# Patient Record
Sex: Female | Born: 1950 | Race: White | Hispanic: No | Marital: Married | Smoking: Never smoker
Health system: Southern US, Community
[De-identification: ages and names within clinical notes are randomized; demographics above are authoritative.]

## PROBLEM LIST (undated history)

## (undated) DIAGNOSIS — N200 Calculus of kidney: Secondary | ICD-10-CM

---

## 2021-08-09 ENCOUNTER — Observation Stay (HOSPITAL_COMMUNITY): Payer: Medicare Other | Admitting: Anesthesiology

## 2021-08-09 ENCOUNTER — Other Ambulatory Visit: Payer: Self-pay

## 2021-08-09 ENCOUNTER — Inpatient Hospital Stay (HOSPITAL_COMMUNITY)
Admission: EM | Admit: 2021-08-09 | Discharge: 2021-08-19 | DRG: 330 | Disposition: A | Discharge status: Home / Self Care | Payer: Medicare Other | Attending: General Surgery | Admitting: General Surgery

## 2021-08-09 ENCOUNTER — Emergency Department (HOSPITAL_COMMUNITY): Payer: Medicare Other

## 2021-08-09 ENCOUNTER — Encounter (HOSPITAL_COMMUNITY): Payer: Self-pay

## 2021-08-09 ENCOUNTER — Encounter (HOSPITAL_COMMUNITY): Admission: EM | Disposition: A | Discharge status: Home / Self Care | Payer: Self-pay | Source: Home / Self Care

## 2021-08-09 DIAGNOSIS — K3580 Unspecified acute appendicitis: Secondary | ICD-10-CM | POA: Diagnosis present

## 2021-08-09 DIAGNOSIS — I10 Essential (primary) hypertension: Secondary | ICD-10-CM | POA: Diagnosis present

## 2021-08-09 DIAGNOSIS — Z79899 Other long term (current) drug therapy: Secondary | ICD-10-CM | POA: Diagnosis not present

## 2021-08-09 DIAGNOSIS — N179 Acute kidney failure, unspecified: Secondary | ICD-10-CM | POA: Diagnosis present

## 2021-08-09 DIAGNOSIS — R9389 Abnormal findings on diagnostic imaging of other specified body structures: Secondary | ICD-10-CM | POA: Diagnosis present

## 2021-08-09 DIAGNOSIS — G4733 Obstructive sleep apnea (adult) (pediatric): Secondary | ICD-10-CM | POA: Diagnosis present

## 2021-08-09 DIAGNOSIS — Z87442 Personal history of urinary calculi: Secondary | ICD-10-CM | POA: Diagnosis not present

## 2021-08-09 DIAGNOSIS — K3532 Acute appendicitis with perforation and localized peritonitis, without abscess: Secondary | ICD-10-CM

## 2021-08-09 DIAGNOSIS — R911 Solitary pulmonary nodule: Secondary | ICD-10-CM | POA: Diagnosis present

## 2021-08-09 DIAGNOSIS — R159 Full incontinence of feces: Secondary | ICD-10-CM | POA: Diagnosis present

## 2021-08-09 DIAGNOSIS — Z20822 Contact with and (suspected) exposure to covid-19: Secondary | ICD-10-CM | POA: Diagnosis present

## 2021-08-09 DIAGNOSIS — Z5331 Laparoscopic surgical procedure converted to open procedure: Secondary | ICD-10-CM

## 2021-08-09 DIAGNOSIS — I700 Atherosclerosis of aorta: Secondary | ICD-10-CM | POA: Diagnosis present

## 2021-08-09 DIAGNOSIS — K5720 Diverticulitis of large intestine with perforation and abscess without bleeding: Secondary | ICD-10-CM | POA: Diagnosis present

## 2021-08-09 DIAGNOSIS — N739 Female pelvic inflammatory disease, unspecified: Secondary | ICD-10-CM | POA: Diagnosis not present

## 2021-08-09 DIAGNOSIS — K660 Peritoneal adhesions (postprocedural) (postinfection): Secondary | ICD-10-CM | POA: Diagnosis not present

## 2021-08-09 DIAGNOSIS — K5641 Fecal impaction: Secondary | ICD-10-CM | POA: Diagnosis present

## 2021-08-09 DIAGNOSIS — D62 Acute posthemorrhagic anemia: Secondary | ICD-10-CM | POA: Diagnosis not present

## 2021-08-09 DIAGNOSIS — K352 Acute appendicitis with generalized peritonitis, without abscess: Secondary | ICD-10-CM | POA: Diagnosis not present

## 2021-08-09 DIAGNOSIS — R188 Other ascites: Secondary | ICD-10-CM

## 2021-08-09 DIAGNOSIS — R109 Unspecified abdominal pain: Secondary | ICD-10-CM | POA: Diagnosis present

## 2021-08-09 HISTORY — DX: Calculus of kidney: N20.0

## 2021-08-09 HISTORY — PX: LAPAROSCOPIC APPENDECTOMY: SHX408

## 2021-08-09 LAB — COMPREHENSIVE METABOLIC PANEL
ALT: 14 U/L (ref 0–44)
AST: 16 U/L (ref 15–41)
Albumin: 3.6 g/dL (ref 3.5–5.0)
Alkaline Phosphatase: 87 U/L (ref 38–126)
Anion gap: 5 (ref 5–15)
BUN: 14 mg/dL (ref 8–23)
CO2: 25 mmol/L (ref 22–32)
Calcium: 9.4 mg/dL (ref 8.9–10.3)
Chloride: 104 mmol/L (ref 98–111)
Creatinine, Ser: 0.81 mg/dL (ref 0.44–1.00)
GFR, Estimated: >60 mL/min (ref >60)
Glucose, Bld: 174 mg/dL — ABNORMAL HIGH (ref 70–99)
Potassium: 3.7 mmol/L (ref 3.5–5.1)
Sodium: 134 mmol/L — ABNORMAL LOW (ref 135–145)
Total Bilirubin: 0.6 mg/dL (ref 0.3–1.2)
Total Protein: 6.9 g/dL (ref 6.5–8.1)

## 2021-08-09 LAB — URINALYSIS, ROUTINE W REFLEX MICROSCOPIC
Bilirubin Urine: NEGATIVE
Glucose, UA: NEGATIVE mg/dL
Hgb urine dipstick: NEGATIVE
Ketones, ur: NEGATIVE mg/dL
Nitrite: NEGATIVE
Protein, ur: 30 mg/dL — AB
Specific Gravity, Urine: >1.046 — ABNORMAL HIGH (ref 1.005–1.030)
pH: 8 (ref 5.0–8.0)

## 2021-08-09 LAB — CBC WITH DIFFERENTIAL/PLATELET
Abs Immature Granulocytes: 0.07 10*3/uL (ref 0.00–0.07)
Basophils Absolute: 0 10*3/uL (ref 0.0–0.1)
Basophils Relative: 0 %
Eosinophils Absolute: 0 10*3/uL (ref 0.0–0.5)
Eosinophils Relative: 0 %
HCT: 40.3 % (ref 36.0–46.0)
Hemoglobin: 12.5 g/dL (ref 12.0–15.0)
Immature Granulocytes: 1 %
Lymphocytes Relative: 9 %
Lymphs Abs: 1.2 10*3/uL (ref 0.7–4.0)
MCH: 24.1 pg — ABNORMAL LOW (ref 26.0–34.0)
MCHC: 31 g/dL (ref 30.0–36.0)
MCV: 77.6 fL — ABNORMAL LOW (ref 80.0–100.0)
Monocytes Absolute: 0.7 10*3/uL (ref 0.1–1.0)
Monocytes Relative: 5 %
Neutro Abs: 11.9 10*3/uL — ABNORMAL HIGH (ref 1.7–7.7)
Neutrophils Relative %: 85 %
Platelets: 453 10*3/uL — ABNORMAL HIGH (ref 150–400)
RBC: 5.19 MIL/uL — ABNORMAL HIGH (ref 3.87–5.11)
RDW: 15.3 % (ref 11.5–15.5)
WBC: 14 10*3/uL — ABNORMAL HIGH (ref 4.0–10.5)
nRBC: 0 % (ref 0.0–0.2)

## 2021-08-09 LAB — GLUCOSE, CAPILLARY: Glucose-Capillary: 150 mg/dL — ABNORMAL HIGH (ref 70–99)

## 2021-08-09 LAB — RESP PANEL BY RT-PCR (FLU A&B, COVID) ARPGX2
Influenza A by PCR: NEGATIVE
Influenza B by PCR: NEGATIVE
SARS Coronavirus 2 by RT PCR: NEGATIVE

## 2021-08-09 LAB — HIV ANTIBODY (ROUTINE TESTING W REFLEX): HIV Screen 4th Generation wRfx: NONREACTIVE

## 2021-08-09 SURGERY — APPENDECTOMY, LAPAROSCOPIC
Anesthesia: General | Site: Abdomen

## 2021-08-09 MED ORDER — ACETAMINOPHEN 500 MG PO TABS
1000.0000 mg | ORAL_TABLET | Freq: Four times a day (QID) | ORAL | Status: DC
  Administered 2021-08-09: 1000 mg via ORAL
  Filled 2021-08-09: qty 2

## 2021-08-09 MED ORDER — ACETAMINOPHEN 500 MG PO TABS
1000.0000 mg | ORAL_TABLET | Freq: Four times a day (QID) | ORAL | Status: DC
  Administered 2021-08-10 – 2021-08-19 (×23): 1000 mg via ORAL
  Filled 2021-08-09 (×25): qty 2

## 2021-08-09 MED ORDER — HYDROMORPHONE HCL 1 MG/ML IJ SOLN: 0.5000 mg | INTRAMUSCULAR | Status: DC | PRN

## 2021-08-09 MED ORDER — HYDROMORPHONE HCL 1 MG/ML IJ SOLN
INTRAMUSCULAR | Status: AC
  Administered 2021-08-09: 0.25 mg via INTRAVENOUS
  Filled 2021-08-09: qty 1

## 2021-08-09 MED ORDER — ENOXAPARIN SODIUM 40 MG/0.4ML IJ SOSY: 40.0000 mg | PREFILLED_SYRINGE | INTRAMUSCULAR | Status: DC

## 2021-08-09 MED ORDER — ONDANSETRON 4 MG PO TBDP: 4.0000 mg | ORAL_TABLET | Freq: Four times a day (QID) | ORAL | Status: DC | PRN

## 2021-08-09 MED ORDER — PROPOFOL 10 MG/ML IV BOLUS
INTRAVENOUS | Status: DC | PRN
Start: 2021-08-09 — End: 2021-08-09
  Administered 2021-08-09: 150 mg via INTRAVENOUS

## 2021-08-09 MED ORDER — MEPERIDINE HCL 50 MG/ML IJ SOLN: 6.2500 mg | INTRAMUSCULAR | Status: DC | PRN

## 2021-08-09 MED ORDER — KCL IN DEXTROSE-NACL 20-5-0.45 MEQ/L-%-% IV SOLN
INTRAVENOUS | Status: DC
  Filled 2021-08-09: qty 1000

## 2021-08-09 MED ORDER — HYDROMORPHONE HCL 1 MG/ML IJ SOLN
0.2500 mg | INTRAMUSCULAR | Status: DC | PRN
  Administered 2021-08-09: 0.25 mg via INTRAVENOUS

## 2021-08-09 MED ORDER — LACTATED RINGERS IV SOLN: INTRAVENOUS | Status: DC

## 2021-08-09 MED ORDER — ONDANSETRON HCL 4 MG/2ML IJ SOLN: 4.0000 mg | Freq: Four times a day (QID) | INTRAMUSCULAR | Status: DC | PRN

## 2021-08-09 MED ORDER — ACETAMINOPHEN 10 MG/ML IV SOLN
INTRAVENOUS | Status: DC | PRN
  Administered 2021-08-09: 1000 mg via INTRAVENOUS

## 2021-08-09 MED ORDER — IBUPROFEN 200 MG PO TABS: 400.0000 mg | ORAL_TABLET | Freq: Four times a day (QID) | ORAL | Status: DC | PRN

## 2021-08-09 MED ORDER — SODIUM CHLORIDE 0.9 % IV BOLUS
1000.0000 mL | Freq: Once | INTRAVENOUS | Status: AC
  Administered 2021-08-09: 1000 mL via INTRAVENOUS

## 2021-08-09 MED ORDER — MELATONIN 3 MG PO TABS: 3.0000 mg | ORAL_TABLET | Freq: Every evening | ORAL | Status: DC | PRN

## 2021-08-09 MED ORDER — MIDAZOLAM HCL 2 MG/2ML IJ SOLN
INTRAMUSCULAR | Status: AC
  Filled 2021-08-09: qty 2

## 2021-08-09 MED ORDER — OXYCODONE HCL 5 MG PO TABS: 5.0000 mg | ORAL_TABLET | Freq: Once | ORAL | Status: DC | PRN

## 2021-08-09 MED ORDER — ONDANSETRON HCL 4 MG/2ML IJ SOLN
4.0000 mg | Freq: Once | INTRAMUSCULAR | Status: AC
  Administered 2021-08-09: 4 mg via INTRAVENOUS
  Filled 2021-08-09: qty 2

## 2021-08-09 MED ORDER — IBUPROFEN 200 MG PO TABS
600.0000 mg | ORAL_TABLET | Freq: Four times a day (QID) | ORAL | Status: DC | PRN
  Administered 2021-08-10: 600 mg via ORAL
  Filled 2021-08-09 (×2): qty 3

## 2021-08-09 MED ORDER — DIPHENHYDRAMINE HCL 12.5 MG/5ML PO ELIX: 12.5000 mg | ORAL_SOLUTION | Freq: Four times a day (QID) | ORAL | Status: DC | PRN

## 2021-08-09 MED ORDER — MORPHINE SULFATE (PF) 4 MG/ML IV SOLN
4.0000 mg | Freq: Once | INTRAVENOUS | Status: AC
  Administered 2021-08-09: 4 mg via INTRAVENOUS
  Filled 2021-08-09: qty 1

## 2021-08-09 MED ORDER — ACETAMINOPHEN 10 MG/ML IV SOLN
INTRAVENOUS | Status: AC
  Filled 2021-08-09: qty 100

## 2021-08-09 MED ORDER — MIDAZOLAM HCL 2 MG/2ML IJ SOLN: 0.5000 mg | Freq: Once | INTRAMUSCULAR | Status: DC | PRN

## 2021-08-09 MED ORDER — BUPIVACAINE-EPINEPHRINE 0.25% -1:200000 IJ SOLN
INTRAMUSCULAR | Status: DC | PRN
Start: 2021-08-09 — End: 2021-08-09
  Administered 2021-08-09: 20 mL

## 2021-08-09 MED ORDER — SIMETHICONE 80 MG PO CHEW
40.0000 mg | CHEWABLE_TABLET | Freq: Four times a day (QID) | ORAL | Status: DC | PRN
  Administered 2021-08-10: 40 mg via ORAL
  Filled 2021-08-09: qty 1

## 2021-08-09 MED ORDER — IOHEXOL 300 MG/ML  SOLN
100.0000 mL | Freq: Once | INTRAMUSCULAR | Status: AC | PRN
  Administered 2021-08-09: 100 mL via INTRAVENOUS

## 2021-08-09 MED ORDER — PROMETHAZINE HCL 25 MG/ML IJ SOLN: 6.2500 mg | INTRAMUSCULAR | Status: DC | PRN

## 2021-08-09 MED ORDER — SODIUM CHLORIDE (PF) 0.9 % IJ SOLN
INTRAMUSCULAR | Status: AC
  Filled 2021-08-09: qty 50

## 2021-08-09 MED ORDER — ROCURONIUM BROMIDE 10 MG/ML (PF) SYRINGE
PREFILLED_SYRINGE | INTRAVENOUS | Status: DC | PRN
Start: 2021-08-09 — End: 2021-08-09
  Administered 2021-08-09: 50 mg via INTRAVENOUS
  Administered 2021-08-09: 20 mg via INTRAVENOUS

## 2021-08-09 MED ORDER — 0.9 % SODIUM CHLORIDE (POUR BTL) OPTIME
TOPICAL | Status: DC | PRN
  Administered 2021-08-09: 3000 mL

## 2021-08-09 MED ORDER — SUGAMMADEX SODIUM 200 MG/2ML IV SOLN
INTRAVENOUS | Status: DC | PRN
  Administered 2021-08-09: 200 mg via INTRAVENOUS

## 2021-08-09 MED ORDER — OXYCODONE HCL 5 MG PO TABS: 5.0000 mg | ORAL_TABLET | Freq: Four times a day (QID) | ORAL | Status: DC | PRN

## 2021-08-09 MED ORDER — PHENYLEPHRINE HCL-NACL 20-0.9 MG/250ML-% IV SOLN
INTRAVENOUS | Status: AC
  Filled 2021-08-09: qty 250

## 2021-08-09 MED ORDER — HEPARIN SODIUM (PORCINE) 5000 UNIT/ML IJ SOLN
5000.0000 [IU] | Freq: Three times a day (TID) | INTRAMUSCULAR | Status: DC
  Administered 2021-08-10 – 2021-08-17 (×22): 5000 [IU] via SUBCUTANEOUS
  Filled 2021-08-09 (×22): qty 1

## 2021-08-09 MED ORDER — DIPHENHYDRAMINE HCL 50 MG/ML IJ SOLN: 12.5000 mg | Freq: Four times a day (QID) | INTRAMUSCULAR | Status: DC | PRN

## 2021-08-09 MED ORDER — BUPIVACAINE-EPINEPHRINE (PF) 0.25% -1:200000 IJ SOLN
INTRAMUSCULAR | Status: AC
  Filled 2021-08-09: qty 30

## 2021-08-09 MED ORDER — METOPROLOL SUCCINATE ER 50 MG PO TB24
50.0000 mg | ORAL_TABLET | Freq: Every morning | ORAL | Status: DC
  Administered 2021-08-09: 50 mg via ORAL
  Filled 2021-08-09: qty 1

## 2021-08-09 MED ORDER — OXYCODONE HCL 5 MG PO TABS: 5.0000 mg | ORAL_TABLET | ORAL | Status: DC | PRN

## 2021-08-09 MED ORDER — OXYCODONE HCL 5 MG/5ML PO SOLN: 5.0000 mg | Freq: Once | ORAL | Status: DC | PRN

## 2021-08-09 MED ORDER — TRAMADOL HCL 50 MG PO TABS
50.0000 mg | ORAL_TABLET | Freq: Four times a day (QID) | ORAL | Status: DC | PRN
  Administered 2021-08-12 – 2021-08-15 (×8): 50 mg via ORAL
  Filled 2021-08-09 (×9): qty 1

## 2021-08-09 MED ORDER — PHENYLEPHRINE HCL-NACL 20-0.9 MG/250ML-% IV SOLN
INTRAVENOUS | Status: DC | PRN
  Administered 2021-08-09: 50 ug/min via INTRAVENOUS

## 2021-08-09 MED ORDER — CHLORHEXIDINE GLUCONATE 0.12 % MT SOLN
15.0000 mL | Freq: Once | OROMUCOSAL | Status: AC
  Administered 2021-08-09: 15 mL via OROMUCOSAL

## 2021-08-09 MED ORDER — HYDRALAZINE HCL 20 MG/ML IJ SOLN: 10.0000 mg | INTRAMUSCULAR | Status: DC | PRN

## 2021-08-09 MED ORDER — LISINOPRIL 10 MG PO TABS
40.0000 mg | ORAL_TABLET | Freq: Every morning | ORAL | Status: DC
  Administered 2021-08-09: 40 mg via ORAL
  Filled 2021-08-09: qty 4

## 2021-08-09 MED ORDER — PIPERACILLIN-TAZOBACTAM 3.375 G IVPB
3.3750 g | Freq: Three times a day (TID) | INTRAVENOUS | Status: DC
  Filled 2021-08-09: qty 50

## 2021-08-09 MED ORDER — MORPHINE SULFATE (PF) 2 MG/ML IV SOLN
2.0000 mg | INTRAVENOUS | Status: DC | PRN
  Administered 2021-08-09: 2 mg via INTRAVENOUS
  Filled 2021-08-09: qty 1

## 2021-08-09 MED ORDER — HYDRALAZINE HCL 20 MG/ML IJ SOLN: 10.0000 mg | Freq: Three times a day (TID) | INTRAMUSCULAR | Status: DC | PRN

## 2021-08-09 MED ORDER — PIPERACILLIN-TAZOBACTAM 3.375 G IVPB 30 MIN
3.3750 g | Freq: Once | INTRAVENOUS | Status: AC
  Administered 2021-08-09: 3.375 g via INTRAVENOUS
  Filled 2021-08-09: qty 50

## 2021-08-09 MED ORDER — METHOCARBAMOL 500 MG PO TABS
500.0000 mg | ORAL_TABLET | Freq: Four times a day (QID) | ORAL | Status: DC | PRN
  Filled 2021-08-09: qty 1

## 2021-08-09 MED ORDER — LIDOCAINE 2% (20 MG/ML) 5 ML SYRINGE: INTRAMUSCULAR | Status: DC | PRN

## 2021-08-09 MED ORDER — FENTANYL CITRATE (PF) 100 MCG/2ML IJ SOLN
INTRAMUSCULAR | Status: DC | PRN
  Administered 2021-08-09: 50 ug via INTRAVENOUS
  Administered 2021-08-09: 100 ug via INTRAVENOUS
  Administered 2021-08-09 (×2): 50 ug via INTRAVENOUS

## 2021-08-09 MED ORDER — FENTANYL CITRATE (PF) 250 MCG/5ML IJ SOLN
INTRAMUSCULAR | Status: AC
  Filled 2021-08-09: qty 5

## 2021-08-09 MED ORDER — CHLORHEXIDINE GLUCONATE CLOTH 2 % EX PADS: 6.0000 | MEDICATED_PAD | Freq: Every day | CUTANEOUS | Status: DC

## 2021-08-09 MED ORDER — ONDANSETRON HCL 4 MG/2ML IJ SOLN
INTRAMUSCULAR | Status: DC | PRN
  Administered 2021-08-09: 4 mg via INTRAVENOUS

## 2021-08-09 MED ORDER — METOPROLOL TARTRATE 5 MG/5ML IV SOLN: 5.0000 mg | Freq: Four times a day (QID) | INTRAVENOUS | Status: DC | PRN

## 2021-08-09 MED ORDER — SUCCINYLCHOLINE CHLORIDE 200 MG/10ML IV SOSY
PREFILLED_SYRINGE | INTRAVENOUS | Status: DC | PRN
  Administered 2021-08-09: 120 mg via INTRAVENOUS

## 2021-08-09 MED ORDER — LISINOPRIL 10 MG PO TABS: 40.0000 mg | ORAL_TABLET | Freq: Every morning | ORAL | Status: DC

## 2021-08-09 MED ORDER — DOCUSATE SODIUM 100 MG PO CAPS: 100.0000 mg | ORAL_CAPSULE | Freq: Two times a day (BID) | ORAL | Status: DC

## 2021-08-09 SURGICAL SUPPLY — 67 items
APPLIER CLIP 5 13 M/L LIGAMAX5 (MISCELLANEOUS)
APPLIER CLIP ROT 10 11.4 M/L (STAPLE)
BAG COUNTER SPONGE SURGICOUNT (BAG) IMPLANT
CABLE HIGH FREQUENCY MONO STRZ (ELECTRODE) IMPLANT
CHLORAPREP W/TINT 26 (MISCELLANEOUS) ×2 IMPLANT
CLIP APPLIE 5 13 M/L LIGAMAX5 (MISCELLANEOUS) IMPLANT
CLIP APPLIE ROT 10 11.4 M/L (STAPLE) IMPLANT
COVER SURGICAL LIGHT HANDLE (MISCELLANEOUS) ×2 IMPLANT
CUTTER FLEX LINEAR 45M (STAPLE) IMPLANT
DERMABOND ADVANCED (GAUZE/BANDAGES/DRESSINGS) ×1
DERMABOND ADVANCED .7 DNX12 (GAUZE/BANDAGES/DRESSINGS) ×1 IMPLANT
DRAIN CHANNEL 19F RND (DRAIN) ×1 IMPLANT
DRSG PAD ABDOMINAL 8X10 ST (GAUZE/BANDAGES/DRESSINGS) ×2 IMPLANT
DRSG TELFA 3X8 NADH (GAUZE/BANDAGES/DRESSINGS) ×4 IMPLANT
ELECT REM PT RETURN 15FT ADLT (MISCELLANEOUS) ×2 IMPLANT
ENDOLOOP SUT PDS II  0 18 (SUTURE)
ENDOLOOP SUT PDS II 0 18 (SUTURE) IMPLANT
EVACUATOR DRAINAGE 10X20 100CC (DRAIN) IMPLANT
EVACUATOR SILICONE 100CC (DRAIN) ×1 IMPLANT
GAUZE SPONGE 4X4 12PLY STRL (GAUZE/BANDAGES/DRESSINGS) ×1 IMPLANT
GLOVE SURG ENC MOIS LTX SZ6 (GLOVE) ×2 IMPLANT
GLOVE SURG MICRO LTX SZ6 (GLOVE) ×2 IMPLANT
GLOVE SURG UNDER LTX SZ6.5 (GLOVE) ×2 IMPLANT
GOWN STRL REUS W/ TWL LRG LVL3 (GOWN DISPOSABLE) ×1 IMPLANT
GOWN STRL REUS W/TWL LRG LVL3 (GOWN DISPOSABLE) ×1
GOWN STRL REUS W/TWL XL LVL3 (GOWN DISPOSABLE) ×2 IMPLANT
GRASPER SUT TROCAR 14GX15 (MISCELLANEOUS) IMPLANT
HANDLE SUCTION POOLE (INSTRUMENTS) IMPLANT
IRRIG SUCT STRYKERFLOW 2 WTIP (MISCELLANEOUS) ×2
IRRIGATION SUCT STRKRFLW 2 WTP (MISCELLANEOUS) ×1 IMPLANT
KIT BASIN OR (CUSTOM PROCEDURE TRAY) ×2 IMPLANT
KIT TURNOVER KIT A (KITS) IMPLANT
NDL INSUFFLATION 14GA 120MM (NEEDLE) ×1 IMPLANT
NEEDLE INSUFFLATION 14GA 120MM (NEEDLE) ×2 IMPLANT
PAD DRESSING TELFA 3X8 NADH (GAUZE/BANDAGES/DRESSINGS) IMPLANT
POUCH SPECIMEN RETRIEVAL 10MM (ENDOMECHANICALS) ×2 IMPLANT
RELOAD 45 VASCULAR/THIN (ENDOMECHANICALS) IMPLANT
RELOAD PROXIMATE 75MM BLUE (ENDOMECHANICALS) ×2 IMPLANT
RELOAD STAPLE 45 2.5 WHT GRN (ENDOMECHANICALS) IMPLANT
RELOAD STAPLE 45 3.5 BLU ETS (ENDOMECHANICALS) IMPLANT
RELOAD STAPLE 75 3.8 BLU REG (ENDOMECHANICALS) IMPLANT
RELOAD STAPLE TA45 3.5 REG BLU (ENDOMECHANICALS) IMPLANT
RETRACTOR WND ALEXIS 25 LRG (MISCELLANEOUS) IMPLANT
RTRCTR WOUND ALEXIS 25CM LRG (MISCELLANEOUS) ×2
SCISSORS LAP 5X35 DISP (ENDOMECHANICALS) IMPLANT
SEALER TISSUE G2 STRG ARTC 35C (ENDOMECHANICALS) ×1 IMPLANT
SET TUBE SMOKE EVAC HIGH FLOW (TUBING) ×2 IMPLANT
SHEARS HARMONIC ACE PLUS 36CM (ENDOMECHANICALS) ×2 IMPLANT
SLEEVE XCEL OPT CAN 5 100 (ENDOMECHANICALS) ×2 IMPLANT
SPIKE FLUID TRANSFER (MISCELLANEOUS) ×2 IMPLANT
STAPLER 90 3.5 STAND SLIM (STAPLE) ×2
STAPLER 90 3.5 STD SLIM (STAPLE) IMPLANT
STAPLER PROXIMATE 75MM BLUE (STAPLE) ×2 IMPLANT
STAPLER VISISTAT 35W (STAPLE) ×1 IMPLANT
SUCTION POOLE HANDLE (INSTRUMENTS) ×2
SUT ETHILON 2 0 PS N (SUTURE) ×1 IMPLANT
SUT MNCRL AB 4-0 PS2 18 (SUTURE) ×2 IMPLANT
SUT PDS AB 0 CTX 36 PDP370T (SUTURE) ×1 IMPLANT
TAPE CLOTH SURG 6X10 WHT LF (GAUZE/BANDAGES/DRESSINGS) ×1 IMPLANT
TOWEL OR 17X26 10 PK STRL BLUE (TOWEL DISPOSABLE) ×2 IMPLANT
TOWEL OR NON WOVEN STRL DISP B (DISPOSABLE) ×2 IMPLANT
TRAY FOLEY MTR SLVR 14FR STAT (SET/KITS/TRAYS/PACK) IMPLANT
TRAY FOLEY MTR SLVR 16FR STAT (SET/KITS/TRAYS/PACK) IMPLANT
TRAY LAPAROSCOPIC (CUSTOM PROCEDURE TRAY) ×2 IMPLANT
TROCAR BLADELESS OPT 5 100 (ENDOMECHANICALS) ×2 IMPLANT
TROCAR XCEL 12X100 BLDLESS (ENDOMECHANICALS) ×2 IMPLANT
TROCAR XCEL BLUNT TIP 100MML (ENDOMECHANICALS) ×1 IMPLANT

## 2021-08-09 NOTE — Progress Notes (Signed)
Pharmacy Note   A consult was received from an ED physician for Zosyn per pharmacy dosing.    The patient's profile has been reviewed for ht/wt/allergies/indication/available labs.    A one time order has been placed for Zosyn 3.375 gr IV x1 .    Further antibiotics/pharmacy consults should be ordered by admitting physician if indicated.                       Thank you,  Adalberto Cole, PharmD, BCPS 08/09/2021 11:08 AM

## 2021-08-09 NOTE — Transfer of Care (Signed)
Immediate Anesthesia Transfer of Care Note  Patient: Heidi Peterson  Procedure(s) Performed: LAPAROSCOPIC CONVERTED TO OPEN ILEOSEECTOMY (Abdomen)  Patient Location: PACU  Anesthesia Type:General  Level of Consciousness: drowsy and patient cooperative  Airway & Oxygen Therapy: Patient Spontanous Breathing and Patient connected to face mask oxygen  Post-op Assessment: Report given to RN and Post -op Vital signs reviewed and stable  Post vital signs: Reviewed and stable  Last Vitals:  Vitals Value Taken Time  BP 111/47 08/09/21 2024  Temp    Pulse 81 08/09/21 2029  Resp 17 08/09/21 2029  SpO2 98 % 08/09/21 2029  Vitals shown include unvalidated device data.  Last Pain:  Vitals:   08/09/21 1700  TempSrc: Oral  PainSc:          Complications: No notable events documented.

## 2021-08-09 NOTE — Op Note (Signed)
Heidi Peterson [id number]   PRE-OPERATIVE DIAGNOSIS:  Acute appendicitis, possible perforation  POST-OPERATIVE DIAGNOSIS:  Acute perforated appendicitis with feculent peritonitis   PROCEDURE:  Open ileocecectomy with ileocolic anastomosis Diagnostic laparoscopy  SURGEON:  Sharon [place] Harl, M.D.  ASSISTANT: OR staff  ANESTHESIA: General endotracheal  EBL:   50 mL  DRAINS: 22 Fr round blake drain left draining RLQ  SPECIMEN:  Terminal ileum, cecum with perforated appendix - all en bloc  COUNTS:  Sponge, needle and instrument counts were reported correct x2 at conclusion of the operation  DISPOSITION:  PACU in satisfactory condition  COMPLICATIONS: None  FINDINGS: Acute perforated appendicitis with feculent peritonitis.  There is a large gangrenous perforation at the base of the appendix resting upon the ileocecal valve.  This was therefore not amenable to appendectomy alone.  An ileocecectomy was necessary.  At least 10 free-floating fecaliths were found throughout the abdomen along with purulent and feculent peritoneal fluid.  DESCRIPTION:  The patient was identified & brought into the operating room. SCDs were in place and functioning. General endotracheal anesthesia was administered. Preoperative antibiotics were administered. The patient was positioned supine with left arm tucked. Hair on the abdomen was then clipped by the OR team. A foley catheter was inserted under sterile conditions. The abdomen was prepped and draped in the standard sterile fashion. A surgical timeout confirmed our plan.  A small incision was made in the supraumbilical skin. The subcutaneous tissue was dissected and the umbilical stalk identified. The stalk was grasped with a Kocher and retracted outwardly. The supraumbilical fascia was exposed and incised. Peritoneal entry was carefully made bluntly. A 0 Vicryl purse-string suture was placed and then the [facility] port was introduced into the  abdomen.  CO2 insufflation commenced to 69mmHg. The laparoscope was inserted and confirmed no evidence of trocar site complications. The patient was then positioned in Trendelenburg. Two additional ports were placed - one in left lower quadrant and another in the suprapubic midline taking care to stay well above the bladder - 3 fingerbreadths above the pubic symphysis. The bed was then slightly tilted to place the left side down.  It was immediately apparent that there is both feculent and purulent peritonitis with numerous loops of bowel with inflammatory type adhesions to the abdominal wall and additionally omentum mildly adherent with inflammatory lesions.  These were all able to be carefully swept down bluntly without any difficulty.  There are multiple free-floating subcentimeter appendicoliths throughout the abdomen.  There is both purulent and feculent fluid in the pelvis, right lower quadrant, left lower quadrant, right upper quadrant, left upper quadrant.  Much of this is evacuated with the suction irrigator device.  There is thin stool in the right lower quadrant that is green in color.  Upon entering the abdomen (organ space), I encountered feculent peritonitis.  CASE DATA:  Type of patient?: LDOW CASE (Surgical Hospitalist [facility] Inpatient)  Status of Case? EMERGENT Add On  Infection Present At Time Of Surgery (PATOS)?  FECULENT PERITONITIS   There is frank gangrene at the base of the appendix with associated inflammation and induration of the cecal wall.  This location overlies the ileocecal valve.  We are able to mobilize the appendix bluntly from its partially retrocecal position.  The perforation is then further inspected.  This is clearly at the base of the appendix.  This is not amenable to an appendectomy without coming across the ileocecal valve.  Therefore, an ileocecectomy is planned.  The terminal ileum is carefully  mobilized by incising the peritoneum overlying the mesentery and  freeing it from the right lower quadrant.  The cecum and ascending colon were then mobilized by incising the Daviyon Widmayer line of Toldt up to near the level of the hepatic flexure.  At this juncture, we have more than adequate reach of the cecum and appendix in the left lower quadrant. The mesentery is quite friable and fragile. Given this, the decision is made to complete this portion of the surgery open.   A low midline incision is created.  The subcutaneous tissues divided electrocautery.  The fascia is incised in the midline.  A wound protector was then placed.  The cecum is delivered up into the wound.  The ileum is identified.  Going to a portion that is approximately 5 to 7 cm proximal to the ileocecal valve, it is clearly healthy and supple at this location, a window was created the mesentery.  The ileum was divided with a GIA blue load stapler.  A Babcock clamp was then placed on the proximal end to assist in maintaining orientation.  The distal point of transection was identified on the proximal ascending colon.  A window was created the mesentery at this level and the colon is divided with a GIA blue load stapler.  The intervening mesentery was then ligated taking care to hug the colon and not devascularize her ascending colon.  This is done using a Enseal device.  The mesentery is inspected and noted to be hemostatic.  The specimen is passed off.  Multiple appendicoliths/stool balls are able to be removed from the abdomen.  The ascending colon is healthy and viable in appearance.  The ileum is also healthy and viable in appearance.  Attention is directed at creating an ileocolic anastomosis.  Orientation is confirmed such that there is no twisting of the ileum or associated mesentery.  A side-to-side, functional end-to-end ilio-ascending colon anastomosis is created using a 75 mm GIA blue load stapler.  The anastomosis is on the antimesenteric side of the small bowel and along the antimesenteric tenia of  the colon.  The anastomosis is intact and hemostatic in appearance.  A 3-0 silk is placed at the apex of the anastomosis.  The common enterotomy is then closed using a TA 90 blue load stapler after distracting the staple lines.  The TA staple line is inspected and hemostasis achieved at the mesenteric coronary using 3-0 silk sutures.  It is intact.  The corners of each TA staple line are then "dunked" using 3-0 silk suture.  The anastomosis is palpated and noted to be widely patent.  The mesenteric defect is then gently approximated using 3-0 silk suture.  This is placed back into the abdomen.  The abdomen is then irrigated with copious amounts of warm saline until the irrigant runs clear.  A 19 French round Blake drain is then placed in the right lower quadrant through a stab incision.  This is left draining the right paracolic gutter.  The left lower quadrant and suprapubic ports were removed and hemostatic. The umbilical fascia was then closed by closing the 0 Vicryl suture. The fascia was palpated and noted to be completely closed.  The midline fascia was then closed using 2 running #1 PDS sutures.  The fascia is palpated noted to be completely closed.  All sponge, needle, and instrument counts were reported correct. The 5 mm port sites were closed with skin staples.  The midline and umbilical wounds are partially approximated with staples.  Betadine soaked Telfa wicks were then placed between the staples given the contaminated nature of the procedure.   She was then awakened from general anesthesia, extubated, and transferred to a stretcher for transport to recover in satisfactory condition.

## 2021-08-09 NOTE — Progress Notes (Signed)
Pharmacy Antibiotic Note  Heidi Peterson is a 71 y.o. female admitted on 08/09/2021 with  intra-abdominal infection .  Pharmacy has been consulted for Zosyn dosing.  She is admitted with acute perforated appendicitis with feculent peritonitis s/p Open ileocecectomy with ileocolic anastomosis. Continue Zosyn post-op.    Plan: Zosyn 3.375g IV q8h (4 hour infusion). No dose adjustments anticipated.  Pharmacy will sign off and monitor peripherally via electronic surveillance software for any changes in renal function or micro data.   Height: 5\' 2"  (157.5 cm) Weight: 96.2 kg (212 lb) IBW/kg (Calculated) : 50.1  Temp (24hrs), Avg:98.2 F (36.8 C), Min:97.7 F (36.5 C), Max:99 F (37.2 C)  Recent Labs  Lab 08/09/21 0820  WBC 14.0*  CREATININE 0.81    Estimated Creatinine Clearance: 70.9 mL/min (by C-G formula based on SCr of 0.81 mg/dL).    No Known Allergies  Antimicrobials this admission: 2/20 Zosyn >>   Dose adjustments this admission:  Microbiology results:  Thank you for allowing pharmacy to be a part of this patients care.  3/20 PharmD 08/09/2021 8:46 PM

## 2021-08-09 NOTE — H&P (Addendum)
Heidi Peterson 03-09-1951  [id number].    Requesting MD: Dr. Audley HoseHong Chief Complaint/Reason for Consult: Acute Appendicitis   HPI: Heidi Peterson is a 71 y.o. female with a hx of HTN and kidney stones who [facility] with [facility].  1 week ago she was moving a mattress when she strained her back and has been having right lower back soreness since that time. This may have increased over the last few days. Yesterday she awoke with RLQ abdominal pain that was sharp, severe and constant.  Despite trying a heating pad + alternating with ibuprofen and Tylenol her pain worsened as the day progressed.  Notes associated decreased appetite and diarrhea.  Pain is worse with movement and p.o. intake.  No associated fever, chills, chest pain, shortness of breath or urinary symptoms.  No history of similar symptoms in the past.  No prior abdominal surgeries.  She is not on blood thinners. To note, patient's mother recently passed with funeral planned for 2/25.  ROS: Review of Systems  Constitutional:  Negative for chills and fever.  Respiratory:  Positive for cough. Negative for shortness of breath.   Cardiovascular:  Negative for chest pain and leg swelling.  Gastrointestinal:  Positive for abdominal pain and diarrhea. Negative for constipation, nausea and vomiting.  Genitourinary:  Negative for dysuria, frequency and hematuria.  Musculoskeletal:  Positive for back pain.  Psychiatric/Behavioral:  Negative for substance abuse.   All other systems reviewed and are negative.  History reviewed. No pertinent family history.  Past Medical History:  Diagnosis Date   Kidney stones     History reviewed. No pertinent surgical history.  Social History:  has no history on file for tobacco use, alcohol use, and drug use. Retired for 27 years.  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 rate 18, height 5\' 2"  (1.575 m), weight 96.2 kg, SpO2 97 %. General: pleasant, WD/WN white female who is laying in bed in NAD HEENT: head is normocephalic, atraumatic.  Sclera are noninjected.  PERRL.  Ears and nose without any masses or lesions.  Mouth is pink and moist. Dentition fair Heart: regular, rate, and rhythm.  Normal s1,s2. No obvious murmurs, gallops, or rubs noted.  Palpable pedal pulses bilaterally  Lungs: CTAB, no wheezes, rhonchi, or rales noted.  Respiratory effort nonlabored Abd: Soft, ND, RLQ tenderness without peritonitis, +BS. No masses, hernias, or organomegaly MS: no BUE/BLE edema, calves soft and nontender Skin: warm and dry with no masses, lesions, or rashes Psych: A&Ox4 with an appropriate affect Neuro: cranial nerves grossly intact, equal strength in BUE/BLE bilaterally, normal speech, thought process intact, moves all extremities, gait not assessed   Results for orders placed or performed during the hospital encounter of 08/09/21 (from the past 48 hour(s))  CBC with Differential     Status: Abnormal   Collection Time: 08/09/21  8:20 AM  Result Value Ref Range   WBC 14.0 (H) 4.0 - 10.5 K/uL   RBC 5.19 (H) 3.87 - 5.11 MIL/uL   Hemoglobin 12.5 12.0 - 15.0 g/dL   HCT 40.940.3 81.136.0 - 91.446.0 %   MCV 77.6 (L) 80.0 - 100.0 fL   MCH 24.1 (L) 26.0 -  34.0 pg   MCHC 31.0 30.0 - 36.0 g/dL   RDW 15.3 11.5 - 15.5 %   Platelets 453 (H) 150 - 400 K/uL   nRBC 0.0 0.0 - 0.2 %   Neutrophils Relative % 85 %   Neutro Abs 11.9 (H) 1.7 - 7.7 K/uL   Lymphocytes Relative 9 %   Lymphs Abs 1.2 0.7 - 4.0 K/uL   Monocytes Relative 5 %   Monocytes Absolute 0.7 0.1 - 1.0 K/uL   Eosinophils Relative 0 %   Eosinophils Absolute 0.0 0.0 - 0.5 K/uL   Basophils Relative 0 %   Basophils Absolute 0.0  0.0 - 0.1 K/uL   Immature Granulocytes 1 %   Abs Immature Granulocytes 0.07 0.00 - 0.07 K/uL    Comment: Performed at [facility], [place] [address], [place], [place] [address]  Comprehensive metabolic panel     Status: Abnormal   Collection Time: 08/09/21  8:20 AM  Result Value Ref Range   Sodium 134 (L) 135 - 145 mmol/L   Potassium 3.7 3.5 - 5.1 mmol/L   Chloride 104 98 - 111 mmol/L   CO2 25 22 - 32 mmol/L   Glucose, Bld 174 (H) 70 - 99 mg/dL    Comment: Glucose reference range applies only to samples taken after fasting for at least 8 hours.   BUN 14 8 - 23 mg/dL   Creatinine, Ser 0.81 0.44 - 1.00 mg/dL   Calcium 9.4 8.9 - 10.3 mg/dL   Total Protein 6.9 6.5 - 8.1 g/dL   Albumin 3.6 3.5 - 5.0 g/dL   AST 16 15 - 41 U/L   ALT 14 0 - 44 U/L   Alkaline Phosphatase 87 38 - 126 U/L   Total Bilirubin 0.6 0.3 - 1.2 mg/dL   GFR, Estimated >60 >60 mL/min    Comment: (NOTE) Calculated using the CKD-EPI Creatinine Equation (2021)    Anion gap 5 5 - 15    Comment: Performed at [facility], [place] [address] [place], [place] [address]  Urinalysis, Routine w reflex microscopic Urine, Clean Catch     Status: Abnormal   Collection Time: 08/09/21 10:52 AM  Result Value Ref Range   Color, Urine YELLOW YELLOW   APPearance CLEAR CLEAR   Specific Gravity, Urine >1.046 (H) 1.005 - 1.030   pH 8.0 5.0 - 8.0   Glucose, UA NEGATIVE NEGATIVE mg/dL   Hgb urine dipstick NEGATIVE NEGATIVE   Bilirubin Urine NEGATIVE NEGATIVE   Ketones, ur NEGATIVE NEGATIVE mg/dL   Protein, ur 30 (A) NEGATIVE mg/dL   Nitrite NEGATIVE NEGATIVE   Leukocytes,Ua MODERATE (A) NEGATIVE   RBC / HPF 6-10 0 - 5 RBC/hpf   WBC, UA 21-50 0 - 5 WBC/hpf   Bacteria, UA RARE (A) NONE SEEN   Squamous Epithelial / LPF 0-5 0 - 5   Mucus PRESENT     Comment: Performed at [facility], [place] [address], [place],  [address]   CT ABDOMEN PELVIS W CONTRAST  Result Date:  08/09/2021 CLINICAL DATA:  Right lower quadrant pain. EXAM: CT ABDOMEN AND PELVIS WITH CONTRAST TECHNIQUE: Multidetector CT imaging of the abdomen and pelvis was performed using the standard protocol following bolus administration of intravenous contrast. RADIATION DOSE REDUCTION: This exam was performed according to the departmental dose-optimization program which includes automated exposure control, adjustment of the mA and/or kV according to patient size and/or use of iterative reconstruction technique. CONTRAST:  147mL OMNIPAQUE IOHEXOL 300 MG/ML  SOLN COMPARISON:  None. FINDINGS: Lower chest: 3 mm pulmonary nodule in the right middle lobe. Subpleural densities in both lung bases suggesting fibrosis. Asymmetric scarring in the left lower lobe. No pleural effusion. Hepatobiliary: Diffusely decreased attenuation of the liver compatible with steatosis. 1.7 cm hypodensity in the lateral segment of the left hepatic lobe consistent with a cyst. Unremarkable gallbladder. No biliary dilatation. Pancreas: Unremarkable. Spleen: Subcentimeter hypodensity laterally in the spleen, too small to fully characterize. Adrenals/Urinary Tract: Unremarkable adrenal glands. Several renal calculi bilaterally measuring up to 6 mm. No hydronephrosis. 2.1 cm left renal cyst. Unremarkable bladder. Stomach/Bowel: There is a small sliding hiatal hernia. There is no evidence of bowel obstruction. Left-sided colonic diverticulosis is noted without evidence of acute diverticulitis. The appendix is fluid-filled, thick-walled, and dilated with a diameter of 1.5 cm. There is prominent surrounding inflammatory stranding and minimal free fluid. Multiple appendicoliths measure up to 7 mm. The wall of the appendix is poorly visualized proximally where there may be an extraluminal appendicolith suggesting possible perforation. Vascular/Lymphatic: Abdominal aortic atherosclerosis without aneurysm. Clustered right lower quadrant mesenteric lymph nodes  measuring up to 6 mm in short axis, likely reactive. Reproductive: Thickened appearance of the endometrium for age, estimated at 9 mm. Unremarkable ovaries. Other: Minimal intraperitoneal free fluid in the right lower quadrant and pelvis. No organized fluid collection or pneumoperitoneum. Musculoskeletal: Advanced lower lumbar facet arthrosis with grade 1 anterolisthesis of L4 on L5. IMPRESSION: 1. Acute, likely perforated appendicitis.  No organized abscess. 2. Hepatic steatosis. 3. Nonobstructing bilateral nephrolithiasis. 4. Thickened appearance of the endometrium for age. Recommend nonemergent pelvic ultrasound for further evaluation. 5. 3 mm right middle lobe pulmonary nodule. No follow-up needed if patient is low-risk. Non-contrast chest CT can be considered in 12 months if patient is high-risk. This recommendation follows the consensus statement: Guidelines for Management of Incidental Pulmonary Nodules Detected on CT Images: From the Fleischner Society 2017; Radiology 2017; 284:228-243. 6. Aortic Atherosclerosis (ICD10-I70.0). Electronically Signed   By: Logan Bores M.D.   On: 08/09/2021 10:48    Anti-infectives (From admission, onward)    Start     Dose/Rate Route Frequency Ordered Stop   08/09/21 1115  piperacillin-tazobactam (ZOSYN) IVPB 3.375 g        3.375 g 100 mL/hr over 30 Minutes Intravenous  Once 08/09/21 1105         Assessment/Plan Acute appendicitis with possible perforation - CT with acute appendicitis, with multiple appendicoliths and possible perforation.  No organized abscess.  Reviewed CT scan with my attending who would recommend proceeding with operative intervention.  Discussed with patient operative versus nonoperative therapy. Explained the procedure, risks, and aftercare of Appendectomy.  Risks include but are not limited to anesthesia (MI, CVA, death, aspiration), bleeding, infection, wound problems, hernia, injury to surrounding structure (bowel, blood vessels,  nerves, ureter), possible need for ileocectomy and/or drain placement.  Patient's mother's funeral is on Saturday about 6 hours away which she plans to travel by car.  We did discuss increased risk of DVT/PE with surgery as well as with prolonged immobilization during travel. We also discussed the risk of post-operative ileus or abscess that may require her to stay in the hospital for several days after her operation and could interfere with above plans. Patient stated understanding and would like to proceed with surgery. Cont IV Zosyn. Keep NPO. Admit to observation.   FEN - NPO, IVF VTE - SCDs, Lovenox ID - Zosyn Foley - None  HTN - home meds  Thickened  endometrium - recommend follow up with GYN outpatient  RML pulm nodule - nonsmoker. Recommend PCP f/u to see if any f/u imaging is needed  I reviewed  vital signs, pain score, labs, imaging, orders, most up to date pended ED provider note and nursing notes since presentation .  This care required high  level of medical decision making.   Jillyn Ledger, [facility] 08/09/2021, 11:32 AM Please see Amion for pager number during day hours 7:00am-4:30pm

## 2021-08-09 NOTE — ED Triage Notes (Signed)
Pt reports RLQ pain that began yesterday. She also endorses nausea and diarrhea. Pt reports pulling out her back about 2 weeks ago. Hx of kidney stones.

## 2021-08-09 NOTE — Anesthesia Preprocedure Evaluation (Addendum)
Anesthesia Evaluation  Patient identified by MRN, date of birth, ID band Patient awake    Reviewed: Allergy & Precautions, NPO status , Patient's Chart, lab work & pertinent test results, reviewed documented beta blocker date and time   Airway Mallampati: III  TM Distance: >3 FB Neck ROM: Full    Dental  (+) Dental Advisory Given, Teeth Intact   Pulmonary neg pulmonary ROS,    Pulmonary exam normal breath sounds clear to auscultation       Cardiovascular hypertension, Pt. on medications and Pt. on home beta blockers Normal cardiovascular exam Rhythm:Regular Rate:Normal     Neuro/Psych negative neurological ROS     GI/Hepatic negative GI ROS, Neg liver ROS,   Endo/Other  negative endocrine ROSMorbid obesity  Renal/GU Renal diseasestones     Musculoskeletal negative musculoskeletal ROS (+)   Abdominal (+) + obese,   Peds  Hematology negative hematology ROS (+)   Anesthesia Other Findings   Reproductive/Obstetrics                           Anesthesia Physical Anesthesia Plan  ASA: 2 and emergent  Anesthesia Plan: General   Post-op Pain Management: Tylenol PO (pre-op)*   Induction: Intravenous, Rapid sequence and Cricoid pressure planned  PONV Risk Score and Plan: 3 and Ondansetron, Dexamethasone and Treatment may vary due to age or medical condition  Airway Management Planned: Oral ETT  Additional Equipment: None  Intra-op Plan:   Post-operative Plan: Extubation in OR  Informed Consent: I have reviewed the patients History and Physical, chart, labs and discussed the procedure including the risks, benefits and alternatives for the proposed anesthesia with the patient or authorized representative who has indicated his/her understanding and acceptance.     Dental advisory given  Plan Discussed with: CRNA  Anesthesia Plan Comments:      Anesthesia Quick Evaluation

## 2021-08-09 NOTE — Progress Notes (Signed)
I have seen and examined her as well. I have reviewed her CT scan and the findings with her.  AF VSS NAD, comfortable RRR Abdomen is +RLQ tenderness; nondistended. No rebound; +RLQ guarding No pitting edema  -The anatomy and physiology of the GI tract was discussed at length with the patient. The pathophysiology of appendicitis was discussed as well. -We reviewed options moving forward for treatment, covering IV abx vs surgery. We discussed that with antibiotics alone, there is reasonable success in managing appendicitis, however, risks of recurrence at 45yrs being as high as 40% in some studies; additionally, her having an appendicolith predicting higher risk of non-operative failure. We discussed appendectomy - laparoscopic and potential open techniques as well as scenarios where an ileocecectomy could be necessary. We discussed the material risks (including, but not limited to, pain, bleeding, infection, scarring, need for blood transfusion, damage to surrounding structures- blood vessels/nerves/viscus/organs, damage to ureter/bladder, urine leak, leak from staple line, need for additional procedures, hernia, recurrence although quite low, pneumonia, heart attack, stroke, death) benefits and alternatives to surgery were discussed. The patient's questions were answered to her satisfaction, she voiced understanding and elected to proceed with surgery. Additionally, we discussed typical postoperative expectations and the recovery process.  Nadeen Landau, MD [facility], [facility]

## 2021-08-09 NOTE — ED Provider Notes (Signed)
[facility] DEPT Provider Note   CSN: [id number] Arrival date & time: 08/09/21  [id number]     History  Chief Complaint  Patient presents with   Abdominal Pain    Heidi Peterson is a 71 y.o. female.  Quadrant pain.  Describes sharp and aching started yesterday.  Its been persistent since then.  Associated with some nausea and loose stools.  Denies fevers cough denies vomiting.      Home Medications Prior to Admission medications   Medication Sig Start Date End Date Taking? Authorizing Provider  acetaminophen (TYLENOL) 500 MG tablet Take 500 mg by mouth every 8 (eight) hours as needed (pain).   Yes [provider]  ASPERCREME LIDOCAINE EX Apply 1 application topically at bedtime as needed (knee pain/arthritis).   Yes [provider]  BIOTIN PO Take 1 tablet by mouth every morning.   Yes [provider]  Glucosamine-Chondroitin (OSTEO BI-FLEX REGULAR STRENGTH PO) Take 1 tablet by mouth at bedtime.   Yes [provider]  lisinopril (ZESTRIL) 40 MG tablet Take 40 mg by mouth every morning.   Yes [provider]  metoprolol succinate (TOPROL-XL) 50 MG 24 hr tablet Take 50 mg by mouth every morning. Take with or immediately following a meal.   Yes [provider]  Multiple Vitamin (MULTIVITAMIN WITH MINERALS) TABS tablet Take 1 tablet by mouth every morning.   Yes [provider]  naproxen sodium (ALEVE) 220 MG tablet Take 220 mg by mouth every 8 (eight) hours as needed (pain).   Yes [provider]  Omega-3 Fatty Acids (OMEGA-3 PO) Take 1 capsule by mouth every morning.   Yes [provider]  polyvinyl alcohol (ARTIFICIAL TEARS) 1.4 % ophthalmic solution Place 1 drop into both eyes at bedtime as needed for dry eyes.   Yes [provider]  PRESCRIPTION MEDICATION Inhale into the lungs See admin instructions. CPAP - use at night and whenever resting   Yes [provider]      Allergies    Patient has no known allergies.    Review of Systems   Review of Systems  Constitutional:  Negative for fever.  HENT:  Negative for ear pain.   Eyes:  Negative for pain.  Respiratory:  Negative for cough.   Cardiovascular:  Negative for chest pain.  Gastrointestinal:  Positive for abdominal pain.  Genitourinary:  Negative for flank pain.  Musculoskeletal:  Negative for back pain.  Skin:  Negative for rash.  Neurological:  Negative for headaches.   Physical Exam Updated Vital Signs BP (!) 120/59    Pulse 79    Temp 97.9 F (36.6 C) (Oral)    Resp 16    Ht 5\' 2"  (1.575 m)    Wt 96.2 kg    SpO2 94%    BMI 38.78 kg/m  Physical Exam Constitutional:      General: She is not in acute distress.    Appearance: Normal appearance.  HENT:     Head: Normocephalic.     Nose: Nose normal.  Eyes:     Extraocular Movements: Extraocular movements intact.  Cardiovascular:     Rate and Rhythm: Normal rate.  Pulmonary:     Effort: Pulmonary effort is normal.  Abdominal:     Tenderness: There is abdominal tenderness.     Comments: Right lower quadrant tenderness to palpation.  Positive guarding.  Musculoskeletal:        General: Normal range of motion.  Cervical back: Normal range of motion.  Neurological:     General: No focal deficit present.     Mental Status: She is alert. Mental status is at baseline.    ED Results / Procedures / Treatments   Labs (all labs ordered are listed, but only abnormal results are displayed) Labs Reviewed  CBC WITH DIFFERENTIAL/PLATELET - Abnormal; Notable for the following components:      Result Value   WBC 14.0 (*)    RBC 5.19 (*)    MCV 77.6 (*)    MCH 24.1 (*)    Platelets 453 (*)    Neutro Abs 11.9 (*)    All other components within normal limits  COMPREHENSIVE METABOLIC PANEL - Abnormal; Notable for the following components:   Sodium 134 (*)    Glucose, Bld 174 (*)    All other components within normal  limits  URINALYSIS, ROUTINE W REFLEX MICROSCOPIC - Abnormal; Notable for the following components:   Specific Gravity, Urine >1.046 (*)    Protein, ur 30 (*)    Leukocytes,Ua MODERATE (*)    Bacteria, UA RARE (*)    All other components within normal limits  RESP PANEL BY RT-PCR (FLU A&B, COVID) ARPGX2  HIV ANTIBODY (ROUTINE TESTING W REFLEX)    EKG None  Radiology CT ABDOMEN PELVIS W CONTRAST  Result Date: 08/09/2021 CLINICAL DATA:  Right lower quadrant pain. EXAM: CT ABDOMEN AND PELVIS WITH CONTRAST TECHNIQUE: Multidetector CT imaging of the abdomen and pelvis was performed using the standard protocol following bolus administration of intravenous contrast. RADIATION DOSE REDUCTION: This exam was performed according to the departmental dose-optimization program which includes automated exposure control, adjustment of the mA and/or kV according to patient size and/or use of iterative reconstruction technique. CONTRAST:  141mL OMNIPAQUE IOHEXOL 300 MG/ML  SOLN COMPARISON:  None. FINDINGS: Lower chest: 3 mm pulmonary nodule in the right middle lobe. Subpleural densities in both lung bases suggesting fibrosis. Asymmetric scarring in the left lower lobe. No pleural effusion. Hepatobiliary: Diffusely decreased attenuation of the liver compatible with steatosis. 1.7 cm hypodensity in the lateral segment of the left hepatic lobe consistent with a cyst. Unremarkable gallbladder. No biliary dilatation. Pancreas: Unremarkable. Spleen: Subcentimeter hypodensity laterally in the spleen, too small to fully characterize. Adrenals/Urinary Tract: Unremarkable adrenal glands. Several renal calculi bilaterally measuring up to 6 mm. No hydronephrosis. 2.1 cm left renal cyst. Unremarkable bladder. Stomach/Bowel: There is a small sliding hiatal hernia. There is no evidence of bowel obstruction. Left-sided colonic diverticulosis is noted without evidence of acute diverticulitis. The appendix is fluid-filled,  thick-walled, and dilated with a diameter of 1.5 cm. There is prominent surrounding inflammatory stranding and minimal free fluid. Multiple appendicoliths measure up to 7 mm. The wall of the appendix is poorly visualized proximally where there may be an extraluminal appendicolith suggesting possible perforation. Vascular/Lymphatic: Abdominal aortic atherosclerosis without aneurysm. Clustered right lower quadrant mesenteric lymph nodes measuring up to 6 mm in short axis, likely reactive. Reproductive: Thickened appearance of the endometrium for age, estimated at 63 mm. Unremarkable ovaries. Other: Minimal intraperitoneal free fluid in the right lower quadrant and pelvis. No organized fluid collection or pneumoperitoneum. Musculoskeletal: Advanced lower lumbar facet arthrosis with grade 1 anterolisthesis of L4 on L5. IMPRESSION: 1. Acute, likely perforated appendicitis.  No organized abscess. 2. Hepatic steatosis. 3. Nonobstructing bilateral nephrolithiasis. 4. Thickened appearance of the endometrium for age. Recommend nonemergent pelvic ultrasound for further evaluation. 5. 3 mm right middle lobe pulmonary nodule. No follow-up needed if  patient is low-risk. Non-contrast chest CT can be considered in 12 months if patient is high-risk. This recommendation follows the consensus statement: Guidelines for Management of Incidental Pulmonary Nodules Detected on CT Images: From the Fleischner Society 2017; Radiology 2017; 284:228-243. 6. Aortic Atherosclerosis (ICD10-I70.0). Electronically Signed   By: Logan Bores M.D.   On: 08/09/2021 10:48    Procedures Procedures    Medications Ordered in ED Medications  sodium chloride (PF) 0.9 % injection (has no administration in time range)  enoxaparin (LOVENOX) injection 40 mg (has no administration in time range)  dextrose 5 % and 0.45 % NaCl with KCl 20 mEq/L infusion ( Intravenous New Bag/Given 08/09/21 1239)  piperacillin-tazobactam (ZOSYN) IVPB 3.375 g (has no  administration in time range)  acetaminophen (TYLENOL) tablet 1,000 mg (1,000 mg Oral Given 08/09/21 1237)  ibuprofen (ADVIL) tablet 400 mg (has no administration in time range)  morphine (PF) 2 MG/ML injection 2-4 mg (has no administration in time range)  diphenhydrAMINE (BENADRYL) 12.5 MG/5ML elixir 12.5 mg (has no administration in time range)    Or  diphenhydrAMINE (BENADRYL) injection 12.5 mg (has no administration in time range)  docusate sodium (COLACE) capsule 100 mg (has no administration in time range)  ondansetron (ZOFRAN-ODT) disintegrating tablet 4 mg (has no administration in time range)    Or  ondansetron (ZOFRAN) injection 4 mg (has no administration in time range)  oxyCODONE (Oxy IR/ROXICODONE) immediate release tablet 5-10 mg (has no administration in time range)  metoprolol succinate (TOPROL-XL) 24 hr tablet 50 mg (has no administration in time range)  hydrALAZINE (APRESOLINE) injection 10 mg (has no administration in time range)  lisinopril (ZESTRIL) tablet 40 mg (has no administration in time range)  morphine (PF) 4 MG/ML injection 4 mg (4 mg Intravenous Given 08/09/21 0831)  ondansetron (ZOFRAN) injection 4 mg (4 mg Intravenous Given 08/09/21 0831)  iohexol (OMNIPAQUE) 300 MG/ML solution 100 mL (100 mLs Intravenous Contrast Given 08/09/21 1009)  morphine (PF) 4 MG/ML injection 4 mg (4 mg Intravenous Given 08/09/21 1110)  piperacillin-tazobactam (ZOSYN) IVPB 3.375 g (0 g Intravenous Stopped 08/09/21 1149)    ED Course/ Medical Decision Making/ A&P                           Medical Decision Making Amount and/or Complexity of Data Reviewed Labs: ordered. Radiology: ordered.  Risk Prescription drug management. Decision regarding hospitalization.   Chart review shows no record of prior visits.  Labs are sent white count of 14 hemoglobin 12.  CT abdomen pelvis pursued.  Patient given IV morphine and Zofran for pain management.  CT on pelvis concerning for acute  appendicitis, concern for rupture present per radiology.  Case discussed with surgery, who will admit the patient to their service.        Final Clinical Impression(s) / ED Diagnoses Final diagnoses:  Acute appendicitis, unspecified acute appendicitis type    Rx / DC Orders ED Discharge Orders     None         Luna Fuse, MD 08/09/21 1408

## 2021-08-09 NOTE — Anesthesia Postprocedure Evaluation (Signed)
Anesthesia Post Note  Patient: Paulett Kaufhold  Procedure(s) Performed: LAPAROSCOPIC CONVERTED TO OPEN ILEOSEECTOMY (Abdomen)     Patient location during evaluation: PACU Anesthesia Type: General Level of consciousness: sedated and patient cooperative Pain management: pain level controlled Vital Signs Assessment: post-procedure vital signs reviewed and stable Respiratory status: spontaneous breathing Cardiovascular status: stable Anesthetic complications: no   No notable events documented.  Last Vitals:  Vitals:   08/09/21 2145 08/09/21 2200  BP: (!) 119/51 (!) 109/49  Pulse: 73 68  Resp: 17 16  Temp:    SpO2: 93% 93%    Last Pain:  Vitals:   08/09/21 2145  TempSrc:   PainSc: 4                  Lewie Loron

## 2021-08-09 NOTE — Anesthesia Procedure Notes (Signed)
Procedure Name: Intubation Date/Time: 08/09/2021 6:03 PM Performed by: Ponciano Ort, CRNA Pre-anesthesia Checklist: Patient identified, Emergency Drugs available, Suction available and Patient being monitored Patient Re-evaluated:Patient Re-evaluated prior to induction Oxygen Delivery Method: Circle system utilized Preoxygenation: Pre-oxygenation with 100% oxygen Induction Type: IV induction, Rapid sequence and Cricoid Pressure applied Laryngoscope Size: Glidescope and 3 Grade View: Grade I Tube type: Oral Number of attempts: 1 Airway Equipment and Method: Stylet Placement Confirmation: ETT inserted through vocal cords under direct vision, positive ETCO2 and breath sounds checked- equal and bilateral Secured at: 21 cm Tube secured with: Tape Dental Injury: Teeth and Oropharynx as per pre-operative assessment  Difficulty Due To: Difficulty was anticipated and Difficult Airway- due to limited oral opening Future Recommendations: Recommend- induction with short-acting agent, and alternative techniques readily available

## 2021-08-10 ENCOUNTER — Encounter (HOSPITAL_COMMUNITY): Payer: Self-pay

## 2021-08-10 LAB — BASIC METABOLIC PANEL
Anion gap: 5 (ref 5–15)
BUN: 22 mg/dL (ref 8–23)
CO2: 26 mmol/L (ref 22–32)
Calcium: 9.3 mg/dL (ref 8.9–10.3)
Chloride: 103 mmol/L (ref 98–111)
Creatinine, Ser: 1.14 mg/dL — ABNORMAL HIGH (ref 0.44–1.00)
GFR, Estimated: 52 mL/min — ABNORMAL LOW (ref >60)
Glucose, Bld: 172 mg/dL — ABNORMAL HIGH (ref 70–99)
Potassium: 4.3 mmol/L (ref 3.5–5.1)
Sodium: 134 mmol/L — ABNORMAL LOW (ref 135–145)

## 2021-08-10 LAB — CBC
HCT: 36 % (ref 36.0–46.0)
Hemoglobin: 11 g/dL — ABNORMAL LOW (ref 12.0–15.0)
MCH: 24.3 pg — ABNORMAL LOW (ref 26.0–34.0)
MCHC: 30.6 g/dL (ref 30.0–36.0)
MCV: 79.6 fL — ABNORMAL LOW (ref 80.0–100.0)
Platelets: 375 10*3/uL (ref 150–400)
RBC: 4.52 MIL/uL (ref 3.87–5.11)
RDW: 15.9 % — ABNORMAL HIGH (ref 11.5–15.5)
WBC: 15.5 10*3/uL — ABNORMAL HIGH (ref 4.0–10.5)
nRBC: 0 % (ref 0.0–0.2)

## 2021-08-10 MED ORDER — HYDROMORPHONE HCL 1 MG/ML IJ SOLN
0.5000 mg | INTRAMUSCULAR | Status: DC | PRN
  Administered 2021-08-16: 1 mg via INTRAVENOUS
  Filled 2021-08-10: qty 1

## 2021-08-10 MED ORDER — PIPERACILLIN-TAZOBACTAM 3.375 G IVPB
3.3750 g | Freq: Three times a day (TID) | INTRAVENOUS | Status: DC
  Filled 2021-08-10 (×2): qty 50

## 2021-08-10 MED ORDER — PIPERACILLIN-TAZOBACTAM 3.375 G IVPB 30 MIN: 3.3750 g | Freq: Three times a day (TID) | INTRAVENOUS | Status: DC

## 2021-08-10 MED ORDER — PIPERACILLIN-TAZOBACTAM 3.375 G IVPB
3.3750 g | Freq: Three times a day (TID) | INTRAVENOUS | Status: DC
  Administered 2021-08-10 – 2021-08-19 (×27): 3.375 g via INTRAVENOUS
  Filled 2021-08-10 (×29): qty 50

## 2021-08-10 NOTE — Plan of Care (Signed)
Plan of care discussed.   

## 2021-08-10 NOTE — Progress Notes (Addendum)
°  Transition of Care [facility]) Screening Note   Patient Details  Name: Heidi Peterson Date of Birth: 06-25-1950   Transition of Care [facility]) CM/SW Contact:    Amada Jupiter, LCSW Phone Number: 08/10/2021, 9:37 AM    Transition of Care Department [facility]) has reviewed patient and no TOC needs have been identified at this time. We will continue to monitor patient advancement through interdisciplinary progression rounds. If new patient transition needs arise, please place a TOC consult.  ADDENDUM: provided pt with "Healthgrades" listing of local PCPs accepting new patients with traditional Medicare.  No further TOC needs.

## 2021-08-10 NOTE — Progress Notes (Signed)
Pt set up on cpap. ?

## 2021-08-10 NOTE — Progress Notes (Signed)
1 Day Post-Op  Subjective: CC Patient reports some soreness to her abdomen, mainly around her drain and incision.  Having some hiccups.  Denies any burping/belching, nausea or vomiting.  No flatus or BM.  Some soft BPs.  Home BP meds have been held.  She is on 100 cc IV fluid.  She has gotten to the edge of bed and stood but not ambulated.  Foley in place with clear yellow urine in canister.  Wears CPAP at night.  On 1 L O2 when I walked in the room.  Was able to wean this off while in the room.  She denies any chest pain or shortness of breath.  Patient does not have a PCP. She just moved from [place] to [place] in June of last year after getting married.   Objective: Vital signs in last 24 hours: Temp:  [97.4 F (36.3 C)-99 F (37.2 C)] 97.7 F (36.5 C) (02/21 0448) Pulse Rate:  [54-88] 58 (02/21 0448) Resp:  [16-20] 17 (02/21 0448) BP: (85-139)/(45-72) 101/50 (02/21 0448) SpO2:  [89 %-99 %] 93 % (02/21 0448) Weight:  [96.2 kg] 96.2 kg (02/20 1113) Last BM Date : 08/08/21  Intake/Output from previous day: 02/20 0701 - 02/21 0700 In: 3556.8 [P.O.:270; I.V.:3086.8; IV Piggyback:200] Out: 785 [Urine:625; Drains:110; Blood:50] Intake/Output this shift: No intake/output data recorded.  PE: Gen:  Alert, NAD, pleasant Card:  RRR Pulm:  CTAB, no W/R/R, effort normal. Now on RA.  Abd: Soft, obese, mildly distended, appropriately tender around incisions and drain - otherwise NT. Hypoactive BS. Laparoscopic incisions and midline incisions with staples in place and are c/d/I. Midline incision and supraumbilical laparoscopic incision has betadine soaked wicks between staples. JP drain w/ bloody/SS output - 110cc since surgery Ext:  No LE edema or calf tenderness Psych: A&Ox3  Skin: no rashes noted, warm and dry  Lab Results:  Recent Labs    08/09/21 0820 08/10/21 0835  WBC 14.0* 15.5*  HGB 12.5 11.0*  HCT 40.3 36.0  PLT 453* 375   BMET Recent Labs    08/09/21 0820  08/10/21 0835  NA 134* 134*  K 3.7 4.3  CL 104 103  CO2 25 26  GLUCOSE 174* 172*  BUN 14 22  CREATININE 0.81 1.14*  CALCIUM 9.4 9.3   PT/INR No results for input(s): LABPROT, INR in the last 72 hours. CMP     Component Value Date/Time   NA 134 (L) 08/10/2021 0835   K 4.3 08/10/2021 0835   CL 103 08/10/2021 0835   CO2 26 08/10/2021 0835   GLUCOSE 172 (H) 08/10/2021 0835   BUN 22 08/10/2021 0835   CREATININE 1.14 (H) 08/10/2021 0835   CALCIUM 9.3 08/10/2021 0835   PROT 6.9 08/09/2021 0820   ALBUMIN 3.6 08/09/2021 0820   AST 16 08/09/2021 0820   ALT 14 08/09/2021 0820   ALKPHOS 87 08/09/2021 0820   BILITOT 0.6 08/09/2021 0820   GFRNONAA 52 (L) 08/10/2021 0835   Lipase  No results found for: LIPASE  Studies/Results: CT ABDOMEN PELVIS W CONTRAST  Result Date: 08/09/2021 CLINICAL DATA:  Right lower quadrant pain. EXAM: CT ABDOMEN AND PELVIS WITH CONTRAST TECHNIQUE: Multidetector CT imaging of the abdomen and pelvis was performed using the standard protocol following bolus administration of intravenous contrast. RADIATION DOSE REDUCTION: This exam was performed according to the departmental dose-optimization program which includes automated exposure control, adjustment of the mA and/or kV according to patient size and/or use of iterative reconstruction technique. CONTRAST:  158mL OMNIPAQUE IOHEXOL 300 MG/ML  SOLN COMPARISON:  None. FINDINGS: Lower chest: 3 mm pulmonary nodule in the right middle lobe. Subpleural densities in both lung bases suggesting fibrosis. Asymmetric scarring in the left lower lobe. No pleural effusion. Hepatobiliary: Diffusely decreased attenuation of the liver compatible with steatosis. 1.7 cm hypodensity in the lateral segment of the left hepatic lobe consistent with a cyst. Unremarkable gallbladder. No biliary dilatation. Pancreas: Unremarkable. Spleen: Subcentimeter hypodensity laterally in the spleen, too small to fully characterize. Adrenals/Urinary Tract:  Unremarkable adrenal glands. Several renal calculi bilaterally measuring up to 6 mm. No hydronephrosis. 2.1 cm left renal cyst. Unremarkable bladder. Stomach/Bowel: There is a small sliding hiatal hernia. There is no evidence of bowel obstruction. Left-sided colonic diverticulosis is noted without evidence of acute diverticulitis. The appendix is fluid-filled, thick-walled, and dilated with a diameter of 1.5 cm. There is prominent surrounding inflammatory stranding and minimal free fluid. Multiple appendicoliths measure up to 7 mm. The wall of the appendix is poorly visualized proximally where there may be an extraluminal appendicolith suggesting possible perforation. Vascular/Lymphatic: Abdominal aortic atherosclerosis without aneurysm. Clustered right lower quadrant mesenteric lymph nodes measuring up to 6 mm in short axis, likely reactive. Reproductive: Thickened appearance of the endometrium for age, estimated at 92 mm. Unremarkable ovaries. Other: Minimal intraperitoneal free fluid in the right lower quadrant and pelvis. No organized fluid collection or pneumoperitoneum. Musculoskeletal: Advanced lower lumbar facet arthrosis with grade 1 anterolisthesis of L4 on L5. IMPRESSION: 1. Acute, likely perforated appendicitis.  No organized abscess. 2. Hepatic steatosis. 3. Nonobstructing bilateral nephrolithiasis. 4. Thickened appearance of the endometrium for age. Recommend nonemergent pelvic ultrasound for further evaluation. 5. 3 mm right middle lobe pulmonary nodule. No follow-up needed if patient is low-risk. Non-contrast chest CT can be considered in 12 months if patient is high-risk. This recommendation follows the consensus statement: Guidelines for Management of Incidental Pulmonary Nodules Detected on CT Images: From the Fleischner Society 2017; Radiology 2017; 284:228-243. 6. Aortic Atherosclerosis (ICD10-I70.0). Electronically Signed   By: Logan Bores M.D.   On: 08/09/2021 10:48     Anti-infectives: Anti-infectives (From admission, onward)    Start     Dose/Rate Route Frequency Ordered Stop   08/10/21 0915  piperacillin-tazobactam (ZOSYN) IVPB 3.375 g        3.375 g 12.5 mL/hr over 240 Minutes Intravenous Every 8 hours 08/10/21 0815     08/10/21 0900  piperacillin-tazobactam (ZOSYN) IVPB 3.375 g  Status:  Discontinued        3.375 g 100 mL/hr over 30 Minutes Intravenous Every 8 hours 08/10/21 0810 08/10/21 0815   08/09/21 2000  piperacillin-tazobactam (ZOSYN) IVPB 3.375 g  Status:  Discontinued        3.375 g 12.5 mL/hr over 240 Minutes Intravenous Every 8 hours 08/09/21 1146 08/09/21 2210   08/09/21 1115  piperacillin-tazobactam (ZOSYN) IVPB 3.375 g        3.375 g 100 mL/hr over 30 Minutes Intravenous  Once 08/09/21 1105 08/09/21 1149        Assessment/Plan POD 1 s/p diagnostic laparoscopy converted to open ileocecectomy with ileocolic anastomosis for Acute perforated appendicitis with feculent peritonitis by Dr. Dema Severin on 08/09/21 - Cont IV abx 5d post op - Cont JP drain. Currently SS - Okay w/ sips of clears. At risk for ileus - Cont wicks. Plan to change in 1-2 days.  - AM labs.  - Mobilize. PT consult - Pulm toilet - D/c foley  FEN - Sips of clears, IVF @ 125cc/hr.  K 4.3 VTE - SCDs, subq heparin ID - Zosyn 2/20 >> Afebrile. WBC 15.5 Foley - D/c today. TOV  ABL anemia - hgb 11.0 from 12.5. AM labs.  AKI - Cr 1.14 from 0.81. Inc IVF to 125cc/hr (no hx HF). D/c Ibuprofen. Recheck BMP in AM.  OSA - CPAP at night. Weaned to RA. Denies CP or SOB HTN - home meds on hold with soft BP yesterday. Monitor.  Thickened endometrium - recommend follow up with GYN outpatient. Discussed with patient.  RML pulm nodule - nonsmoker. Recommend PCP f/u to see if any f/u imaging is needed. Discussed with patient. Patient just moved from [place] in June and does not have a PCP. She does have Medicare. I have asked TOC to help arrange PCP follow-up as outpatient.   This  care is post op    LOS: 1 day    Jillyn Ledger , [facility] Surgery 08/10/2021, 9:07 AM Please see Amion for pager number during day hours 7:00am-4:30pm

## 2021-08-11 LAB — BASIC METABOLIC PANEL
Anion gap: 5 (ref 5–15)
BUN: 24 mg/dL — ABNORMAL HIGH (ref 8–23)
CO2: 26 mmol/L (ref 22–32)
Calcium: 9.2 mg/dL (ref 8.9–10.3)
Chloride: 104 mmol/L (ref 98–111)
Creatinine, Ser: 0.95 mg/dL (ref 0.44–1.00)
GFR, Estimated: >60 mL/min (ref >60)
Glucose, Bld: 117 mg/dL — ABNORMAL HIGH (ref 70–99)
Potassium: 3.7 mmol/L (ref 3.5–5.1)
Sodium: 135 mmol/L (ref 135–145)

## 2021-08-11 LAB — CBC
HCT: 33.3 % — ABNORMAL LOW (ref 36.0–46.0)
Hemoglobin: 10.1 g/dL — ABNORMAL LOW (ref 12.0–15.0)
MCH: 24 pg — ABNORMAL LOW (ref 26.0–34.0)
MCHC: 30.3 g/dL (ref 30.0–36.0)
MCV: 79.1 fL — ABNORMAL LOW (ref 80.0–100.0)
Platelets: 370 10*3/uL (ref 150–400)
RBC: 4.21 MIL/uL (ref 3.87–5.11)
RDW: 15.9 % — ABNORMAL HIGH (ref 11.5–15.5)
WBC: 13.2 10*3/uL — ABNORMAL HIGH (ref 4.0–10.5)
nRBC: 0 % (ref 0.0–0.2)

## 2021-08-11 LAB — MAGNESIUM: Magnesium: 2 mg/dL (ref 1.7–2.4)

## 2021-08-11 LAB — SURGICAL PATHOLOGY

## 2021-08-11 MED ORDER — POTASSIUM CHLORIDE 10 MEQ/100ML IV SOLN
10.0000 meq | INTRAVENOUS | Status: AC
  Administered 2021-08-11 (×3): 10 meq via INTRAVENOUS
  Filled 2021-08-11 (×3): qty 100

## 2021-08-11 NOTE — Progress Notes (Signed)
Chaplain provided listening and support to [facility] as she shared that her mom recently passed and that the funeral would be held this weekend.  Hortensia is saddened by the reality that she won't be able to make it.  Tekeisha voiced her questions about why God would allow all of these things to happen at once.  Chaplain and Jacqulin did some talking around faith, control, purpose, and timing.  Chaplain affirmed the human nature of having questions for God even still as a Investment banker, corporate.    Tashia is able to recall points in her life that God has been with her and how things eventually were revealed to be purposeful.  Even in the loss of a child, Tanesha finds tat God still gave and taught her something in that.    For Kaydense, faith is an integral part of her life.  She values the ways that her mom and dad modeled Christianity in front of them and had a marriage built on the foundation of God.  It was important for Jordyan today to participate in Ash Wednesday and communion with there husband.  She desires to hear from God and know that God is still very present with her through all the transitions happening in her life.  Chaplain offered prayer and ritual practices of Taeja's faith tradition with her and her husband.     08/11/21 1400  Clinical Encounter Type  Visited With Patient and family together  Visit Type Initial;Spiritual support  Referral From Patient  Consult/Referral To Chaplain  Spiritual Encounters  Spiritual Needs Prayer;Ritual;Emotional;Grief support

## 2021-08-11 NOTE — Evaluation (Signed)
Physical Therapy Evaluation Patient Details Name: Heidi Peterson MRN: [id number] DOB: 1950-08-02 Today's Date: 08/11/2021  History of Present Illness  71 y.o. female admitted 08/09/21 with abdominal pain.  s/p diagnostic laparoscopy converted to open ileocecectomy with ileocolic anastomosis for Acute perforated appendicitis with feculent peritonitis on 08/09/21. PMH: HTN, kidney stones.  Clinical Impression  Pt ambulated 400' with RW independently, no loss of balance, vital signs stable. No further PT is indicated as she is independent with mobility, will sign off. Encouraged pt to ambulate 3x/day during hospitalization.        Recommendations for follow up therapy are one component of a multi-disciplinary discharge planning process, led by the attending physician.  Recommendations may be updated based on patient status, additional functional criteria and insurance authorization.  Follow Up Recommendations No PT follow up    Assistance Recommended at Discharge Set up Supervision/Assistance  Patient can return home with the following  A little help with bathing/dressing/bathroom;Assist for transportation    Equipment Recommendations None recommended by PT  Recommendations for Other Services       Functional Status Assessment       Precautions / Restrictions Precautions Precautions: Other (comment) Precaution Comments: abdominal incision Restrictions Weight Bearing Restrictions: No      Mobility  Bed Mobility Overal bed mobility: Modified Independent             General bed mobility comments: instructed pt in log roll, pt able to perform roll to R then sidelying to sit without assistance    Transfers Overall transfer level: Modified independent Equipment used: Rolling walker (2 wheels)                    Ambulation/Gait Ambulation/Gait assistance: Modified independent (Device/Increase time) Gait Distance (Feet): 400 Feet Assistive device: Rolling  walker (2 wheels) Gait Pattern/deviations: Decreased stride length, Step-through pattern Gait velocity: decr     General Gait Details: steady, no loss of balance, 2/4 dyspnea, SaO2 95% on room air  Stairs            Wheelchair Mobility    Modified Rankin (Stroke Patients Only)       Balance Overall balance assessment: Modified Independent                                           Pertinent Vitals/Pain Pain Assessment Pain Assessment: 0-10 Pain Score: 6  Pain Location: abdomen with movement Pain Descriptors / Indicators: Sore Pain Intervention(s): Limited activity within patient's tolerance, Monitored during session, Premedicated before session, Repositioned    Home Living Family/patient expects to be discharged to:: Private residence Living Arrangements: Spouse/significant other Available Help at Discharge: Family;Available 24 hours/day   Home Access: Stairs to enter Entrance Stairs-Rails: None Entrance Stairs-Number of Steps: 2   Home Layout: One level Home Equipment: Agricultural consultant (2 wheels)      Prior Function Prior Level of Function : Independent/Modified Independent             Mobility Comments: walked without AD ADLs Comments: independent     Hand Dominance        Extremity/Trunk Assessment   Upper Extremity Assessment Upper Extremity Assessment: Overall WFL for tasks assessed    Lower Extremity Assessment Lower Extremity Assessment: Overall WFL for tasks assessed    Cervical / Trunk Assessment Cervical / Trunk Assessment: Normal  Communication   Communication: No difficulties  Cognition Arousal/Alertness: Awake/alert Behavior During Therapy: WFL for tasks assessed/performed Overall Cognitive Status: Within Functional Limits for tasks assessed                                          General Comments      Exercises     Assessment/Plan    PT Assessment Patient does not need any further  PT services  PT Problem List         PT Treatment Interventions      PT Goals (Current goals can be found in the Care Plan section)  Acute Rehab PT Goals PT Goal Formulation: All assessment and education complete, DC therapy    Frequency       Co-evaluation               AM-PAC PT "6 Clicks" Mobility  Outcome Measure Help needed turning from your back to your side while in a flat bed without using bedrails?: None Help needed moving from lying on your back to sitting on the side of a flat bed without using bedrails?: None Help needed moving to and from a bed to a chair (including a wheelchair)?: None Help needed standing up from a chair using your arms (e.g., wheelchair or bedside chair)?: None Help needed to walk in hospital room?: None Help needed climbing 3-5 steps with a railing? : None 6 Click Score: 24    End of Session   Activity Tolerance: Patient tolerated treatment well Patient left: in bed;with call bell/phone within reach;with family/visitor present Nurse Communication: Mobility status      Time: 8416-6063 PT Time Calculation (min) (ACUTE ONLY): 22 min   Charges:   PT Evaluation $PT Eval Low Complexity: 1 Low        Tamala Ser PT 08/11/2021  Acute Rehabilitation Services Pager [phone] Office [phone]

## 2021-08-11 NOTE — Progress Notes (Signed)
2 Days Post-Op  Subjective: CC Still somewhat sore as expected.  Small amount of flatus but no bowel movement yet.  No nausea but not much appetite.  Walked in the hall 3 times yesterday.  Patient does not have a PCP. She just moved from [place] to [place] in June of last year after getting married.   Objective: Vital signs in last 24 hours: Temp:  [97.7 F (36.5 C)-98.6 F (37 C)] 97.7 F (36.5 C) (02/22 0500) Pulse Rate:  [60-81] 67 (02/22 0500) Resp:  [16-18] 17 (02/22 0500) BP: (112-139)/(47-70) 122/47 (02/22 0500) SpO2:  [92 %-98 %] 98 % (02/22 0500) Last BM Date : 08/08/21  Intake/Output from previous day: 02/21 0701 - 02/22 0700 In: 3457.8 [P.O.:565; I.V.:2792.8; IV Piggyback:100] Out: 883 [Urine:825; Drains:58] Intake/Output this shift: Total I/O In: -  Out: 200 [Urine:200]  PE: Gen:  Alert, NAD, pleasant Card:  RRR Pulm:  CTAB, no W/R/R, effort normal. Now on RA.  Abd: Soft, obese, mildly distended, appropriately tender around incisions and drain - otherwise NT.  Laparoscopic incisions and midline incisions with staples in place and are c/d/I. Midline incision and supraumbilical laparoscopic incision has betadine soaked wicks between staples. JP drain w/ murky SS output -low volume Ext:  No LE edema or calf tenderness Psych: A&Ox3  Skin: no rashes noted, warm and dry  Lab Results:  Recent Labs    08/10/21 0835 08/11/21 0318  WBC 15.5* 13.2*  HGB 11.0* 10.1*  HCT 36.0 33.3*  PLT 375 370    BMET Recent Labs    08/10/21 0835 08/11/21 0318  NA 134* 135  K 4.3 3.7  CL 103 104  CO2 26 26  GLUCOSE 172* 117*  BUN 22 24*  CREATININE 1.14* 0.95  CALCIUM 9.3 9.2    PT/INR No results for input(s): LABPROT, INR in the last 72 hours. CMP     Component Value Date/Time   NA 135 08/11/2021 0318   K 3.7 08/11/2021 0318   CL 104 08/11/2021 0318   CO2 26 08/11/2021 0318   GLUCOSE 117 (H) 08/11/2021 0318   BUN 24 (H) 08/11/2021 0318   CREATININE 0.95  08/11/2021 0318   CALCIUM 9.2 08/11/2021 0318   PROT 6.9 08/09/2021 0820   ALBUMIN 3.6 08/09/2021 0820   AST 16 08/09/2021 0820   ALT 14 08/09/2021 0820   ALKPHOS 87 08/09/2021 0820   BILITOT 0.6 08/09/2021 0820   GFRNONAA >60 08/11/2021 0318   Lipase  No results found for: LIPASE  Studies/Results: CT ABDOMEN PELVIS W CONTRAST  Result Date: 08/09/2021 CLINICAL DATA:  Right lower quadrant pain. EXAM: CT ABDOMEN AND PELVIS WITH CONTRAST TECHNIQUE: Multidetector CT imaging of the abdomen and pelvis was performed using the standard protocol following bolus administration of intravenous contrast. RADIATION DOSE REDUCTION: This exam was performed according to the departmental dose-optimization program which includes automated exposure control, adjustment of the mA and/or kV according to patient size and/or use of iterative reconstruction technique. CONTRAST:  OMNIPAQUE IOHEXOL 300 MG/ML  SOLN COMPARISON:  None. FINDINGS: Lower chest: 3 mm pulmonary nodule in the right middle lobe. Subpleural densities in both lung bases suggesting fibrosis. Asymmetric scarring in the left lower lobe. No pleural effusion. Hepatobiliary: Diffusely decreased attenuation of the liver compatible with steatosis. 1.7 cm hypodensity in the lateral segment of the left hepatic lobe consistent with a cyst. Unremarkable gallbladder. No biliary dilatation. Pancreas: Unremarkable. Spleen: Subcentimeter hypodensity laterally in the spleen, too small to fully characterize. Adrenals/Urinary  Tract: Unremarkable adrenal glands. Several renal calculi bilaterally measuring up to 6 mm. No hydronephrosis. 2.1 cm left renal cyst. Unremarkable bladder. Stomach/Bowel: There is a small sliding hiatal hernia. There is no evidence of bowel obstruction. Left-sided colonic diverticulosis is noted without evidence of acute diverticulitis. The appendix is fluid-filled, thick-walled, and dilated with a diameter of 1.5 cm. There is prominent  surrounding inflammatory stranding and minimal free fluid. Multiple appendicoliths measure up to 7 mm. The wall of the appendix is poorly visualized proximally where there may be an extraluminal appendicolith suggesting possible perforation. Vascular/Lymphatic: Abdominal aortic atherosclerosis without aneurysm. Clustered right lower quadrant mesenteric lymph nodes measuring up to 6 mm in short axis, likely reactive. Reproductive: Thickened appearance of the endometrium for age, estimated at 55 mm. Unremarkable ovaries. Other: Minimal intraperitoneal free fluid in the right lower quadrant and pelvis. No organized fluid collection or pneumoperitoneum. Musculoskeletal: Advanced lower lumbar facet arthrosis with grade 1 anterolisthesis of L4 on L5. IMPRESSION: 1. Acute, likely perforated appendicitis.  No organized abscess. 2. Hepatic steatosis. 3. Nonobstructing bilateral nephrolithiasis. 4. Thickened appearance of the endometrium for age. Recommend nonemergent pelvic ultrasound for further evaluation. 5. 3 mm right middle lobe pulmonary nodule. No follow-up needed if patient is low-risk. Non-contrast chest CT can be considered in 12 months if patient is high-risk. This recommendation follows the consensus statement: Guidelines for Management of Incidental Pulmonary Nodules Detected on CT Images: From the Fleischner Society 2017; Radiology 2017; 284:228-243. 6. Aortic Atherosclerosis (ICD10-I70.0). Electronically Signed   By: Logan Bores M.D.   On: 08/09/2021 10:48    Anti-infectives: Anti-infectives (From admission, onward)    Start     Dose/Rate Route Frequency Ordered Stop   08/10/21 1200  piperacillin-tazobactam (ZOSYN) IVPB 3.375 g        3.375 g 12.5 mL/hr over 240 Minutes Intravenous Every 8 hours 08/10/21 1044     08/10/21 0915  piperacillin-tazobactam (ZOSYN) IVPB 3.375 g  Status:  Discontinued        3.375 g 12.5 mL/hr over 240 Minutes Intravenous Every 8 hours 08/10/21 0815 08/10/21 1044    08/10/21 0900  piperacillin-tazobactam (ZOSYN) IVPB 3.375 g  Status:  Discontinued        3.375 g 100 mL/hr over 30 Minutes Intravenous Every 8 hours 08/10/21 0810 08/10/21 0815   08/09/21 2000  piperacillin-tazobactam (ZOSYN) IVPB 3.375 g  Status:  Discontinued        3.375 g 12.5 mL/hr over 240 Minutes Intravenous Every 8 hours 08/09/21 1146 08/09/21 2210   08/09/21 1115  piperacillin-tazobactam (ZOSYN) IVPB 3.375 g        3.375 g 100 mL/hr over 30 Minutes Intravenous  Once 08/09/21 1105 08/09/21 1149        Assessment/Plan POD 2 s/p diagnostic laparoscopy converted to open ileocecectomy with ileocolic anastomosis for Acute perforated appendicitis with feculent peritonitis by Dr. Dema Severin on 08/09/21 - Cont IV abx 5d post op - Cont JP drain. Currently SS - Okay w/ sips of clears, some flatus. At risk for ileus - Cont wicks.  Will remove tomorrow - AM labs.  - Mobilize. PT consult - Pulm toilet   FEN -try clear liquids today, IVF decreased to 100 cc/hr.  VTE - SCDs, subq heparin ID - Zosyn 2/20 >> Afebrile. WBC 15.5->13.2 Foley - D/c today. TOV  ABL anemia - hgb relatively stable AKI - Cr normalized OSA - CPAP at night. Weaned to RA. Denies CP or SOB HTN - home meds on hold with soft BP  2/20. Monitor.  Thickened endometrium - recommend follow up with GYN outpatient. Discussed with patient.  RML pulm nodule - nonsmoker. Recommend PCP f/u to see if any f/u imaging is needed. Discussed with patient. Patient just moved from [place] in June and does not have a PCP. She does have Medicare. I have asked TOC to help arrange PCP follow-up as outpatient.      LOS: 2 days    Clovis Riley , [facility] 08/11/2021, 8:23 AM Please see Amion for pager number during day hours 7:00am-4:30pm

## 2021-08-12 MED ORDER — METOPROLOL SUCCINATE ER 50 MG PO TB24
50.0000 mg | ORAL_TABLET | Freq: Every morning | ORAL | Status: DC
  Administered 2021-08-12 – 2021-08-19 (×8): 50 mg via ORAL
  Filled 2021-08-12 (×8): qty 1

## 2021-08-12 MED ORDER — LISINOPRIL 20 MG PO TABS
40.0000 mg | ORAL_TABLET | Freq: Every morning | ORAL | Status: DC
  Administered 2021-08-12 – 2021-08-19 (×7): 40 mg via ORAL
  Filled 2021-08-12 (×8): qty 2

## 2021-08-12 NOTE — Progress Notes (Signed)
3 Days Post-Op  Subjective: CC Reports she had a rough night due to return of bowel function.  States she is having flatus and had an episode of incontinence of liquid stool.  Feels the urge to have another bowel movement now.  Denies belching, nausea, or vomiting.  Tolerated small amounts of liquid yesterday  Objective: Vital signs in last 24 hours: Temp:  [98.3 F (36.8 C)-98.7 F (37.1 C)] 98.7 F (37.1 C) (02/23 0554) Pulse Rate:  [81-88] 87 (02/23 0554) Resp:  [18] 18 (02/23 0554) BP: (142-178)/(70-71) 178/71 (02/23 0554) SpO2:  [89 %-94 %] 93 % (02/23 0554) Last BM Date : 08/09/21  Intake/Output from previous day: 02/22 0701 - 02/23 0700 In: 3845.7 [P.O.:885; I.V.:2510.7; IV Piggyback:450] Out: 1090 [Urine:1000; Drains:90] Intake/Output this shift: No intake/output data recorded.  PE: Gen:  Alert, NAD, pleasant Card:  RRR Pulm:  CTAB, no W/R/R, effort normal. Now on RA.  Abd: Soft, obese, mildly distended, appropriately tender around incisions and drain - otherwise NT.  Laparoscopic incisions and midline incisions with staples in place and are c/d/I. Midline incision and supraumbilical laparoscopic incision has betadine soaked wicks between staples. JP drain w/ murky SS output -90cc/24h Ext:  No LE edema or calf tenderness Psych: A&Ox3  Skin: no rashes noted, warm and dry  Lab Results:  Recent Labs    08/10/21 0835 08/11/21 0318  WBC 15.5* 13.2*  HGB 11.0* 10.1*  HCT 36.0 33.3*  PLT 375 370   BMET Recent Labs    08/10/21 0835 08/11/21 0318  NA 134* 135  K 4.3 3.7  CL 103 104  CO2 26 26  GLUCOSE 172* 117*  BUN 22 24*  CREATININE 1.14* 0.95  CALCIUM 9.3 9.2   PT/INR No results for input(s): LABPROT, INR in the last 72 hours. CMP     Component Value Date/Time   NA 135 08/11/2021 0318   K 3.7 08/11/2021 0318   CL 104 08/11/2021 0318   CO2 26 08/11/2021 0318   GLUCOSE 117 (H) 08/11/2021 0318   BUN 24 (H) 08/11/2021 0318   CREATININE 0.95  08/11/2021 0318   CALCIUM 9.2 08/11/2021 0318   PROT 6.9 08/09/2021 0820   ALBUMIN 3.6 08/09/2021 0820   AST 16 08/09/2021 0820   ALT 14 08/09/2021 0820   ALKPHOS 87 08/09/2021 0820   BILITOT 0.6 08/09/2021 0820   GFRNONAA >60 08/11/2021 0318   Lipase  No results found for: LIPASE  Studies/Results: No results found.  Anti-infectives: Anti-infectives (From admission, onward)    Start     Dose/Rate Route Frequency Ordered Stop   08/10/21 1200  piperacillin-tazobactam (ZOSYN) IVPB 3.375 g        3.375 g 12.5 mL/hr over 240 Minutes Intravenous Every 8 hours 08/10/21 1044     08/10/21 0915  piperacillin-tazobactam (ZOSYN) IVPB 3.375 g  Status:  Discontinued        3.375 g 12.5 mL/hr over 240 Minutes Intravenous Every 8 hours 08/10/21 0815 08/10/21 1044   08/10/21 0900  piperacillin-tazobactam (ZOSYN) IVPB 3.375 g  Status:  Discontinued        3.375 g 100 mL/hr over 30 Minutes Intravenous Every 8 hours 08/10/21 0810 08/10/21 0815   08/09/21 2000  piperacillin-tazobactam (ZOSYN) IVPB 3.375 g  Status:  Discontinued        3.375 g 12.5 mL/hr over 240 Minutes Intravenous Every 8 hours 08/09/21 1146 08/09/21 2210   08/09/21 1115  piperacillin-tazobactam (ZOSYN) IVPB 3.375 g  3.375 g 100 mL/hr over 30 Minutes Intravenous  Once 08/09/21 1105 08/09/21 1149        Assessment/Plan POD 3 s/p diagnostic laparoscopy converted to open ileocecectomy with ileocolic anastomosis for Acute perforated appendicitis with feculent peritonitis by Dr. Dema Severin on 08/09/21 - Cont IV abx 5d post op - Cont JP drain. Currently SS -Still has some abdominal distention, also passing gas and having diarrhea.  Advance to full liquid diet then to soft as tolerated. -We will remove wicks from wound today.  Daily dry dressing changes.  And as needed. - AM labs.  - Mobilize. PT consult - Pulm toilet   FEN -try clear liquids today, IVF decreased to 100 cc/hr.  VTE - SCDs, subq heparin ID - Zosyn 2/20 >>  Afebrile. WBC 15.5->13.2 yesterday, afebrile Foley -removed  ABL anemia - hgb relatively stable AKI - Cr normalized OSA - CPAP at night. Weaned to RA. Denies CP or SOB HTN - home meds on hold with soft BP 2/20. Monitor.  Thickened endometrium - recommend follow up with GYN outpatient. Discussed with patient.  RML pulm nodule - nonsmoker. Recommend PCP f/u to see if any f/u imaging is needed. Discussed with patient. Patient just moved from [place] in June and does not have a PCP. She does have Medicare. I have asked TOC to help arrange PCP follow-up as outpatient.      LOS: 3 days    Jill Alexanders , [facility] 08/12/2021, 9:56 AM Please see Amion for pager number during day hours 7:00am-4:30pm

## 2021-08-13 LAB — BASIC METABOLIC PANEL
Anion gap: 7 (ref 5–15)
BUN: 24 mg/dL — ABNORMAL HIGH (ref 8–23)
CO2: 24 mmol/L (ref 22–32)
Calcium: 8.9 mg/dL (ref 8.9–10.3)
Chloride: 106 mmol/L (ref 98–111)
Creatinine, Ser: 0.66 mg/dL (ref 0.44–1.00)
GFR, Estimated: >60 mL/min (ref >60)
Glucose, Bld: 94 mg/dL (ref 70–99)
Potassium: 3.7 mmol/L (ref 3.5–5.1)
Sodium: 137 mmol/L (ref 135–145)

## 2021-08-13 LAB — CBC
HCT: 33.6 % — ABNORMAL LOW (ref 36.0–46.0)
Hemoglobin: 10.3 g/dL — ABNORMAL LOW (ref 12.0–15.0)
MCH: 23.8 pg — ABNORMAL LOW (ref 26.0–34.0)
MCHC: 30.7 g/dL (ref 30.0–36.0)
MCV: 77.6 fL — ABNORMAL LOW (ref 80.0–100.0)
Platelets: 481 10*3/uL — ABNORMAL HIGH (ref 150–400)
RBC: 4.33 MIL/uL (ref 3.87–5.11)
RDW: 15.6 % — ABNORMAL HIGH (ref 11.5–15.5)
WBC: 12.2 10*3/uL — ABNORMAL HIGH (ref 4.0–10.5)
nRBC: 0 % (ref 0.0–0.2)

## 2021-08-13 NOTE — Progress Notes (Signed)
PT Cancellation Note  Patient Details Name: Heidi Peterson MRN: [id number] DOB: [birth year]   Cancelled Treatment:    Reason Eval/Treat Not Completed: Noted new PT order today.  PT screened, no needs identified, will sign off (Pt had a PT evaluation 08/11/21 in which she ambulated 400' with RW independently. RN reports no change in pt's condition since 08/11/21. Pt is mobilizing indendently, no PT indicated. Will sign off.)  Philomena Doheny PT 08/13/2021  Acute Rehabilitation Services Pager [phone] Office [phone]

## 2021-08-13 NOTE — Progress Notes (Signed)
4 Days Post-Op  Subjective: CC No further bowel movements yesterday.  Still with some pain as expected.  Tolerating small amount of soft diet without nausea or increased bloating.  Objective: Vital signs in last 24 hours: Temp:  [98.3 F (36.8 C)-98.5 F (36.9 C)] 98.5 F (36.9 C) (02/23 2016) Pulse Rate:  [66-76] 66 (02/24 0623) Resp:  [18] 18 (02/24 0623) BP: (162-167)/(66-84) 162/66 (02/24 0623) SpO2:  [94 %-95 %] 95 % (02/24 0623) Last BM Date : 08/12/21  Intake/Output from previous day: 02/23 0701 - 02/24 0700 In: 510 [P.O.:360; IV Piggyback:150] Out: 42 [Drains:42] Intake/Output this shift: No intake/output data recorded.  PE: Gen:  Alert, NAD, pleasant Card:  RRR Pulm:  CTAB, no W/R/R, effort normal. Now on RA.  Abd: Soft, obese, mildly distended, appropriately tender around incisions and drain - otherwise NT.  Laparoscopic incisions and midline incisions with staples in place and are c/d/I.  No cellulitis or concerning drainage.  JP drain clearing up, serosanguineous Ext:  No LE edema or calf tenderness Psych: A&Ox3  Skin: no rashes noted, warm and dry  Lab Results:  Recent Labs    08/11/21 0318  WBC 13.2*  HGB 10.1*  HCT 33.3*  PLT 370    BMET Recent Labs    08/11/21 0318 08/13/21 0836  NA 135 137  K 3.7 3.7  CL 104 106  CO2 26 24  GLUCOSE 117* 94  BUN 24* 24*  CREATININE 0.95 0.66  CALCIUM 9.2 8.9    PT/INR No results for input(s): LABPROT, INR in the last 72 hours. CMP     Component Value Date/Time   NA 137 08/13/2021 0836   K 3.7 08/13/2021 0836   CL 106 08/13/2021 0836   CO2 24 08/13/2021 0836   GLUCOSE 94 08/13/2021 0836   BUN 24 (H) 08/13/2021 0836   CREATININE 0.66 08/13/2021 0836   CALCIUM 8.9 08/13/2021 0836   PROT 6.9 08/09/2021 0820   ALBUMIN 3.6 08/09/2021 0820   AST 16 08/09/2021 0820   ALT 14 08/09/2021 0820   ALKPHOS 87 08/09/2021 0820   BILITOT 0.6 08/09/2021 0820   GFRNONAA >60 08/13/2021 0836   Lipase  No  results found for: LIPASE  Studies/Results: No results found.  Anti-infectives: Anti-infectives (From admission, onward)    Start     Dose/Rate Route Frequency Ordered Stop   08/10/21 1200  piperacillin-tazobactam (ZOSYN) IVPB 3.375 g        3.375 g 12.5 mL/hr over 240 Minutes Intravenous Every 8 hours 08/10/21 1044     08/10/21 0915  piperacillin-tazobactam (ZOSYN) IVPB 3.375 g  Status:  Discontinued        3.375 g 12.5 mL/hr over 240 Minutes Intravenous Every 8 hours 08/10/21 0815 08/10/21 1044   08/10/21 0900  piperacillin-tazobactam (ZOSYN) IVPB 3.375 g  Status:  Discontinued        3.375 g 100 mL/hr over 30 Minutes Intravenous Every 8 hours 08/10/21 0810 08/10/21 0815   08/09/21 2000  piperacillin-tazobactam (ZOSYN) IVPB 3.375 g  Status:  Discontinued        3.375 g 12.5 mL/hr over 240 Minutes Intravenous Every 8 hours 08/09/21 1146 08/09/21 2210   08/09/21 1115  piperacillin-tazobactam (ZOSYN) IVPB 3.375 g        3.375 g 100 mL/hr over 30 Minutes Intravenous  Once 08/09/21 1105 08/09/21 1149        Assessment/Plan POD 4 s/p diagnostic laparoscopy converted to open ileocecectomy with ileocolic anastomosis for Acute perforated  appendicitis with feculent peritonitis by Dr. Cliffton Asters on 08/09/21 - Cont IV abx 5d post op - Cont JP drain. Currently SS -Still has some abdominal distention, also passing gas and having diarrhea, tolerating soft diet - Daily dry dressing changes.  And as needed.  Monitor for wound infection - AM labs.  - Mobilize. PT consult - Pulm toilet   FEN -soft diet, monitor for ileus VTE - SCDs, subq heparin ID - Zosyn 2/20 >> Afebrile. WBC 15.5->13.2 2/22, afebrile.  Labs pending for today.  High risk of intra-abdominal abscess Foley -removed  ABL anemia - hgb relatively stable AKI - Cr normalized OSA - CPAP at night. Weaned to RA. Denies CP or SOB HTN - home meds on hold with soft BP 2/20. Monitor.  Thickened endometrium - recommend follow up with GYN  outpatient. Discussed with patient.  RML pulm nodule - nonsmoker. Recommend PCP f/u to see if any f/u imaging is needed. Discussed with patient. Patient just moved from [place] in June and does not have a PCP. She does have Medicare. I have asked TOC to help arrange PCP follow-up as outpatient.      LOS: 4 days    Heidi Peterson , MD [facility] 08/13/2021, 10:06 AM Please see Amion for pager number during day hours 7:00am-4:30pm

## 2021-08-13 NOTE — Plan of Care (Signed)

## 2021-08-13 NOTE — Plan of Care (Signed)
°  Problem: Pain Managment: Goal: General experience of comfort will improve 08/13/2021 2258 by Blase Mess, RN Outcome: Progressing 08/13/2021 2258 by Blase Mess, RN Outcome: Not Progressing   Problem: Elimination: Goal: Will not experience complications related to bowel motility 08/13/2021 2258 by Blase Mess, RN Outcome: Progressing 08/13/2021 2258 by Blase Mess, RN Outcome: Not Progressing Goal: Will not experience complications related to urinary retention 08/13/2021 2258 by Blase Mess, RN Outcome: Progressing 08/13/2021 2258 by Blase Mess, RN Outcome: Not Progressing   Problem: Coping: Goal: Level of anxiety will decrease 08/13/2021 2258 by Blase Mess, RN Outcome: Progressing 08/13/2021 2258 by Blase Mess, RN Outcome: Not Progressing

## 2021-08-13 NOTE — Care Management Important Message (Signed)
Important Message  Patient Details IM Letter given to the Patient. Name: Heidi Peterson MRN: [id number] Date of Birth: 01-Aug-1950   Medicare Important Message Given:  Yes     Caren Macadam 08/13/2021, 1:00 PM

## 2021-08-14 NOTE — Progress Notes (Signed)
5 Days Post-Op   Subjective/Chief Complaint: Complains of soreness. Attending virtual funeral for mom today   Objective: Vital signs in last 24 hours: Temp:  [97.8 F (36.6 C)-98.9 F (37.2 C)] 97.8 F (36.6 C) (02/25 0504) Pulse Rate:  [65-76] 76 (02/25 0504) Resp:  [18] 18 (02/25 0504) BP: (162-169)/(70-78) 169/70 (02/25 0504) SpO2:  [94 %-95 %] 95 % (02/25 0504) Last BM Date : 08/13/21  Intake/Output from previous day: 02/24 0701 - 02/25 0700 In: 1252.8 [P.O.:1092; IV Piggyback:160.8] Out: 30 [Drains:30] Intake/Output this shift: Total I/O In: 39.3 [IV Piggyback:39.3] Out: -   General appearance: alert and cooperative Resp: clear to auscultation bilaterally Cardio: regular rate and rhythm GI: soft, mild tenderness. Dressing clean. Drain output serosanguinous  Lab Results:  Recent Labs    08/13/21 0836  WBC 12.2*  HGB 10.3*  HCT 33.6*  PLT 481*   BMET Recent Labs    08/13/21 0836  NA 137  K 3.7  CL 106  CO2 24  GLUCOSE 94  BUN 24*  CREATININE 0.66  CALCIUM 8.9   PT/INR No results for input(s): LABPROT, INR in the last 72 hours. ABG No results for input(s): PHART, HCO3 in the last 72 hours.  Invalid input(s): PCO2, PO2  Studies/Results: No results found.  Anti-infectives: Anti-infectives (From admission, onward)    Start     Dose/Rate Route Frequency Ordered Stop   08/10/21 1200  piperacillin-tazobactam (ZOSYN) IVPB 3.375 g        3.375 g 12.5 mL/hr over 240 Minutes Intravenous Every 8 hours 08/10/21 1044     08/10/21 0915  piperacillin-tazobactam (ZOSYN) IVPB 3.375 g  Status:  Discontinued        3.375 g 12.5 mL/hr over 240 Minutes Intravenous Every 8 hours 08/10/21 0815 08/10/21 1044   08/10/21 0900  piperacillin-tazobactam (ZOSYN) IVPB 3.375 g  Status:  Discontinued        3.375 g 100 mL/hr over 30 Minutes Intravenous Every 8 hours 08/10/21 0810 08/10/21 0815   08/09/21 2000  piperacillin-tazobactam (ZOSYN) IVPB 3.375 g  Status:   Discontinued        3.375 g 12.5 mL/hr over 240 Minutes Intravenous Every 8 hours 08/09/21 1146 08/09/21 2210   08/09/21 1115  piperacillin-tazobactam (ZOSYN) IVPB 3.375 g        3.375 g 100 mL/hr over 30 Minutes Intravenous  Once 08/09/21 1105 08/09/21 1149       Assessment/Plan: s/p Procedure(s): LAPAROSCOPIC CONVERTED TO OPEN ILEOSEECTOMY (N/A) Advance diet Continue drain Continue abx POD 5 s/p diagnostic laparoscopy converted to open ileocecectomy with ileocolic anastomosis for Acute perforated appendicitis with feculent peritonitis by Dr. Dema Severin on 08/09/21 - Cont IV abx 5d post op - Cont JP drain. Currently SS -Still has some abdominal distention, also passing gas and having diarrhea, tolerating soft diet - Daily dry dressing changes.  And as needed.  Monitor for wound infection - AM labs.  - Mobilize. PT consult - Pulm toilet     FEN -soft diet, monitor for ileus VTE - SCDs, subq heparin ID - Zosyn 2/20 >> Afebrile. WBC 15.5->13.2 2/22, afebrile.  Labs pending for today.  High risk of intra-abdominal abscess Foley -removed   ABL anemia - hgb relatively stable AKI - Cr normalized OSA - CPAP at night. Weaned to RA. Denies CP or SOB HTN - home meds on hold with soft BP 2/20. Monitor.  Thickened endometrium - recommend follow up with GYN outpatient. Discussed with patient.  RML pulm nodule - nonsmoker.  Recommend PCP f/u to see if any f/u imaging is needed. Discussed with patient. Patient just moved from [place] in June and does not have a PCP. She does have Medicare. I have asked TOC to help arrange PCP follow-up as outpatient.   LOS: 5 days    Autumn Messing III 08/14/2021

## 2021-08-14 NOTE — Plan of Care (Signed)
  Problem: Education: Goal: Knowledge of General Education information will improve Description: Including pain rating scale, medication(s)/side effects and non-pharmacologic comfort measures Outcome: Progressing   Problem: Activity: Goal: Risk for activity intolerance will decrease Outcome: Progressing   Problem: Pain Managment: Goal: General experience of comfort will improve Outcome: Progressing   

## 2021-08-15 NOTE — Progress Notes (Signed)
6 Days Post-Op   Subjective/Chief Complaint: Still complains of pain right side of abd. Loose bm's   Objective: Vital signs in last 24 hours: Temp:  [97.9 F (36.6 C)-98.9 F (37.2 C)] 97.9 F (36.6 C) (02/26 0558) Pulse Rate:  [65-86] 65 (02/26 0558) Resp:  [18] 18 (02/26 0558) BP: (157-179)/(65-73) 157/65 (02/26 0558) SpO2:  [95 %] 95 % (02/26 0558) Last BM Date : 08/14/21  Intake/Output from previous day: 02/25 0701 - 02/26 0700 In: 862.8 [P.O.:720; IV Piggyback:142.8] Out: 10 [Drains:10] Intake/Output this shift: No intake/output data recorded.  General appearance: alert and cooperative Resp: clear to auscultation bilaterally Cardio: regular rate and rhythm GI: soft, moderate tenderness on right. Wound clean. Drain output serosanguinous  Lab Results:  Recent Labs    08/13/21 0836  WBC 12.2*  HGB 10.3*  HCT 33.6*  PLT 481*   BMET Recent Labs    08/13/21 0836  NA 137  K 3.7  CL 106  CO2 24  GLUCOSE 94  BUN 24*  CREATININE 0.66  CALCIUM 8.9   PT/INR No results for input(s): LABPROT, INR in the last 72 hours. ABG No results for input(s): PHART, HCO3 in the last 72 hours.  Invalid input(s): PCO2, PO2  Studies/Results: No results found.  Anti-infectives: Anti-infectives (From admission, onward)    Start     Dose/Rate Route Frequency Ordered Stop   08/10/21 1200  piperacillin-tazobactam (ZOSYN) IVPB 3.375 g        3.375 g 12.5 mL/hr over 240 Minutes Intravenous Every 8 hours 08/10/21 1044     08/10/21 0915  piperacillin-tazobactam (ZOSYN) IVPB 3.375 g  Status:  Discontinued        3.375 g 12.5 mL/hr over 240 Minutes Intravenous Every 8 hours 08/10/21 0815 08/10/21 1044   08/10/21 0900  piperacillin-tazobactam (ZOSYN) IVPB 3.375 g  Status:  Discontinued        3.375 g 100 mL/hr over 30 Minutes Intravenous Every 8 hours 08/10/21 0810 08/10/21 0815   08/09/21 2000  piperacillin-tazobactam (ZOSYN) IVPB 3.375 g  Status:  Discontinued        3.375  g 12.5 mL/hr over 240 Minutes Intravenous Every 8 hours 08/09/21 1146 08/09/21 2210   08/09/21 1115  piperacillin-tazobactam (ZOSYN) IVPB 3.375 g        3.375 g 100 mL/hr over 30 Minutes Intravenous  Once 08/09/21 1105 08/09/21 1149       Assessment/Plan: s/p Procedure(s): LAPAROSCOPIC CONVERTED TO OPEN ILEOSEECTOMY (N/A) Advance diet POD 6 s/p diagnostic laparoscopy converted to open ileocecectomy with ileocolic anastomosis for Acute perforated appendicitis with feculent peritonitis by Dr. Cliffton Asters on 08/09/21 - Cont IV abx 5d post op - Cont JP drain. Currently SS -Still has some abdominal distention, also passing gas and having diarrhea, tolerating soft diet - Daily dry dressing changes.  And as needed.  Monitor for wound infection - AM labs.  - Mobilize. PT consult - Pulm toilet     FEN -soft diet, monitor for ileus VTE - SCDs, subq heparin ID - Zosyn 2/20 >> Afebrile. WBC 15.5->13.2 2/22, afebrile.  Labs pending for today.  High risk of intra-abdominal abscess Foley -removed   ABL anemia - hgb relatively stable AKI - Cr normalized OSA - CPAP at night. Weaned to RA. Denies CP or SOB HTN - home meds on hold with soft BP 2/20. Monitor.  Thickened endometrium - recommend follow up with GYN outpatient. Discussed with patient.  RML pulm nodule - nonsmoker. Recommend PCP f/u to see if any  f/u imaging is needed. Discussed with patient. Patient just moved from [place] in June and does not have a PCP. She does have Medicare. I have asked TOC to help arrange PCP follow-up as outpatient.   LOS: 6 days    Heidi Peterson 08/15/2021

## 2021-08-15 NOTE — Plan of Care (Signed)
  Problem: Education: Goal: Knowledge of General Education information will improve Description: Including pain rating scale, medication(s)/side effects and non-pharmacologic comfort measures Outcome: Progressing   Problem: Activity: Goal: Risk for activity intolerance will decrease Outcome: Progressing   Problem: Nutrition: Goal: Adequate nutrition will be maintained Outcome: Progressing   

## 2021-08-15 NOTE — Plan of Care (Signed)
  Problem: Coping: Goal: Level of anxiety will decrease Outcome: Progressing   Problem: Pain Managment: Goal: General experience of comfort will improve Outcome: Progressing   

## 2021-08-16 ENCOUNTER — Inpatient Hospital Stay (HOSPITAL_COMMUNITY): Payer: Medicare Other

## 2021-08-16 LAB — CBC
HCT: 35 % — ABNORMAL LOW (ref 36.0–46.0)
Hemoglobin: 10.6 g/dL — ABNORMAL LOW (ref 12.0–15.0)
MCH: 23.8 pg — ABNORMAL LOW (ref 26.0–34.0)
MCHC: 30.3 g/dL (ref 30.0–36.0)
MCV: 78.5 fL — ABNORMAL LOW (ref 80.0–100.0)
Platelets: 496 K/uL — ABNORMAL HIGH (ref 150–400)
RBC: 4.46 MIL/uL (ref 3.87–5.11)
RDW: 15.9 % — ABNORMAL HIGH (ref 11.5–15.5)
WBC: 18 K/uL — ABNORMAL HIGH (ref 4.0–10.5)
nRBC: 0 % (ref 0.0–0.2)

## 2021-08-16 MED ORDER — IOHEXOL 9 MG/ML PO SOLN
ORAL | Status: AC
  Filled 2021-08-16: qty 1000

## 2021-08-16 MED ORDER — IOHEXOL 9 MG/ML PO SOLN
500.0000 mL | ORAL | Status: AC
  Administered 2021-08-16 (×2): 500 mL via ORAL

## 2021-08-16 MED ORDER — IOHEXOL 300 MG/ML  SOLN
100.0000 mL | Freq: Once | INTRAMUSCULAR | Status: AC | PRN
  Administered 2021-08-16: 100 mL via INTRAVENOUS

## 2021-08-16 NOTE — Care Management Important Message (Signed)
Important Message  Patient Details IM Letter placed in Patients room. Name: [name] MRN: [id number] Date of Birth: 05/02/1951   Medicare Important Message Given:  Yes     Caren Macadam 08/16/2021, 2:32 PM

## 2021-08-16 NOTE — Progress Notes (Signed)
7 Days Post-Op   Subjective/Chief Complaint: BMs are becoming less explosive and are more controllable.  Tolerating her diet.  Pain on the right as expected   Objective: Vital signs in last 24 hours: Temp:  [98 F (36.7 C)-98.7 F (37.1 C)] 98.4 F (36.9 C) (02/27 0601) Pulse Rate:  [65-76] 65 (02/27 0601) Resp:  [18] 18 (02/27 0601) BP: (141-162)/(63-71) 162/71 (02/27 0601) SpO2:  [93 %-94 %] 94 % (02/27 0601) Last BM Date : 08/15/21  Intake/Output from previous day: 02/26 0701 - 02/27 0700 In: 1233.9 [P.O.:1080; IV Piggyback:153.9] Out: 35 [Drains:35] Intake/Output this shift: Total I/O In: 282.8 [P.O.:240; IV Piggyback:42.8] Out: 0   General appearance: alert and cooperative Resp: clear to auscultation bilaterally Cardio: regular rate and rhythm GI: soft, moderate tenderness on right. Wound clean. Drain output serosanguinous  Lab Results:  Recent Labs    08/16/21 1113  WBC 18.0*  HGB 10.6*  HCT 35.0*  PLT 496*   BMET No results for input(s): NA, K, CL, CO2, GLUCOSE, BUN, CREATININE, CALCIUM in the last 72 hours.  PT/INR No results for input(s): LABPROT, INR in the last 72 hours. ABG No results for input(s): PHART, HCO3 in the last 72 hours.  Invalid input(s): PCO2, PO2  Studies/Results: No results found.  Anti-infectives: Anti-infectives (From admission, onward)    Start     Dose/Rate Route Frequency Ordered Stop   08/10/21 1200  piperacillin-tazobactam (ZOSYN) IVPB 3.375 g        3.375 g 12.5 mL/hr over 240 Minutes Intravenous Every 8 hours 08/10/21 1044     08/10/21 0915  piperacillin-tazobactam (ZOSYN) IVPB 3.375 g  Status:  Discontinued        3.375 g 12.5 mL/hr over 240 Minutes Intravenous Every 8 hours 08/10/21 0815 08/10/21 1044   08/10/21 0900  piperacillin-tazobactam (ZOSYN) IVPB 3.375 g  Status:  Discontinued        3.375 g 100 mL/hr over 30 Minutes Intravenous Every 8 hours 08/10/21 0810 08/10/21 0815   08/09/21 2000   piperacillin-tazobactam (ZOSYN) IVPB 3.375 g  Status:  Discontinued        3.375 g 12.5 mL/hr over 240 Minutes Intravenous Every 8 hours 08/09/21 1146 08/09/21 2210   08/09/21 1115  piperacillin-tazobactam (ZOSYN) IVPB 3.375 g        3.375 g 100 mL/hr over 30 Minutes Intravenous  Once 08/09/21 1105 08/09/21 1149       Assessment/Plan: POD 7 s/p diagnostic laparoscopy converted to open ileocecectomy with ileocolic anastomosis for Acute perforated appendicitis with feculent peritonitis by Dr. Cliffton Asters on 08/09/21 - Cont IV abx, WBC up to 18K today despite still being on zosyn - Cont JP drain. Currently SS -tolerating soft diet - Daily dry dressing changes.  And as needed.  Monitor for wound infection - cbc done today with WBC up to 18K despite zosyn.  Given high risk for post op abscess or complication, will repeat a CT scan today to rule out post op collection -check labs in am - Mobilize. PT consult - Pulm toilet     FEN -soft diet VTE - SCDs, subq heparin ID - Zosyn 2/20 >> Foley -removed   ABL anemia - hgb stable AKI - Cr normalized OSA - CPAP at night. Weaned to RA. Denies CP or SOB HTN - resume home meds Thickened endometrium - recommend follow up with GYN outpatient. Discussed with patient.  RML pulm nodule - nonsmoker. Recommend PCP f/u to see if any f/u imaging is needed. Discussed with  patient. Patient just moved from [place] in June and does not have a PCP. She does have Medicare. I have asked TOC to help arrange PCP follow-up as outpatient.   LOS: 7 days    Letha Cape 08/16/2021

## 2021-08-17 ENCOUNTER — Inpatient Hospital Stay (HOSPITAL_COMMUNITY): Payer: Medicare Other

## 2021-08-17 LAB — CBC
HCT: 31.6 % — ABNORMAL LOW (ref 36.0–46.0)
Hemoglobin: 9.5 g/dL — ABNORMAL LOW (ref 12.0–15.0)
MCH: 23.8 pg — ABNORMAL LOW (ref 26.0–34.0)
MCHC: 30.1 g/dL (ref 30.0–36.0)
MCV: 79.2 fL — ABNORMAL LOW (ref 80.0–100.0)
Platelets: 430 10*3/uL — ABNORMAL HIGH (ref 150–400)
RBC: 3.99 MIL/uL (ref 3.87–5.11)
RDW: 15.9 % — ABNORMAL HIGH (ref 11.5–15.5)
WBC: 14.5 10*3/uL — ABNORMAL HIGH (ref 4.0–10.5)
nRBC: 0 % (ref 0.0–0.2)

## 2021-08-17 LAB — BASIC METABOLIC PANEL
Anion gap: 6 (ref 5–15)
BUN: 14 mg/dL (ref 8–23)
CO2: 27 mmol/L (ref 22–32)
Calcium: 9.3 mg/dL (ref 8.9–10.3)
Chloride: 105 mmol/L (ref 98–111)
Creatinine, Ser: 0.69 mg/dL (ref 0.44–1.00)
GFR, Estimated: >60 mL/min (ref >60)
Glucose, Bld: 153 mg/dL — ABNORMAL HIGH (ref 70–99)
Potassium: 3.2 mmol/L — ABNORMAL LOW (ref 3.5–5.1)
Sodium: 138 mmol/L (ref 135–145)

## 2021-08-17 LAB — PROTIME-INR
INR: 1 (ref 0.8–1.2)
Prothrombin Time: 13.6 seconds (ref 11.4–15.2)

## 2021-08-17 MED ORDER — HEPARIN SODIUM (PORCINE) 5000 UNIT/ML IJ SOLN
5000.0000 [IU] | Freq: Three times a day (TID) | INTRAMUSCULAR | Status: DC
  Administered 2021-08-18 – 2021-08-19 (×4): 5000 [IU] via SUBCUTANEOUS
  Filled 2021-08-17 (×5): qty 1

## 2021-08-17 MED ORDER — FENTANYL CITRATE (PF) 100 MCG/2ML IJ SOLN
INTRAMUSCULAR | Status: AC | PRN
  Administered 2021-08-17: 50 ug via INTRAVENOUS

## 2021-08-17 MED ORDER — POTASSIUM CHLORIDE CRYS ER 20 MEQ PO TBCR
40.0000 meq | EXTENDED_RELEASE_TABLET | Freq: Two times a day (BID) | ORAL | Status: AC
  Administered 2021-08-17 (×2): 40 meq via ORAL
  Filled 2021-08-17 (×2): qty 2

## 2021-08-17 MED ORDER — FENTANYL CITRATE (PF) 100 MCG/2ML IJ SOLN
INTRAMUSCULAR | Status: AC
  Filled 2021-08-17: qty 2

## 2021-08-17 MED ORDER — MIDAZOLAM HCL 2 MG/2ML IJ SOLN
INTRAMUSCULAR | Status: AC
  Filled 2021-08-17: qty 4

## 2021-08-17 MED ORDER — MIDAZOLAM HCL 2 MG/2ML IJ SOLN
INTRAMUSCULAR | Status: AC | PRN
  Administered 2021-08-17 (×3): 1 mg via INTRAVENOUS

## 2021-08-17 MED ORDER — SODIUM CHLORIDE 0.9% FLUSH
5.0000 mL | Freq: Three times a day (TID) | INTRAVENOUS | Status: DC
  Administered 2021-08-17 – 2021-08-19 (×7): 5 mL

## 2021-08-17 NOTE — Progress Notes (Signed)
Patient ID: Heidi Peterson, female   DOB: 28-Jul-1950, 71 y.o.   MRN: [id number] [facility] Surgery Progress Note  8 Days Post-Op  Subjective: CC-  Feeling well. Continues to have some right sided abdominal pain. Did well with diet yesterday. Had a couple loose Bms after contrast for CT scan. Denies n/v. CT scan showed bilobed 6.6 x 3.6 x 3.7 cm rectouterine pouch abscess, and a developing 3x 2 x 3 cm region of fluid and gas inferior to the umbilicus.  Objective: Vital signs in last 24 hours: Temp:  [97.5 F (36.4 C)-97.8 F (36.6 C)] 97.8 F (36.6 C) (02/28 0534) Pulse Rate:  [55-73] 55 (02/28 0534) Resp:  [17] 17 (02/28 0534) BP: (136-183)/(55-80) 151/60 (02/28 0534) SpO2:  [95 %-96 %] 96 % (02/28 0534) Last BM Date : 08/16/21  Intake/Output from previous day: 02/27 0701 - 02/28 0700 In: 1441.8 [P.O.:1300; IV Piggyback:141.8] Out: 5 [Drains:5] Intake/Output this shift: No intake/output data recorded.  PE: General appearance: alert and cooperative GI: soft, mild tenderness on right. Wound clean without cellulitis or active drainage. Drain output serosanguinous  Lab Results:  Recent Labs    08/16/21 1113 08/17/21 0309  WBC 18.0* 14.5*  HGB 10.6* 9.5*  HCT 35.0* 31.6*  PLT 496* 430*   BMET Recent Labs    08/17/21 0309  NA 138  K 3.2*  CL 105  CO2 27  GLUCOSE 153*  BUN 14  CREATININE 0.69  CALCIUM 9.3   PT/INR No results for input(s): LABPROT, INR in the last 72 hours. CMP     Component Value Date/Time   NA 138 08/17/2021 0309   K 3.2 (L) 08/17/2021 0309   CL 105 08/17/2021 0309   CO2 27 08/17/2021 0309   GLUCOSE 153 (H) 08/17/2021 0309   BUN 14 08/17/2021 0309   CREATININE 0.69 08/17/2021 0309   CALCIUM 9.3 08/17/2021 0309   PROT 6.9 08/09/2021 0820   ALBUMIN 3.6 08/09/2021 0820   AST 16 08/09/2021 0820   ALT 14 08/09/2021 0820   ALKPHOS 87 08/09/2021 0820   BILITOT 0.6 08/09/2021 0820   GFRNONAA >60 08/17/2021 0309   Lipase  No  results found for: LIPASE     Studies/Results: CT ABDOMEN PELVIS W CONTRAST  Result Date: 08/16/2021 CLINICAL DATA:  Abdominal pain, post-op s/p ileocecectomy for perforated appendicitis, rule out abscess, WBC 18K EXAM: CT ABDOMEN AND PELVIS WITH CONTRAST TECHNIQUE: Multidetector CT imaging of the abdomen and pelvis was performed using the standard protocol following bolus administration of intravenous contrast. RADIATION DOSE REDUCTION: This exam was performed according to the departmental dose-optimization program which includes automated exposure control, adjustment of the mA and/or kV according to patient size and/or use of iterative reconstruction technique. CONTRAST:  OMNIPAQUE IOHEXOL 300 MG/ML  SOLN COMPARISON:  CT abdomen pelvis 08/09/2021 FINDINGS: Lower chest: Bilateral trace pleural effusions. Associated bilateral lobe passive atelectasis. Hepatobiliary: A stable 1.8 cm fluid dense lesion within the left hepatic lobe likely represents a simple hepatic cyst. Otherwise no focal liver abnormality. No gallstones, gallbladder wall thickening, or pericholecystic fluid. No biliary dilatation. Pancreas: No focal lesion. Normal pancreatic contour. No surrounding inflammatory changes. No main pancreatic ductal dilatation. Spleen: Similar-appearing subcentimeter splenic hypodensity. Otherwise normal in size without focal abnormality. A splenule is noted. Adrenals/Urinary Tract: No adrenal nodule bilaterally. Bilateral kidneys enhance symmetrically. There is a stable 6 mm calcified stone within the left kidney. Couple other punctate calcified stones within the left kidney. Right nephrolithiasis measuring up to 4 mm.  A 2.3 cm fluid density lesion within left kidney likely represents a simple renal cyst. No hydronephrosis. No hydroureter. The urinary bladder is unremarkable. Stomach/Bowel: PO contrast reaches the ileum. No finding of PO contrast extravasation. Surgical changes related to an  ileocecectomy. Stomach is within normal limits. No evidence of bowel wall thickening or dilatation. Scattered colonic diverticulosis. Appendix appears normal. Vascular/Lymphatic: No abdominal aorta or iliac aneurysm. Mild atherosclerotic plaque of the aorta and its branches. No abdominal, pelvic, or inguinal lymphadenopathy. Reproductive: Persistent thickened endometrium. Otherwise uterus and bilateral adnexa are unremarkable. Other: Surgical drain noted with tip terminating in the anterior left pelvis. Trace free fluid. No intraperitoneal free gas. Bilobed rectouterine pouch fluid collection with minimally enhancing peripheral walls that measures 6.6 x 3.6 x 3.7cm. The collection is noted to inseparable from the rectosigmoid colon. Musculoskeletal: Mild subcutaneus soft tissue edema. Healing anterior abdominal incision with associated region of fluid and gas inferior to the umbilicus measuring 3.3 x 2.4 x 3.2 cm. No definite surrounding peripheral enhancement. No suspicious lytic or blastic osseous lesions. No acute displaced fracture. IMPRESSION: 1. Developing bilobed 6.6 x 3.6 x 3.7 cm rectouterine pouch abscess. The collection is noted to be inseparable of the rectosigmoid colon. 2. Healing anterior abdominal incision with associated developing 3 x 2 x 3 cm region of fluid and gas inferior to the umbilicus. No definite peripheral enhancement. Finding may represent a developing subcutaneus soft tissue abscess versus surgical changes. 3. Bilateral trace pleural effusions. 4. Colonic diverticulosis with no acute diverticulitis. 5. Bilateral nonobstructive nephrolithiasis measuring up to 6 mm on the left and 4 mm on the right. 6. Thickened endometrium. Recommend nonemergent pelvic ultrasound as anunderlying malignancy is not excluded. 7.  Aortic Atherosclerosis (ICD10-I70.0). Electronically Signed   By: Tish Frederickson M.D.   On: 08/16/2021 19:45    Anti-infectives: Anti-infectives (From admission, onward)     Start     Dose/Rate Route Frequency Ordered Stop   08/10/21 1200  piperacillin-tazobactam (ZOSYN) IVPB 3.375 g        3.375 g 12.5 mL/hr over 240 Minutes Intravenous Every 8 hours 08/10/21 1044     08/10/21 0915  piperacillin-tazobactam (ZOSYN) IVPB 3.375 g  Status:  Discontinued        3.375 g 12.5 mL/hr over 240 Minutes Intravenous Every 8 hours 08/10/21 0815 08/10/21 1044   08/10/21 0900  piperacillin-tazobactam (ZOSYN) IVPB 3.375 g  Status:  Discontinued        3.375 g 100 mL/hr over 30 Minutes Intravenous Every 8 hours 08/10/21 0810 08/10/21 0815   08/09/21 2000  piperacillin-tazobactam (ZOSYN) IVPB 3.375 g  Status:  Discontinued        3.375 g 12.5 mL/hr over 240 Minutes Intravenous Every 8 hours 08/09/21 1146 08/09/21 2210   08/09/21 1115  piperacillin-tazobactam (ZOSYN) IVPB 3.375 g        3.375 g 100 mL/hr over 30 Minutes Intravenous  Once 08/09/21 1105 08/09/21 1149        Assessment/Plan POD 8 s/p diagnostic laparoscopy converted to open ileocecectomy with ileocolic anastomosis for Acute perforated appendicitis with feculent peritonitis by Dr. Cliffton Asters on 08/09/21 - CT 2/27 CT scan showed bilobed 6.6 x 3.6 x 3.7 cm rectouterine pouch abscess, and a developing 3x 2 x 3 cm region of fluid and gas inferior to the umbilicus. - Continue IV abx. Will ask IR to evaluate for possible drain placement vs aspiration.  - Cont JP drain. Currently SS - Daily dry dressing changes.  And as needed.  Monitor for wound infection   FEN - NPO for possible procedure VTE - SCDs, subq heparin ID - Zosyn 2/20 >> Foley -removed   ABL anemia - hgb stable AKI - Cr normalized OSA - CPAP at night. Weaned to RA. Denies CP or SOB HTN - resume home meds Thickened endometrium - recommend follow up with GYN outpatient. Discussed with patient.  RML pulm nodule - nonsmoker. Recommend PCP f/u to see if any f/u imaging is needed. Discussed with patient. Patient just moved from [place] in June and does not have a  PCP. She does have Medicare. I have asked TOC to help arrange PCP follow-up as outpatient.    LOS: 8 days    Franne Forts, [facility] Surgery 08/17/2021, 9:31 AM Please see Amion for pager number during day hours 7:00am-4:30pm

## 2021-08-17 NOTE — Consult Note (Signed)
Chief Complaint: Patient was seen in consultation today for CT-guided aspiration/drainage of pelvic abscess Chief Complaint  Patient presents with   Abdominal Pain    Referring Physician(s): Toth,P  Supervising Physician: Michaelle Birks  Patient Status: [facility] - In-pt  History of Present Illness: Heidi Peterson is a 71 y.o. female with past medical history of nephrolithiasis who is status post diagnostic laparoscopy converted to open ileocecectomy with ileocolic anastomosis for acute perforated appendicitis with feculent peritonitis on 08/09/2021.  Due to persistent leukocytosis and abdominal pain follow-up CT was performed yesterday which revealed:  1. Developing bilobed 6.6 x 3.6 x 3.7 cm rectouterine pouch abscess. The collection is noted to be inseparable of the rectosigmoid colon. 2. Healing anterior abdominal incision with associated developing 3 x 2 x 3 cm region of fluid and gas inferior to the umbilicus. No definite peripheral enhancement. Finding may represent a developing subcutaneus soft tissue abscess versus surgical changes. 3. Bilateral trace pleural effusions. 4. Colonic diverticulosis with no acute diverticulitis. 5. Bilateral nonobstructive nephrolithiasis measuring up to 6 mm on the left and 4 mm on the right. 6. Thickened endometrium. Recommend nonemergent pelvic ultrasound as anunderlying malignancy is not excluded. 7.  Aortic Atherosclerosis  She is currently afebrile, WBC 14.5, hemoglobin 9.5, platelets 430K, potassium 3.2, creatinine normal, PT/INR pending.  She has a right lower quadrant surgical drain in place.  Request now received from surgery for image guided pelvic abscess drainage.  Past Medical History:  Diagnosis Date   Kidney stones     Past Surgical History:  Procedure Laterality Date   LAPAROSCOPIC APPENDECTOMY N/A 08/09/2021   Procedure: LAPAROSCOPIC CONVERTED TO OPEN ILEOSEECTOMY;  Surgeon: Ileana Roup, MD;  Location: [facility]  [facility];  Service: General;  Laterality: N/A;    Allergies: Patient has no known allergies.  Medications: Prior to Admission medications   Medication Sig Start Date End Date Taking? Authorizing Provider  acetaminophen (TYLENOL) 500 MG tablet Take 500 mg by mouth every 8 (eight) hours as needed (pain).   Yes [provider]  ASPERCREME LIDOCAINE EX Apply 1 application topically at bedtime as needed (knee pain/arthritis).   Yes [provider]  BIOTIN PO Take 1 tablet by mouth every morning.   Yes [provider]  Glucosamine-Chondroitin (OSTEO BI-FLEX REGULAR STRENGTH PO) Take 1 tablet by mouth at bedtime.   Yes [provider]  lisinopril (ZESTRIL) 40 MG tablet Take 40 mg by mouth every morning.   Yes [provider]  metoprolol succinate (TOPROL-XL) 50 MG 24 hr tablet Take 50 mg by mouth every morning. Take with or immediately following a meal.   Yes [provider]  Multiple Vitamin (MULTIVITAMIN WITH MINERALS) TABS tablet Take 1 tablet by mouth every morning.   Yes [provider]  naproxen sodium (ALEVE) 220 MG tablet Take 220 mg by mouth every 8 (eight) hours as needed (pain).   Yes [provider]  Omega-3 Fatty Acids (OMEGA-3 PO) Take 1 capsule by mouth every morning.   Yes [provider]  polyvinyl alcohol (ARTIFICIAL TEARS) 1.4 % ophthalmic solution Place 1 drop into both eyes at bedtime as needed for dry eyes.   Yes [provider]  PRESCRIPTION MEDICATION Inhale into the lungs See admin instructions. CPAP - use at night and whenever resting   Yes [provider]     History reviewed. No pertinent family history.  Social History   Socioeconomic History   Marital status: Married    Spouse name: Not  on file   Number of children: Not on file   Years of education: Not on file   Highest education level: Not on file  Occupational History   Not on file  Tobacco Use   Smoking status:  Never   Smokeless tobacco: Never  Substance and Sexual Activity   Alcohol use: Not on file   Drug use: Never   Sexual activity: Not on file  Other Topics Concern   Not on file  Social History Narrative   Not on file   Social Determinants of Health   Financial Resource Strain: Not on file  Food Insecurity: Not on file  Transportation Needs: Not on file  Physical Activity: Not on file  Stress: Not on file  Social Connections: Not on file      Review of Systems denies fever, headache, chest pain, dyspnea, cough, back pain, nausea, vomiting or bleeding.  Does have right-sided abdominal discomfort  Vital Signs: BP (!) 151/60 (BP Location: Right Arm)    Pulse (!) 55    Temp 97.8 F (36.6 C) (Oral)    Resp 17    Ht 5\' 2"  (1.575 m)    Wt 212 lb (96.2 kg)    SpO2 96%    BMI 38.78 kg/m   Physical Exam awake, alert.  Chest with slightly diminished breath sounds bases.  Heart with slightly bradycardic but regular rhythm.  Abdomen soft, positive bowel sounds, some tenderness noted on right side; right lower quadrant surgical drain in place, no significant lower extremity edema.  Imaging: CT ABDOMEN PELVIS W CONTRAST  Result Date: 08/16/2021 CLINICAL DATA:  Abdominal pain, post-op s/p ileocecectomy for perforated appendicitis, rule out abscess, WBC 18K EXAM: CT ABDOMEN AND PELVIS WITH CONTRAST TECHNIQUE: Multidetector CT imaging of the abdomen and pelvis was performed using the standard protocol following bolus administration of intravenous contrast. RADIATION DOSE REDUCTION: This exam was performed according to the departmental dose-optimization program which includes automated exposure control, adjustment of the mA and/or kV according to patient size and/or use of iterative reconstruction technique. CONTRAST:  189mL OMNIPAQUE IOHEXOL 300 MG/ML  SOLN COMPARISON:  CT abdomen pelvis 08/09/2021 FINDINGS: Lower chest: Bilateral trace pleural effusions. Associated bilateral lobe passive atelectasis.  Hepatobiliary: A stable 1.8 cm fluid dense lesion within the left hepatic lobe likely represents a simple hepatic cyst. Otherwise no focal liver abnormality. No gallstones, gallbladder wall thickening, or pericholecystic fluid. No biliary dilatation. Pancreas: No focal lesion. Normal pancreatic contour. No surrounding inflammatory changes. No main pancreatic ductal dilatation. Spleen: Similar-appearing subcentimeter splenic hypodensity. Otherwise normal in size without focal abnormality. A splenule is noted. Adrenals/Urinary Tract: No adrenal nodule bilaterally. Bilateral kidneys enhance symmetrically. There is a stable 6 mm calcified stone within the left kidney. Couple other punctate calcified stones within the left kidney. Right nephrolithiasis measuring up to 4 mm. A 2.3 cm fluid density lesion within left kidney likely represents a simple renal cyst. No hydronephrosis. No hydroureter. The urinary bladder is unremarkable. Stomach/Bowel: PO contrast reaches the ileum. No finding of PO contrast extravasation. Surgical changes related to an ileocecectomy. Stomach is within normal limits. No evidence of bowel wall thickening or dilatation. Scattered colonic diverticulosis. Appendix appears normal. Vascular/Lymphatic: No abdominal aorta or iliac aneurysm. Mild atherosclerotic plaque of the aorta and its branches. No abdominal, pelvic, or inguinal lymphadenopathy. Reproductive: Persistent thickened endometrium. Otherwise uterus and bilateral adnexa are unremarkable. Other: Surgical drain noted with tip terminating in the anterior left pelvis. Trace free fluid. No intraperitoneal free gas. Bilobed rectouterine  pouch fluid collection with minimally enhancing peripheral walls that measures 6.6 x 3.6 x 3.7cm. The collection is noted to inseparable from the rectosigmoid colon. Musculoskeletal: Mild subcutaneus soft tissue edema. Healing anterior abdominal incision with associated region of fluid and gas inferior to the  umbilicus measuring 3.3 x 2.4 x 3.2 cm. No definite surrounding peripheral enhancement. No suspicious lytic or blastic osseous lesions. No acute displaced fracture. IMPRESSION: 1. Developing bilobed 6.6 x 3.6 x 3.7 cm rectouterine pouch abscess. The collection is noted to be inseparable of the rectosigmoid colon. 2. Healing anterior abdominal incision with associated developing 3 x 2 x 3 cm region of fluid and gas inferior to the umbilicus. No definite peripheral enhancement. Finding may represent a developing subcutaneus soft tissue abscess versus surgical changes. 3. Bilateral trace pleural effusions. 4. Colonic diverticulosis with no acute diverticulitis. 5. Bilateral nonobstructive nephrolithiasis measuring up to 6 mm on the left and 4 mm on the right. 6. Thickened endometrium. Recommend nonemergent pelvic ultrasound as anunderlying malignancy is not excluded. 7.  Aortic Atherosclerosis (ICD10-I70.0). Electronically Signed   By: Iven Finn M.D.   On: 08/16/2021 19:45   CT ABDOMEN PELVIS W CONTRAST  Result Date: 08/09/2021 CLINICAL DATA:  Right lower quadrant pain. EXAM: CT ABDOMEN AND PELVIS WITH CONTRAST TECHNIQUE: Multidetector CT imaging of the abdomen and pelvis was performed using the standard protocol following bolus administration of intravenous contrast. RADIATION DOSE REDUCTION: This exam was performed according to the departmental dose-optimization program which includes automated exposure control, adjustment of the mA and/or kV according to patient size and/or use of iterative reconstruction technique. CONTRAST:  123mL OMNIPAQUE IOHEXOL 300 MG/ML  SOLN COMPARISON:  None. FINDINGS: Lower chest: 3 mm pulmonary nodule in the right middle lobe. Subpleural densities in both lung bases suggesting fibrosis. Asymmetric scarring in the left lower lobe. No pleural effusion. Hepatobiliary: Diffusely decreased attenuation of the liver compatible with steatosis. 1.7 cm hypodensity in the lateral segment  of the left hepatic lobe consistent with a cyst. Unremarkable gallbladder. No biliary dilatation. Pancreas: Unremarkable. Spleen: Subcentimeter hypodensity laterally in the spleen, too small to fully characterize. Adrenals/Urinary Tract: Unremarkable adrenal glands. Several renal calculi bilaterally measuring up to 6 mm. No hydronephrosis. 2.1 cm left renal cyst. Unremarkable bladder. Stomach/Bowel: There is a small sliding hiatal hernia. There is no evidence of bowel obstruction. Left-sided colonic diverticulosis is noted without evidence of acute diverticulitis. The appendix is fluid-filled, thick-walled, and dilated with a diameter of 1.5 cm. There is prominent surrounding inflammatory stranding and minimal free fluid. Multiple appendicoliths measure up to 7 mm. The wall of the appendix is poorly visualized proximally where there may be an extraluminal appendicolith suggesting possible perforation. Vascular/Lymphatic: Abdominal aortic atherosclerosis without aneurysm. Clustered right lower quadrant mesenteric lymph nodes measuring up to 6 mm in short axis, likely reactive. Reproductive: Thickened appearance of the endometrium for age, estimated at 64 mm. Unremarkable ovaries. Other: Minimal intraperitoneal free fluid in the right lower quadrant and pelvis. No organized fluid collection or pneumoperitoneum. Musculoskeletal: Advanced lower lumbar facet arthrosis with grade 1 anterolisthesis of L4 on L5. IMPRESSION: 1. Acute, likely perforated appendicitis.  No organized abscess. 2. Hepatic steatosis. 3. Nonobstructing bilateral nephrolithiasis. 4. Thickened appearance of the endometrium for age. Recommend nonemergent pelvic ultrasound for further evaluation. 5. 3 mm right middle lobe pulmonary nodule. No follow-up needed if patient is low-risk. Non-contrast chest CT can be considered in 12 months if patient is high-risk. This recommendation follows the consensus statement: Guidelines for Management of Incidental  Pulmonary Nodules Detected on CT Images: From the Fleischner Society 2017; Radiology 2017; 284:228-243. 6. Aortic Atherosclerosis (ICD10-I70.0). Electronically Signed   By: Logan Bores M.D.   On: 08/09/2021 10:48    Labs:  CBC: Recent Labs    08/11/21 0318 08/13/21 0836 08/16/21 1113 08/17/21 0309  WBC 13.2* 12.2* 18.0* 14.5*  HGB 10.1* 10.3* 10.6* 9.5*  HCT 33.3* 33.6* 35.0* 31.6*  PLT 370 481* 496* 430*    COAGS: No results for input(s): INR, APTT in the last 8760 hours.  BMP: Recent Labs    08/10/21 0835 08/11/21 0318 08/13/21 0836 08/17/21 0309  NA 134* 135 137 138  K 4.3 3.7 3.7 3.2*  CL 103 104 106 105  CO2 26 26 24 27   GLUCOSE 172* 117* 94 153*  BUN 22 24* 24* 14  CALCIUM 9.3 9.2 8.9 9.3  CREATININE 1.14* 0.95 0.66 0.69  GFRNONAA 52* >60 >60 >60    LIVER FUNCTION TESTS: Recent Labs    08/09/21 0820  BILITOT 0.6  AST 16  ALT 14  ALKPHOS 87  PROT 6.9  ALBUMIN 3.6    TUMOR MARKERS: No results for input(s): AFPTM, CEA, CA199, CHROMGRNA in the last 8760 hours.  Assessment and Plan: 71 y.o. female with past medical history of nephrolithiasis who is status post diagnostic laparoscopy converted to open ileocecectomy with ileocolic anastomosis for acute perforated appendicitis with feculent peritonitis on 08/09/2021.  Due to persistent leukocytosis and abdominal pain follow-up CT was performed yesterday which revealed:  1. Developing bilobed 6.6 x 3.6 x 3.7 cm rectouterine pouch abscess. The collection is noted to be inseparable of the rectosigmoid colon. 2. Healing anterior abdominal incision with associated developing 3 x 2 x 3 cm region of fluid and gas inferior to the umbilicus. No definite peripheral enhancement. Finding may represent a developing subcutaneus soft tissue abscess versus surgical changes. 3. Bilateral trace pleural effusions. 4. Colonic diverticulosis with no acute diverticulitis. 5. Bilateral nonobstructive nephrolithiasis measuring  up to 6 mm on the left and 4 mm on the right. 6. Thickened endometrium. Recommend nonemergent pelvic ultrasound as anunderlying malignancy is not excluded. 7.  Aortic Atherosclerosis  She is currently afebrile, WBC 14.5, hemoglobin 9.5, platelets 430K, potassium 3.2, creatinine normal, PT/INR pending.  She has a right lower quadrant surgical drain in place.  Request now received from surgery for image guided pelvic abscess drainage.  Imaging studies have been reviewed by Dr.Mugweru. Risks and benefits discussed with the patient /spouse including bleeding, infection, damage to adjacent structures, bowel perforation/fistula connection, and sepsis.  All of the patient's questions were answered, patient is agreeable to proceed. Consent signed and in chart.  Procedure scheduled for today.  Thank you for this interesting consult.  I greatly enjoyed meeting Heidi Peterson and look forward to participating in their care.  A copy of this report was sent to the requesting provider on this date.  Electronically Signed: D. Rowe Robert, PA-C 08/17/2021, 10:11 AM   I spent a total of  25 minutes in face to face in clinical consultation, greater than 50% of which was counseling/coordinating care for CT-guided drainage of pelvic abscess

## 2021-08-17 NOTE — Procedures (Signed)
Vascular and Interventional Radiology Procedure Note  Patient: Heidi Peterson DOB: 1950/11/06 Medical Record Number: [id number] Note Date/Time: 08/17/21 12:15 PM   Performing Physician: Michaelle Birks, MD Assistant(s): None  Diagnosis: Pelvic abscess  Procedure: DRAINAGE CATHETER PLACEMENT into PELVIC ABSCESS  Anesthesia: Conscious Sedation Complications: None Estimated Blood Loss:  0 mL Specimens: Sent for Gram Stain, Aerobe Culture, and Anerobe Culture  Findings:  Successful CT-guided L transgluteal approach placement of 12 F catheter into pelvic abscess.  Plan:  - Flush drain with 5 mL Normal Saline every 8 hours. - Follow up drain evaluation / sinogram in 2 week(s).  See detailed procedure note with images in PACS. The patient tolerated the procedure well without incident or complication and was returned to Floor Bed in stable condition.    Michaelle Birks, MD [facility]   Pager. [place]

## 2021-08-18 NOTE — Progress Notes (Signed)
9 Days Post-Op   Subjective/Chief Complaint: Feels better. Doesn't quite feel ready for home   Objective: Vital signs in last 24 hours: Temp:  [98.2 F (36.8 C)-99 F (37.2 C)] 98.5 F (36.9 C) (03/01 0444) Pulse Rate:  [58-77] 58 (03/01 0444) Resp:  [16-22] 17 (03/01 0444) BP: (122-185)/(41-72) 151/60 (03/01 0444) SpO2:  [94 %-100 %] 96 % (03/01 0444) Last BM Date : 08/17/21  Intake/Output from previous day: 02/28 0701 - 03/01 0700 In: 853.8 [P.O.:600; IV Piggyback:243.8] Out: 26 [Drains:26] Intake/Output this shift: Total I/O In: 300 [P.O.:300] Out: 0   General appearance: alert and cooperative Resp: clear to auscultation bilaterally Cardio: regular rate and rhythm GI: soft, mild tenderness. Jp with minimal output. New drain placed yesterday  Lab Results:  Recent Labs    08/16/21 1113 08/17/21 0309  WBC 18.0* 14.5*  HGB 10.6* 9.5*  HCT 35.0* 31.6*  PLT 496* 430*   BMET Recent Labs    08/17/21 0309  NA 138  K 3.2*  CL 105  CO2 27  GLUCOSE 153*  BUN 14  CREATININE 0.69  CALCIUM 9.3   PT/INR Recent Labs    08/17/21 1010  LABPROT 13.6  INR 1.0   ABG No results for input(s): PHART, HCO3 in the last 72 hours.  Invalid input(s): PCO2, PO2  Studies/Results: CT ABDOMEN PELVIS W CONTRAST  Result Date: 08/16/2021 CLINICAL DATA:  Abdominal pain, post-op s/p ileocecectomy for perforated appendicitis, rule out abscess, WBC 18K EXAM: CT ABDOMEN AND PELVIS WITH CONTRAST TECHNIQUE: Multidetector CT imaging of the abdomen and pelvis was performed using the standard protocol following bolus administration of intravenous contrast. RADIATION DOSE REDUCTION: This exam was performed according to the departmental dose-optimization program which includes automated exposure control, adjustment of the mA and/or kV according to patient size and/or use of iterative reconstruction technique. CONTRAST:  OMNIPAQUE IOHEXOL 300 MG/ML  SOLN COMPARISON:  CT abdomen pelvis  08/09/2021 FINDINGS: Lower chest: Bilateral trace pleural effusions. Associated bilateral lobe passive atelectasis. Hepatobiliary: A stable 1.8 cm fluid dense lesion within the left hepatic lobe likely represents a simple hepatic cyst. Otherwise no focal liver abnormality. No gallstones, gallbladder wall thickening, or pericholecystic fluid. No biliary dilatation. Pancreas: No focal lesion. Normal pancreatic contour. No surrounding inflammatory changes. No main pancreatic ductal dilatation. Spleen: Similar-appearing subcentimeter splenic hypodensity. Otherwise normal in size without focal abnormality. A splenule is noted. Adrenals/Urinary Tract: No adrenal nodule bilaterally. Bilateral kidneys enhance symmetrically. There is a stable 6 mm calcified stone within the left kidney. Couple other punctate calcified stones within the left kidney. Right nephrolithiasis measuring up to 4 mm. A 2.3 cm fluid density lesion within left kidney likely represents a simple renal cyst. No hydronephrosis. No hydroureter. The urinary bladder is unremarkable. Stomach/Bowel: PO contrast reaches the ileum. No finding of PO contrast extravasation. Surgical changes related to an ileocecectomy. Stomach is within normal limits. No evidence of bowel wall thickening or dilatation. Scattered colonic diverticulosis. Appendix appears normal. Vascular/Lymphatic: No abdominal aorta or iliac aneurysm. Mild atherosclerotic plaque of the aorta and its branches. No abdominal, pelvic, or inguinal lymphadenopathy. Reproductive: Persistent thickened endometrium. Otherwise uterus and bilateral adnexa are unremarkable. Other: Surgical drain noted with tip terminating in the anterior left pelvis. Trace free fluid. No intraperitoneal free gas. Bilobed rectouterine pouch fluid collection with minimally enhancing peripheral walls that measures 6.6 x 3.6 x 3.7cm. The collection is noted to inseparable from the rectosigmoid colon. Musculoskeletal: Mild  subcutaneus soft tissue edema. Healing anterior abdominal incision with  associated region of fluid and gas inferior to the umbilicus measuring 3.3 x 2.4 x 3.2 cm. No definite surrounding peripheral enhancement. No suspicious lytic or blastic osseous lesions. No acute displaced fracture. IMPRESSION: 1. Developing bilobed 6.6 x 3.6 x 3.7 cm rectouterine pouch abscess. The collection is noted to be inseparable of the rectosigmoid colon. 2. Healing anterior abdominal incision with associated developing 3 x 2 x 3 cm region of fluid and gas inferior to the umbilicus. No definite peripheral enhancement. Finding may represent a developing subcutaneus soft tissue abscess versus surgical changes. 3. Bilateral trace pleural effusions. 4. Colonic diverticulosis with no acute diverticulitis. 5. Bilateral nonobstructive nephrolithiasis measuring up to 6 mm on the left and 4 mm on the right. 6. Thickened endometrium. Recommend nonemergent pelvic ultrasound as anunderlying malignancy is not excluded. 7.  Aortic Atherosclerosis (ICD10-I70.0). Electronically Signed   By: Tish Frederickson M.D.   On: 08/16/2021 19:45   CT IMAGE GUIDED DRAINAGE BY PERCUTANEOUS CATHETER  Result Date: 08/17/2021 INDICATION: History of recent acute perforated appendicitis s/p lap converted open ileocecectomy, with new pelvic abscess EXAM: CT GUIDED LEFT TRANSGLUTEAL APPROACH PELVIC DRAINAGE CATHETER PLACEMENT RADIATION DOSE REDUCTION: This exam was performed according to the departmental dose-optimization program which includes automated exposure control, adjustment of the mA and/or kV according to patient size and/or use of iterative reconstruction technique. COMPARISON:  CT AP, 08/09/2021 and 08/16/2021. MEDICATIONS: The patient is currently admitted to the hospital and receiving intravenous antibiotics. The antibiotics were administered within an appropriate time frame prior to the initiation of the procedure. ANESTHESIA/SEDATION: Moderate  (conscious) sedation was employed during this procedure. A total of Versed 4 mg and Fentanyl 100 mcg was administered intravenously. Moderate Sedation Time: 20 minutes. The patient's level of consciousness and vital signs were monitored continuously by radiology nursing throughout the procedure under my direct supervision. CONTRAST:  None COMPLICATIONS: None immediate. PROCEDURE: Informed written consent was obtained from the patient and/or patient's representative after a discussion of the risks, benefits and alternatives to treatment. The patient was placed prone on the CT gantry and a pre procedural CT was performed re-demonstrating the known abscess/fluid collection within the retro uterine space. The procedure was planned. A timeout was performed prior to the initiation of the procedure. The LEFT gluteus and flank was prepped and draped in the usual sterile fashion. The overlying soft tissues were anesthetized with 1% lidocaine with epinephrine. Appropriate trajectory was planned with the use of a 22 gauge spinal needle. An 18 gauge trocar needle was advanced into the abscess/fluid collection and a short Amplatz super stiff wire was coiled within the collection. Appropriate positioning was confirmed with a limited CT scan. The tract was serially dilated allowing placement of a 12 [place] all-purpose drainage catheter. Appropriate positioning was confirmed with a limited postprocedural CT scan. 5 ml of serosanguineous fluid was aspirated. Samples were sent to the laboratory as requested by the ordering clinical team. The tube was connected to a bulb suction and sutured in place. A dressing was placed. The patient tolerated the procedure well without immediate post procedural complication. IMPRESSION: Successful CT guided placement of a 12 Fr drainage catheter into the pelvic abscess via a LEFT transgluteal approach, as above. Roanna Banning, MD [facility] Radiology  Electronically Signed   By: Roanna Banning M.D.   On: 08/17/2021 21:41    Anti-infectives: Anti-infectives (From admission, onward)    Start     Dose/Rate Route Frequency Ordered Stop   08/10/21 1200  piperacillin-tazobactam (ZOSYN) IVPB 3.375 g        3.375 g 12.5 mL/hr over 240 Minutes Intravenous Every 8 hours 08/10/21 1044     08/10/21 0915  piperacillin-tazobactam (ZOSYN) IVPB 3.375 g  Status:  Discontinued        3.375 g 12.5 mL/hr over 240 Minutes Intravenous Every 8 hours 08/10/21 0815 08/10/21 1044   08/10/21 0900  piperacillin-tazobactam (ZOSYN) IVPB 3.375 g  Status:  Discontinued        3.375 g 100 mL/hr over 30 Minutes Intravenous Every 8 hours 08/10/21 0810 08/10/21 0815   08/09/21 2000  piperacillin-tazobactam (ZOSYN) IVPB 3.375 g  Status:  Discontinued        3.375 g 12.5 mL/hr over 240 Minutes Intravenous Every 8 hours 08/09/21 1146 08/09/21 2210   08/09/21 1115  piperacillin-tazobactam (ZOSYN) IVPB 3.375 g        3.375 g 100 mL/hr over 30 Minutes Intravenous  Once 08/09/21 1105 08/09/21 1149       Assessment/Plan: s/p Procedure(s): LAPAROSCOPIC CONVERTED TO OPEN ILEOSEECTOMY (N/A) Advance diet D/c jp placed at time of surgery Plan for d/c tomorrow POD 9 s/p diagnostic laparoscopy converted to open ileocecectomy with ileocolic anastomosis for Acute perforated appendicitis with feculent peritonitis by Dr. Cliffton Asters on 08/09/21 - CT 2/27 CT scan showed bilobed 6.6 x 3.6 x 3.7 cm rectouterine pouch abscess, and a developing 3x 2 x 3 cm region of fluid and gas inferior to the umbilicus. - Continue IV abx. Will ask IR to evaluate for possible drain placement vs aspiration.  - Cont JP drain. Currently SS - Daily dry dressing changes.  And as needed.  Monitor for wound infection   FEN - NPO for possible procedure VTE - SCDs, subq heparin ID - Zosyn 2/20 >> Foley -removed   ABL anemia - hgb stable AKI - Cr normalized OSA - CPAP at night. Weaned to RA. Denies CP or  SOB HTN - resume home meds Thickened endometrium - recommend follow up with GYN outpatient. Discussed with patient.  RML pulm nodule - nonsmoker. Recommend PCP f/u to see if any f/u imaging is needed. Discussed with patient. Patient just moved from [place] in June and does not have a PCP. She does have Medicare. I have asked TOC to help arrange PCP follow-up as outpatient.   LOS: 9 days    Heidi Peterson III 08/18/2021

## 2021-08-18 NOTE — Plan of Care (Addendum)
Removed JP drain to R abdomen and changed dressing to abdomen per order. Pt tolerated well. ?Incision clean/dry, attached edges, staples intact. ? ?Problem: Education: ?Goal: Knowledge of General Education information will improve ?Description: Including pain rating scale, medication(s)/side effects and non-pharmacologic comfort measures ?Outcome: Progressing ?  ?Problem: Health Behavior/Discharge Planning: ?Goal: Ability to manage health-related needs will improve ?Outcome: Progressing ?  ?Problem: Clinical Measurements: ?Goal: Ability to maintain clinical measurements within normal limits will improve ?Outcome: Progressing ?  ?Problem: Activity: ?Goal: Risk for activity intolerance will decrease ?Outcome: Progressing ?  ?Problem: Nutrition: ?Goal: Adequate nutrition will be maintained ?Outcome: Progressing ?  ?Problem: Coping: ?Goal: Level of anxiety will decrease ?Outcome: Progressing ?  ?Problem: Elimination: ?Goal: Will not experience complications related to bowel motility ?Outcome: Progressing ?  ?Problem: Pain Managment: ?Goal: General experience of comfort will improve ?Outcome: Progressing ?  ?Problem: Safety: ?Goal: Ability to remain free from injury will improve ?Outcome: Progressing ?  ?

## 2021-08-18 NOTE — Progress Notes (Signed)
Referring Physician(s): Toth,P  Supervising Physician: Pernell Dupre  Patient Status:  [facility] - In-pt  Chief Complaint: Pelvic abscess  Subjective: Pt doing ok this am; denies N/V; has some soreness at left TG drain site as expected   Allergies: Patient has no known allergies.  Medications: Prior to Admission medications   Medication Sig Start Date End Date Taking? Authorizing Provider  acetaminophen (TYLENOL) 500 MG tablet Take 500 mg by mouth every 8 (eight) hours as needed (pain).   Yes [provider]  ASPERCREME LIDOCAINE EX Apply 1 application topically at bedtime as needed (knee pain/arthritis).   Yes [provider]  BIOTIN PO Take 1 tablet by mouth every morning.   Yes [provider]  Glucosamine-Chondroitin (OSTEO BI-FLEX REGULAR STRENGTH PO) Take 1 tablet by mouth at bedtime.   Yes [provider]  lisinopril (ZESTRIL) 40 MG tablet Take 40 mg by mouth every morning.   Yes [provider]  metoprolol succinate (TOPROL-XL) 50 MG 24 hr tablet Take 50 mg by mouth every morning. Take with or immediately following a meal.   Yes [provider]  Multiple Vitamin (MULTIVITAMIN WITH MINERALS) TABS tablet Take 1 tablet by mouth every morning.   Yes [provider]  naproxen sodium (ALEVE) 220 MG tablet Take 220 mg by mouth every 8 (eight) hours as needed (pain).   Yes [provider]  Omega-3 Fatty Acids (OMEGA-3 PO) Take 1 capsule by mouth every morning.   Yes [provider]  polyvinyl alcohol (ARTIFICIAL TEARS) 1.4 % ophthalmic solution Place 1 drop into both eyes at bedtime as needed for dry eyes.   Yes [provider]  PRESCRIPTION MEDICATION Inhale into the lungs See admin instructions. CPAP - use at night and whenever resting   Yes [provider]     Vital Signs: BP (!) 151/60 (BP Location: Right Arm)    Pulse (!) 58    Temp 98.5 F (36.9 C) (Oral)    Resp 17    Ht 5'  2" (1.575 m)    Wt 212 lb (96.2 kg)    SpO2 96%    BMI 38.78 kg/m   Physical Exam awake/alert; 12 fr left TG drain intact, dressing dry, site mildly tender, OP 25 cc blood-tinged fluid; drain flushed without difficulty; currently to JP bulb  Imaging: CT ABDOMEN PELVIS W CONTRAST  Result Date: 08/16/2021 CLINICAL DATA:  Abdominal pain, post-op s/p ileocecectomy for perforated appendicitis, rule out abscess, WBC 18K EXAM: CT ABDOMEN AND PELVIS WITH CONTRAST TECHNIQUE: Multidetector CT imaging of the abdomen and pelvis was performed using the standard protocol following bolus administration of intravenous contrast. RADIATION DOSE REDUCTION: This exam was performed according to the departmental dose-optimization program which includes automated exposure control, adjustment of the mA and/or kV according to patient size and/or use of iterative reconstruction technique. CONTRAST:  OMNIPAQUE IOHEXOL 300 MG/ML  SOLN COMPARISON:  CT abdomen pelvis 08/09/2021 FINDINGS: Lower chest: Bilateral trace pleural effusions. Associated bilateral lobe passive atelectasis. Hepatobiliary: A stable 1.8 cm fluid dense lesion within the left hepatic lobe likely represents a simple hepatic cyst. Otherwise no focal liver abnormality. No gallstones, gallbladder wall thickening, or pericholecystic fluid. No biliary dilatation. Pancreas: No focal lesion. Normal pancreatic contour. No surrounding inflammatory changes. No main pancreatic ductal dilatation. Spleen: Similar-appearing subcentimeter splenic hypodensity. Otherwise normal in size without focal abnormality. A splenule is noted. Adrenals/Urinary Tract: No adrenal nodule bilaterally. Bilateral kidneys enhance symmetrically. There is a stable 6  mm calcified stone within the left kidney. Couple other punctate calcified stones within the left kidney. Right nephrolithiasis measuring up to 4 mm. A 2.3 cm fluid density lesion within left kidney likely represents a simple renal cyst.  No hydronephrosis. No hydroureter. The urinary bladder is unremarkable. Stomach/Bowel: PO contrast reaches the ileum. No finding of PO contrast extravasation. Surgical changes related to an ileocecectomy. Stomach is within normal limits. No evidence of bowel wall thickening or dilatation. Scattered colonic diverticulosis. Appendix appears normal. Vascular/Lymphatic: No abdominal aorta or iliac aneurysm. Mild atherosclerotic plaque of the aorta and its branches. No abdominal, pelvic, or inguinal lymphadenopathy. Reproductive: Persistent thickened endometrium. Otherwise uterus and bilateral adnexa are unremarkable. Other: Surgical drain noted with tip terminating in the anterior left pelvis. Trace free fluid. No intraperitoneal free gas. Bilobed rectouterine pouch fluid collection with minimally enhancing peripheral walls that measures 6.6 x 3.6 x 3.7cm. The collection is noted to inseparable from the rectosigmoid colon. Musculoskeletal: Mild subcutaneus soft tissue edema. Healing anterior abdominal incision with associated region of fluid and gas inferior to the umbilicus measuring 3.3 x 2.4 x 3.2 cm. No definite surrounding peripheral enhancement. No suspicious lytic or blastic osseous lesions. No acute displaced fracture. IMPRESSION: 1. Developing bilobed 6.6 x 3.6 x 3.7 cm rectouterine pouch abscess. The collection is noted to be inseparable of the rectosigmoid colon. 2. Healing anterior abdominal incision with associated developing 3 x 2 x 3 cm region of fluid and gas inferior to the umbilicus. No definite peripheral enhancement. Finding may represent a developing subcutaneus soft tissue abscess versus surgical changes. 3. Bilateral trace pleural effusions. 4. Colonic diverticulosis with no acute diverticulitis. 5. Bilateral nonobstructive nephrolithiasis measuring up to 6 mm on the left and 4 mm on the right. 6. Thickened endometrium. Recommend nonemergent pelvic ultrasound as anunderlying malignancy is not  excluded. 7.  Aortic Atherosclerosis (ICD10-I70.0). Electronically Signed   By: Tish Frederickson M.D.   On: 08/16/2021 19:45   CT IMAGE GUIDED DRAINAGE BY PERCUTANEOUS CATHETER  Result Date: 08/17/2021 INDICATION: History of recent acute perforated appendicitis s/p lap converted open ileocecectomy, with new pelvic abscess EXAM: CT GUIDED LEFT TRANSGLUTEAL APPROACH PELVIC DRAINAGE CATHETER PLACEMENT RADIATION DOSE REDUCTION: This exam was performed according to the departmental dose-optimization program which includes automated exposure control, adjustment of the mA and/or kV according to patient size and/or use of iterative reconstruction technique. COMPARISON:  CT AP, 08/09/2021 and 08/16/2021. MEDICATIONS: The patient is currently admitted to the hospital and receiving intravenous antibiotics. The antibiotics were administered within an appropriate time frame prior to the initiation of the procedure. ANESTHESIA/SEDATION: Moderate (conscious) sedation was employed during this procedure. A total of Versed 4 mg and Fentanyl 100 mcg was administered intravenously. Moderate Sedation Time: 20 minutes. The patient's level of consciousness and vital signs were monitored continuously by radiology nursing throughout the procedure under my direct supervision. CONTRAST:  None COMPLICATIONS: None immediate. PROCEDURE: Informed written consent was obtained from the patient and/or patient's representative after a discussion of the risks, benefits and alternatives to treatment. The patient was placed prone on the CT gantry and a pre procedural CT was performed re-demonstrating the known abscess/fluid collection within the retro uterine space. The procedure was planned. A timeout was performed prior to the initiation of the procedure. The LEFT gluteus and flank was prepped and draped in the usual sterile fashion. The overlying soft tissues were anesthetized with 1% lidocaine with epinephrine. Appropriate trajectory was planned  with the use of a 22 gauge spinal needle.  An 18 gauge trocar needle was advanced into the abscess/fluid collection and a short Amplatz super stiff wire was coiled within the collection. Appropriate positioning was confirmed with a limited CT scan. The tract was serially dilated allowing placement of a 12 [place] all-purpose drainage catheter. Appropriate positioning was confirmed with a limited postprocedural CT scan. 5 ml of serosanguineous fluid was aspirated. Samples were sent to the laboratory as requested by the ordering clinical team. The tube was connected to a bulb suction and sutured in place. A dressing was placed. The patient tolerated the procedure well without immediate post procedural complication. IMPRESSION: Successful CT guided placement of a 12 Fr drainage catheter into the pelvic abscess via a LEFT transgluteal approach, as above. Roanna Banning, MD [facility] Radiology Electronically Signed   By: Roanna Banning M.D.   On: 08/17/2021 21:41    Labs:  CBC: Recent Labs    08/11/21 0318 08/13/21 0836 08/16/21 1113 08/17/21 0309  WBC 13.2* 12.2* 18.0* 14.5*  HGB 10.1* 10.3* 10.6* 9.5*  HCT 33.3* 33.6* 35.0* 31.6*  PLT 370 481* 496* 430*    COAGS: Recent Labs    08/17/21 1010  INR 1.0    BMP: Recent Labs    08/10/21 0835 08/11/21 0318 08/13/21 0836 08/17/21 0309  NA 134* 135 137 138  K 4.3 3.7 3.7 3.2*  CL 103 104 106 105  CO2 26 26 24 27   GLUCOSE 172* 117* 94 153*  BUN 22 24* 24* 14  CALCIUM 9.3 9.2 8.9 9.3  CREATININE 1.14* 0.95 0.66 0.69  GFRNONAA 52* >60 >60 >60    LIVER FUNCTION TESTS: Recent Labs    08/09/21 0820  BILITOT 0.6  AST 16  ALT 14  ALKPHOS 87  PROT 6.9  ALBUMIN 3.6    Assessment and Plan: 71 y.o. female with past medical history of nephrolithiasis who is status post diagnostic laparoscopy converted to open ileocecectomy with ileocolic anastomosis for acute perforated appendicitis with  feculent peritonitis on 08/09/2021; s/p drainage of post op pelvic abscess 2/28 (12 fr drain to JP bulb)- left TG approach; afebrile; no new labs; fluid cx pend ; OP 25 cc; cont tid drain irrigation, output monitoring, lab checks; as per Dr. 3/28 recs- f/u drain eval/sinogram in 2 weeks unless clinical status worsens in interim   Electronically Signed: D. Milford Cage, PA-C 08/18/2021, 8:49 AM   I spent a total of 15 Minutes at the the patient's bedside AND on the patient's hospital floor or unit, greater than 50% of which was counseling/coordinating care for pelvic abscess drain    Patient ID: 10/18/2021, female   DOB: 02-Apr-1951, 71 y.o.   MRN: [id number]

## 2021-08-19 LAB — CBC WITH DIFFERENTIAL/PLATELET
Abs Immature Granulocytes: 0.19 10*3/uL — ABNORMAL HIGH (ref 0.00–0.07)
Basophils Absolute: 0 10*3/uL (ref 0.0–0.1)
Basophils Relative: 0 %
Eosinophils Absolute: 0.3 10*3/uL (ref 0.0–0.5)
Eosinophils Relative: 2 %
HCT: 31.4 % — ABNORMAL LOW (ref 36.0–46.0)
Hemoglobin: 9.4 g/dL — ABNORMAL LOW (ref 12.0–15.0)
Immature Granulocytes: 1 %
Lymphocytes Relative: 13 %
Lymphs Abs: 2.2 10*3/uL (ref 0.7–4.0)
MCH: 23.8 pg — ABNORMAL LOW (ref 26.0–34.0)
MCHC: 29.9 g/dL — ABNORMAL LOW (ref 30.0–36.0)
MCV: 79.5 fL — ABNORMAL LOW (ref 80.0–100.0)
Monocytes Absolute: 0.7 10*3/uL (ref 0.1–1.0)
Monocytes Relative: 4 %
Neutro Abs: 13.1 10*3/uL — ABNORMAL HIGH (ref 1.7–7.7)
Neutrophils Relative %: 80 %
Platelets: 494 10*3/uL — ABNORMAL HIGH (ref 150–400)
RBC: 3.95 MIL/uL (ref 3.87–5.11)
RDW: 16 % — ABNORMAL HIGH (ref 11.5–15.5)
WBC: 16.5 10*3/uL — ABNORMAL HIGH (ref 4.0–10.5)
nRBC: 0 % (ref 0.0–0.2)

## 2021-08-19 MED ORDER — OXYCODONE HCL 5 MG PO TABS: 5.0000 mg | ORAL_TABLET | Freq: Four times a day (QID) | ORAL | 0 refills | Status: DC | PRN

## 2021-08-19 MED ORDER — METHOCARBAMOL 500 MG PO TABS: 500.0000 mg | ORAL_TABLET | Freq: Four times a day (QID) | ORAL | 0 refills | Status: DC | PRN

## 2021-08-19 MED ORDER — AMOXICILLIN-POT CLAVULANATE 875-125 MG PO TABS: 1.0000 | ORAL_TABLET | Freq: Two times a day (BID) | ORAL | 0 refills | Status: AC

## 2021-08-19 NOTE — Discharge Summary (Signed)
?[facility] ?Discharge Summary  ? ?Patient ID: ?Heidi Peterson ?MRN: [id number] ?DOB/AGE: 70-Oct-1952 70 y.o. ? ?Admit date: 08/09/2021 ?Discharge date: 08/19/2021 ? ?Admitting Diagnosis: ?Acute appendicitis with possible perforation ?HTN ?Thickened endometrium ?RML pulm nodule ? ?Discharge Diagnosis ?Patient Active Problem List  ? Diagnosis Date Noted  ? Acute appendicitis 08/09/2021  ? Perforated appendicitis 08/09/2021  ?POD 10, s/p diagnostic laparoscopy converted to open ileocecectomy with ileocolic anastomosis for Acute perforated appendicitis with feculent peritonitis by Dr. Cliffton Asters on 08/09/21 ?ABL anemia  ?AKI  ?OSA  ?HTN  ?Thickened endometrium  ?RML pulm nodule ? ?Consultants ?IR ? ?Imaging: ?No results found. ? ?Procedures ?Dr. Angelena Form, 08/09/21 ?Diagnostic laparoscopy converted to open ileocecectomy with ileocolic anastomosis  ? ?Hospital Course:  ?POD 10, s/p diagnostic laparoscopy converted to open ileocecectomy with ileocolic anastomosis for Acute perforated appendicitis with feculent peritonitis by Dr. Cliffton Asters on 08/09/21 ?The patient underwent the above procedure on day of admission secondary to perforated appendicitis.  This did require a laparotomy with an ileocecectomy.  She did have a surgical JP drain that was placed at the time of surgery and removed on POD 8 with no significant output.  She did received zosyn postoperatively throughout her stay.  She had her diet slowly advanced.  She had some bowel function with some loose stool that gradually improved.  Due to an increasing WBC, despite zosyn, she underwent a CT scan which revealed a fluid collection in her pelvis.  This was drained by IR and at the time of discharge the culture is negative.  She was noted to have a fluid collection in her abdominal wall, which was likely the source of her WBC, and this was probed and drained.  She was sent home with some dressing changes to her wound and her IR drain in place.  She will  also have 5 more days of oral augmentin given the wound findings.  She was otherwise stable on POD 10 for DC home. ?  ?ABL anemia  ?hgb stable post operatively with no needs for transfusions. ? ?AKI  ?Cr normalized with appropriate hydration ? ?OSA  ?CPAP at night. Weaned to RA. Denies CP or SOB ? ?HTN  ?resume home meds ? ?Thickened endometrium  ?Recommend follow up with GYN outpatient. Discussed with patient.  ? ?RML pulm nodule  ?Nonsmoker. Recommend PCP f/u to see if any f/u imaging is needed. Discussed with patient. Patient just moved from [place] in June and does not have a PCP. She does have Medicare. I have asked TOC to help arrange PCP follow-up as outpatient, but given she has insurance she will need to locate a PCP on her own.   ? ?Physical Exam: ?General:  Alert, NAD, pleasant, comfortable ?Abd:  Soft, ND, appropriately tender, midline wound probed and some foul smelling purulent drainage was evacuated.  A WD dressing was placed over this site.  IR drain with serosang output.   ? ?Allergies as of 08/19/2021   ?No Known Allergies ?  ? ?  ?Medication List  ?  ? ?TAKE these medications   ? ?acetaminophen 500 MG tablet ?Commonly known as: TYLENOL ?Take 500 mg by mouth every 8 (eight) hours as needed (pain). ?  ?amoxicillin-clavulanate 875-125 MG tablet ?Commonly known as: Augmentin ?Take 1 tablet by mouth 2 (two) times daily for 5 days. ?  ?Artificial Tears 1.4 % ophthalmic solution ?Generic drug: polyvinyl alcohol ?Place 1 drop into both eyes at bedtime as needed for dry eyes. ?  ?ASPERCREME LIDOCAINE  EX ?Apply 1 application topically at bedtime as needed (knee pain/arthritis). ?  ?BIOTIN PO ?Take 1 tablet by mouth every morning. ?  ?lisinopril 40 MG tablet ?Commonly known as: ZESTRIL ?Take 40 mg by mouth every morning. ?  ?methocarbamol 500 MG tablet ?Commonly known as: ROBAXIN ?Take 1 tablet (500 mg total) by mouth every 6 (six) hours as needed for muscle spasms. ?  ?metoprolol succinate 50 MG 24 hr  tablet ?Commonly known as: TOPROL-XL ?Take 50 mg by mouth every morning. Take with or immediately following a meal. ?  ?multivitamin with minerals Tabs tablet ?Take 1 tablet by mouth every morning. ?  ?naproxen sodium 220 MG tablet ?Commonly known as: ALEVE ?Take 220 mg by mouth every 8 (eight) hours as needed (pain). ?  ?OMEGA-3 PO ?Take 1 capsule by mouth every morning. ?  ?OSTEO BI-FLEX REGULAR STRENGTH PO ?Take 1 tablet by mouth at bedtime. ?  ?oxyCODONE 5 MG immediate release tablet ?Commonly known as: Oxy IR/ROXICODONE ?Take 1-2 tablets (5-10 mg total) by mouth every 6 (six) hours as needed. ?  ?PRESCRIPTION MEDICATION ?Inhale into the lungs See admin instructions. CPAP - use at night and whenever resting ?  ? ?  ? ? ? ? Follow-up Information   ? ? Roanna Banning, MD Follow up.   ?Specialties: Interventional Radiology, Diagnostic Radiology, Radiology ?Why: Their office should contact you to arrange follow up in drain clinic ?Contact information: ?[address] ?[address] ?[place] [address] ?[phone] ? ? ?  ?  ? ? Andria Meuse, MD Follow up on 09/20/2021.   ?Specialties: General Surgery, Colon and Rectal Surgery ?Why: 9:10am, arrive by 8:40am for paperwork and check in process.  please bring photo ID and insurance card. ?Contact information: ?[address] ?[place] [address] ?[phone] ? ? ?  ?  ? ? obtain PCP Follow up.   ?Why: For Follow up of lung nodule ? ?  ?  ? ? obtain OB/GYN Follow up.   ?Why: for follow up of thickened endometrium ? ?  ?  ? ?  ?  ? ?  ? ? ?Signed: ?Letha Cape PA-C ?[facility] ?08/19/2021, 3:46 PM ?Please see Amion for pager number during day hours 7:00am-4:30pm ? ?

## 2021-08-19 NOTE — Discharge Instructions (Addendum)
Midline incision with small opening between staples at distal aspect. Wick open with corner of 4x4 gauze, apply dry gauze on top and secure with tape. Change twice daily and as needed if it becomes saturated. Shower with wound open.  ? ?[facility]      [facility], [place] ?[phone] ? ?OPEN ABDOMINAL SURGERY: POST OP INSTRUCTIONS ? ?Always review your discharge instruction sheet given to you by the facility where your surgery was performed. ? ?IF YOU HAVE DISABILITY OR FAMILY LEAVE FORMS, YOU MUST BRING THEM TO THE OFFICE FOR PROCESSING.  PLEASE DO NOT GIVE THEM TO YOUR DOCTOR. ? ?A prescription for pain medication may be given to you upon discharge.  Take your pain medication as prescribed, if needed.  If narcotic pain medicine is not needed, then you may take acetaminophen (Tylenol) or ibuprofen (Advil) as needed. ?Take your usually prescribed medications unless otherwise directed. ?If you need a refill on your pain medication, please contact your pharmacy. They will contact our office to request authorization.  Prescriptions will not be filled after 5pm or on week-ends. ?You should follow a light diet the first few days after arrival home, such as soup and crackers, pudding, etc.unless your doctor has advised otherwise. A high-fiber, low fat diet can be resumed as tolerated.   Be sure to include lots of fluids daily. Most patients will experience some swelling and bruising on the chest and neck area.  Ice packs will help.  Swelling and bruising can take several days to resolve ?Most patients will experience some swelling and bruising in the area of the incision. Ice pack will help. Swelling and bruising can take several days to resolve.Marland Kitchen  ?It is common to experience some constipation if taking pain medication after surgery.  Increasing fluid intake and taking a stool softener will usually help or prevent this problem from occurring.  A mild laxative (Milk of Magnesia or Miralax) should be taken according to  package directions if there are no bowel movements after 48 hours. ? You may have steri-strips (small skin tapes) in place directly over the incision.  These strips should be left on the skin for 7-10 days.  If your surgeon used skin glue on the incision, you may shower in 24 hours.  The glue will flake off over the next 2-3 weeks.  Any sutures or staples will be removed at the office during your follow-up visit. You may find that a light gauze bandage over your incision may keep your staples from being rubbed or pulled. You may shower and replace the bandage daily. ?ACTIVITIES:  You may resume regular (light) daily activities beginning the next day--such as daily self-care, walking, climbing stairs--gradually increasing activities as tolerated.  You may have sexual intercourse when it is comfortable.  Refrain from any heavy lifting or straining until approved by your doctor. ?You may drive when you no longer are taking prescription pain medication, you can comfortably wear a seatbelt, and you can safely maneuver your car and apply brakes ?You should see your doctor in the office for a follow-up appointment approximately two weeks after your surgery.  Make sure that you call for this appointment within a day or two after you arrive home to insure a convenient appointment time. ? ?WHEN TO CALL YOUR DOCTOR: ?Fever over 101.0 ?Inability to urinate ?Nausea and/or vomiting ?Extreme swelling or bruising ?Continued bleeding from incision. ?Increased pain, redness, or drainage from the incision. ?Difficulty swallowing or breathing ?Muscle cramping or spasms. ?Numbness or tingling in  hands or feet or around lips. ? ?The clinic staff is available to answer your questions during regular business hours.  Please don?t hesitate to call and ask to speak to one of the nurses if you have concerns. ? ?For further questions, please visit [email or web address] ? ?** Flush drain with 5 cc normal saline daily. ?    Please  document daily output and bring to your follow-up appointment.  ?    Keep dressing over drain clean and dry, changing as needed.  ? ?

## 2021-08-19 NOTE — Progress Notes (Signed)
Discharge instructions given to patient and husband, including wound care to abdomin and IR drain care. Dressings supplies and saline flushes sent with patient.  D FranklinRN ?

## 2021-08-20 ENCOUNTER — Other Ambulatory Visit: Payer: Self-pay | Admitting: Surgery

## 2021-08-20 DIAGNOSIS — R188 Other ascites: Secondary | ICD-10-CM

## 2021-08-22 LAB — AEROBIC/ANAEROBIC CULTURE W GRAM STAIN (SURGICAL/DEEP WOUND)

## 2021-08-25 ENCOUNTER — Ambulatory Visit
Admission: RE | Admit: 2021-08-25 | Discharge: 2021-08-25 | Disposition: A | Discharge status: Home / Self Care | Payer: Medicare Other | Source: Ambulatory Visit | Attending: Internal Medicine | Admitting: Internal Medicine

## 2021-08-25 ENCOUNTER — Ambulatory Visit
Admission: RE | Admit: 2021-08-25 | Discharge: 2021-08-25 | Disposition: A | Discharge status: Home / Self Care | Payer: Medicare Other | Source: Ambulatory Visit | Attending: Surgery | Admitting: Surgery

## 2021-08-25 DIAGNOSIS — R188 Other ascites: Secondary | ICD-10-CM

## 2021-08-25 HISTORY — PX: IR RADIOLOGIST EVAL & MGMT: IMG5224

## 2021-08-25 MED ORDER — IOPAMIDOL (ISOVUE-300) INJECTION 61%
100.0000 mL | Freq: Once | INTRAVENOUS | Status: AC | PRN
  Administered 2021-08-25: 100 mL via INTRAVENOUS

## 2021-08-25 NOTE — Progress Notes (Signed)
Referring Physician(s): Clapp,Susan T  Chief Complaint: The patient is seen in follow up today s/p pelvic abscess drain placement  History of present illness:  Ms. Heidi Peterson had perforated appendicitis with open ileocecectomy with ileocolic anastomosis performed on 08/09/21.  She developed a post surgical fluid collection in her pelvis with drain placement on 08/17/21.  She completed her antibiotic yesterday.  She is flushing the catheter daily and has minimal daily OP.  Denies fever, chills, N/V, or pain.  She presents today for follow up of drain.   Past Medical History:  Diagnosis Date   Kidney stones     Past Surgical History:  Procedure Laterality Date   IR RADIOLOGIST EVAL & MGMT  08/25/2021   LAPAROSCOPIC APPENDECTOMY N/A 08/09/2021   Procedure: LAPAROSCOPIC CONVERTED TO OPEN ILEOSEECTOMY;  Surgeon: Andria Meuse, MD;  Location: [facility];  Service: General;  Laterality: N/A;    Allergies: Patient has no known allergies.  Medications: Prior to Admission medications   Medication Sig Start Date End Date Taking? Authorizing Provider  acetaminophen (TYLENOL) 500 MG tablet Take 500 mg by mouth every 8 (eight) hours as needed (pain).    [provider]  ASPERCREME LIDOCAINE EX Apply 1 application topically at bedtime as needed (knee pain/arthritis).    [provider]  BIOTIN PO Take 1 tablet by mouth every morning.    [provider]  Glucosamine-Chondroitin (OSTEO BI-FLEX REGULAR STRENGTH PO) Take 1 tablet by mouth at bedtime.    [provider]  lisinopril (ZESTRIL) 40 MG tablet Take 40 mg by mouth every morning.    [provider]  methocarbamol (ROBAXIN) 500 MG tablet Take 1 tablet (500 mg total) by mouth every 6 (six) hours as needed for muscle spasms. 08/19/21   Barnetta Chapel, PA-C  metoprolol succinate (TOPROL-XL) 50 MG 24 hr tablet Take 50 mg by mouth every morning. Take with or immediately following a meal.    [provider]  Multiple Vitamin (MULTIVITAMIN WITH MINERALS) TABS tablet Take 1 tablet by mouth every morning.    [provider]  naproxen sodium (ALEVE) 220 MG tablet Take 220 mg by mouth every 8 (eight) hours as needed (pain).    [provider]  Omega-3 Fatty Acids (OMEGA-3 PO) Take 1 capsule by mouth every morning.    [provider]  oxyCODONE (OXY IR/ROXICODONE) 5 MG immediate release tablet Take 1-2 tablets (5-10 mg total) by mouth every 6 (six) hours as needed. 08/19/21   Barnetta Chapel, PA-C  polyvinyl alcohol (ARTIFICIAL TEARS) 1.4 % ophthalmic solution Place 1 drop into both eyes at bedtime as needed for dry eyes.    [provider]  PRESCRIPTION MEDICATION Inhale into the lungs See admin instructions. CPAP - use at night and whenever resting    [provider]     No family history on file.  Social History   Socioeconomic History   Marital status: Married    Spouse name: Not on file   Number of children: Not on file   Years of education: Not on file   Highest education level: Not on file  Occupational History   Not on file  Tobacco Use   Smoking status: Never   Smokeless tobacco: Never  Substance and Sexual Activity   Alcohol use: Not on file   Drug use: Never   Sexual activity: Not on file  Other Topics Concern   Not on file  Social History Narrative   Not on file  Social Determinants of Health   Financial Resource Strain: Not on file  Food Insecurity: Not on file  Transportation Needs: Not on file  Physical Activity: Not on file  Stress: Not on file  Social Connections: Not on file     Vital Signs: There were no vitals taken for this visit.  Physical Exam  Imaging: CT ABDOMEN PELVIS W CONTRAST  Result Date: 08/25/2021 CLINICAL DATA:  Perforated appendicitis status post open ileocecectomy with ileo colic anastomosis for acute perforated appendicitis with feculent peritonitis EXAM: CT ABDOMEN AND PELVIS WITH  CONTRAST TECHNIQUE: Multidetector CT imaging of the abdomen and pelvis was performed using the standard protocol following bolus administration of intravenous contrast. RADIATION DOSE REDUCTION: This exam was performed according to the departmental dose-optimization program which includes automated exposure control, adjustment of the mA and/or kV according to patient size and/or use of iterative reconstruction technique. CONTRAST:  167mL ISOVUE-300 IOPAMIDOL (ISOVUE-300) INJECTION 61% COMPARISON:  08/16/2021 FINDINGS: Lower chest: Minor residual scattered basilar atelectasis. Small effusions have resolved. Normal heart size. No pericardial effusion. Hepatobiliary: Stable 1.8 cm anterior lateral left hepatic subcapsular cyst. Gallbladder and biliary tree unremarkable. No focal hepatic abnormality. Trace right lateral liver subcapsular fluid measures 15 mm, image 32. Pancreas: Unremarkable. No pancreatic ductal dilatation or surrounding inflammatory changes. Spleen: Stable peripheral subcapsular splenic hypodensity measuring 8 mm. No other splenic abnormality. Spleen normal in size. No surrounding free fluid. Adrenals/Urinary Tract: Normal adrenal glands. Stable 2.3 cm anterior mid pole left renal cyst. Radiopaque subcentimeter nephrolithiasis again noted bilaterally. On the left, there is a renal pelvis nonobstructing stone measuring 8 mm, image 34/2. On the right, there are scattered subcentimeter nonobstructing intrarenal calculi. There is also a right UPJ calculus measuring 7 mm, image 44/2. Very minimal right pelviectasis. No significant obstructive hydronephrosis. Ureters are both decompressed. No hydroureter. Bladder nondistended. Stomach/Bowel: Negative for bowel obstruction, significant dilatation, ileus, or free air. Postop changes in the right lower quadrant. There is some residual right lower quadrant adjacent strandy edema, presumed postoperative. No surrounding anastomotic fluid collection. No new  abdominopelvic fluid collections. Posterior left transgluteal drain is stable in position. Small fluid collection at the drain site has resolved. Vascular/Lymphatic: Aortoiliac atherosclerosis and tortuosity. No acute vascular finding. Mesenteric and renal vasculature all appear patent. No veno-occlusive process. No bulky adenopathy. Reproductive: Uterus and bilateral adnexa are unremarkable. Other: Abdominal wall midline staples noted. Midline surgical incision ill-defined fluid and air measures 3.1 x 3.0 cm, similar to the prior study. Suspect a component of this has drain to the skin surface. No defined collection that warrants drainage or aspiration. Musculoskeletal: Degenerative changes of the SI joints and lumbosacral spine. Lower lumbar facet arthropathy. No acute osseous finding. IMPRESSION: Resolved pelvic cul-de-sac fluid collection or abscess at the drain catheter site. Stable drain catheter position. Stable postoperative findings in the right lower quadrant following perforated appendicitis. No surrounding anastomotic fluid collection or abnormality. No new abdominal or pelvic fluid collections of significance. Trace subcapsular focal fluid along the right lateral liver margin measures only 15 mm. Ill-defined fluid and air beneath the midline laparotomy staples in the abdominal wall measures 3 cm. See above comment. Bilateral nephrolithiasis as detailed above. 8 mm left renal pelvis nonobstructing stone noted. 7 mm right UPJ calculus evident with minor proximal pelviectasis. This stone could be partially obstructing. Aortic Atherosclerosis (ICD10-I70.0). Electronically Signed   By: Jerilynn Mages.  Shick M.D.   On: 08/25/2021 13:56   IR Radiologist Eval & Mgmt  Result Date: 08/25/2021 Please refer to notes  tab for details about interventional procedure. (Op Note)   Labs:  CBC: Recent Labs    08/13/21 0836 08/16/21 1113 08/17/21 0309 08/19/21 0301  WBC 12.2* 18.0* 14.5* 16.5*  HGB 10.3* 10.6* 9.5* 9.4*   HCT 33.6* 35.0* 31.6* 31.4*  PLT 481* 496* 430* 494*    COAGS: Recent Labs    08/17/21 1010  INR 1.0    BMP: Recent Labs    08/10/21 0835 08/11/21 0318 08/13/21 0836 08/17/21 0309  NA 134* 135 137 138  K 4.3 3.7 3.7 3.2*  CL 103 104 106 105  CO2 26 26 24 27   GLUCOSE 172* 117* 94 153*  BUN 22 24* 24* 14  CALCIUM 9.3 9.2 8.9 9.3  CREATININE 1.14* 0.95 0.66 0.69  GFRNONAA 52* >60 >60 >60    LIVER FUNCTION TESTS: Recent Labs    08/09/21 0820  BILITOT 0.6  AST 16  ALT 14  ALKPHOS 87  PROT 6.9  ALBUMIN 3.6    Assessment:  Pelvic abscess --Clinically improved --CT demonstrates resolution of collection    --incidentally noted movement of renal calculus in the right kidney into the ureteropelvic junction, non-obstructing, and reportedly asymptomatic --Drain injection reveals no fistula --Drain removed today without complication and site dressed --Return to clinic precautions discussed  SignedPasty Spillers, PA 08/25/2021, 2:30 PM   Please refer to Dr. Annamaria Boots attestation of this note for management and plan.

## 2021-08-31 ENCOUNTER — Other Ambulatory Visit: Payer: Medicare Other

## 2021-11-12 ENCOUNTER — Encounter: Payer: Self-pay | Admitting: Surgery

## 2022-04-27 ENCOUNTER — Inpatient Hospital Stay (HOSPITAL_COMMUNITY): Payer: Medicare Other

## 2022-04-27 ENCOUNTER — Encounter (HOSPITAL_COMMUNITY)
Admission: EM | Disposition: A | Discharge status: Home / Self Care | Payer: Self-pay | Source: Home / Self Care | Attending: Family Medicine

## 2022-04-27 ENCOUNTER — Inpatient Hospital Stay (HOSPITAL_COMMUNITY): Payer: Medicare Other | Admitting: Anesthesiology

## 2022-04-27 ENCOUNTER — Emergency Department (HOSPITAL_COMMUNITY): Payer: Medicare Other

## 2022-04-27 ENCOUNTER — Inpatient Hospital Stay (HOSPITAL_COMMUNITY)
Admission: EM | Admit: 2022-04-27 | Discharge: 2022-05-05 | DRG: 988 | Disposition: A | Discharge status: Home / Self Care | Payer: Medicare Other | Attending: Internal Medicine | Admitting: Internal Medicine

## 2022-04-27 ENCOUNTER — Encounter (HOSPITAL_COMMUNITY): Payer: Self-pay

## 2022-04-27 ENCOUNTER — Other Ambulatory Visit: Payer: Self-pay

## 2022-04-27 DIAGNOSIS — B9620 Unspecified Escherichia coli [E. coli] as the cause of diseases classified elsewhere: Secondary | ICD-10-CM | POA: Diagnosis present

## 2022-04-27 DIAGNOSIS — D509 Iron deficiency anemia, unspecified: Secondary | ICD-10-CM | POA: Diagnosis present

## 2022-04-27 DIAGNOSIS — K760 Fatty (change of) liver, not elsewhere classified: Secondary | ICD-10-CM | POA: Diagnosis present

## 2022-04-27 DIAGNOSIS — Z884 Allergy status to anesthetic agent status: Secondary | ICD-10-CM | POA: Diagnosis not present

## 2022-04-27 DIAGNOSIS — E876 Hypokalemia: Secondary | ICD-10-CM | POA: Diagnosis not present

## 2022-04-27 DIAGNOSIS — N739 Female pelvic inflammatory disease, unspecified: Secondary | ICD-10-CM | POA: Diagnosis present

## 2022-04-27 DIAGNOSIS — N1330 Unspecified hydronephrosis: Secondary | ICD-10-CM | POA: Diagnosis not present

## 2022-04-27 DIAGNOSIS — R109 Unspecified abdominal pain: Secondary | ICD-10-CM | POA: Diagnosis not present

## 2022-04-27 DIAGNOSIS — N136 Pyonephrosis: Secondary | ICD-10-CM | POA: Diagnosis present

## 2022-04-27 DIAGNOSIS — N200 Calculus of kidney: Secondary | ICD-10-CM | POA: Diagnosis present

## 2022-04-27 DIAGNOSIS — K5720 Diverticulitis of large intestine with perforation and abscess without bleeding: Principal | ICD-10-CM | POA: Diagnosis present

## 2022-04-27 DIAGNOSIS — N132 Hydronephrosis with renal and ureteral calculous obstruction: Secondary | ICD-10-CM

## 2022-04-27 DIAGNOSIS — D649 Anemia, unspecified: Secondary | ICD-10-CM | POA: Diagnosis not present

## 2022-04-27 DIAGNOSIS — I10 Essential (primary) hypertension: Secondary | ICD-10-CM

## 2022-04-27 DIAGNOSIS — Z886 Allergy status to analgesic agent status: Secondary | ICD-10-CM | POA: Diagnosis not present

## 2022-04-27 DIAGNOSIS — Z79899 Other long term (current) drug therapy: Secondary | ICD-10-CM | POA: Diagnosis not present

## 2022-04-27 DIAGNOSIS — R739 Hyperglycemia, unspecified: Secondary | ICD-10-CM | POA: Diagnosis present

## 2022-04-27 DIAGNOSIS — K5780 Diverticulitis of intestine, part unspecified, with perforation and abscess without bleeding: Secondary | ICD-10-CM

## 2022-04-27 DIAGNOSIS — N390 Urinary tract infection, site not specified: Secondary | ICD-10-CM

## 2022-04-27 DIAGNOSIS — K5792 Diverticulitis of intestine, part unspecified, without perforation or abscess without bleeding: Secondary | ICD-10-CM | POA: Insufficient documentation

## 2022-04-27 DIAGNOSIS — Z6841 Body Mass Index (BMI) 40.0 and over, adult: Secondary | ICD-10-CM | POA: Diagnosis not present

## 2022-04-27 DIAGNOSIS — N3289 Other specified disorders of bladder: Secondary | ICD-10-CM | POA: Diagnosis not present

## 2022-04-27 DIAGNOSIS — N139 Obstructive and reflux uropathy, unspecified: Secondary | ICD-10-CM | POA: Diagnosis not present

## 2022-04-27 DIAGNOSIS — E66813 Obesity, class 3: Secondary | ICD-10-CM

## 2022-04-27 HISTORY — PX: CYSTOSCOPY W/ URETERAL STENT PLACEMENT: SHX1429

## 2022-04-27 LAB — CBC WITH DIFFERENTIAL/PLATELET
Abs Immature Granulocytes: 0.07 10*3/uL (ref 0.00–0.07)
Basophils Absolute: 0 10*3/uL (ref 0.0–0.1)
Basophils Relative: 0 %
Eosinophils Absolute: 0.1 10*3/uL (ref 0.0–0.5)
Eosinophils Relative: 1 %
HCT: 35.4 % — ABNORMAL LOW (ref 36.0–46.0)
Hemoglobin: 10.6 g/dL — ABNORMAL LOW (ref 12.0–15.0)
Immature Granulocytes: 1 %
Lymphocytes Relative: 12 %
Lymphs Abs: 1.5 10*3/uL (ref 0.7–4.0)
MCH: 23.1 pg — ABNORMAL LOW (ref 26.0–34.0)
MCHC: 29.9 g/dL — ABNORMAL LOW (ref 30.0–36.0)
MCV: 77.3 fL — ABNORMAL LOW (ref 80.0–100.0)
Monocytes Absolute: 0.6 10*3/uL (ref 0.1–1.0)
Monocytes Relative: 5 %
Neutro Abs: 10.2 10*3/uL — ABNORMAL HIGH (ref 1.7–7.7)
Neutrophils Relative %: 81 %
Platelets: 501 10*3/uL — ABNORMAL HIGH (ref 150–400)
RBC: 4.58 MIL/uL (ref 3.87–5.11)
RDW: 16.5 % — ABNORMAL HIGH (ref 11.5–15.5)
WBC: 12.6 10*3/uL — ABNORMAL HIGH (ref 4.0–10.5)
nRBC: 0 % (ref 0.0–0.2)

## 2022-04-27 LAB — IRON AND TIBC
Iron: 13 ug/dL — ABNORMAL LOW (ref 28–170)
Saturation Ratios: 6 % — ABNORMAL LOW (ref 10.4–31.8)
TIBC: 235 ug/dL — ABNORMAL LOW (ref 250–450)
UIBC: 222 ug/dL

## 2022-04-27 LAB — COMPREHENSIVE METABOLIC PANEL
ALT: 17 U/L (ref 0–44)
AST: 18 U/L (ref 15–41)
Albumin: 3.2 g/dL — ABNORMAL LOW (ref 3.5–5.0)
Alkaline Phosphatase: 85 U/L (ref 38–126)
Anion gap: 8 (ref 5–15)
BUN: 16 mg/dL (ref 8–23)
CO2: 23 mmol/L (ref 22–32)
Calcium: 9.7 mg/dL (ref 8.9–10.3)
Chloride: 105 mmol/L (ref 98–111)
Creatinine, Ser: 0.91 mg/dL (ref 0.44–1.00)
GFR, Estimated: >60 mL/min (ref >60)
Glucose, Bld: 195 mg/dL — ABNORMAL HIGH (ref 70–99)
Potassium: 4.2 mmol/L (ref 3.5–5.1)
Sodium: 136 mmol/L (ref 135–145)
Total Bilirubin: 0.6 mg/dL (ref 0.3–1.2)
Total Protein: 7.2 g/dL (ref 6.5–8.1)

## 2022-04-27 LAB — URINALYSIS, ROUTINE W REFLEX MICROSCOPIC
Bilirubin Urine: NEGATIVE
Glucose, UA: NEGATIVE mg/dL
Hgb urine dipstick: NEGATIVE
Ketones, ur: 5 mg/dL — AB
Nitrite: NEGATIVE
Protein, ur: 100 mg/dL — AB
Specific Gravity, Urine: 1.014 (ref 1.005–1.030)
WBC, UA: >50 WBC/hpf — ABNORMAL HIGH (ref 0–5)
pH: 5 (ref 5.0–8.0)

## 2022-04-27 LAB — LIPASE, BLOOD: Lipase: 26 U/L (ref 11–51)

## 2022-04-27 SURGERY — CYSTOSCOPY, WITH RETROGRADE PYELOGRAM AND URETERAL STENT INSERTION
Anesthesia: General | Site: Urethra | Laterality: Bilateral

## 2022-04-27 MED ORDER — SUCCINYLCHOLINE CHLORIDE 200 MG/10ML IV SOSY
PREFILLED_SYRINGE | INTRAVENOUS | Status: AC
  Filled 2022-04-27: qty 10

## 2022-04-27 MED ORDER — LIDOCAINE HCL (PF) 2 % IJ SOLN
INTRAMUSCULAR | Status: AC
  Filled 2022-04-27: qty 5

## 2022-04-27 MED ORDER — METOPROLOL TARTRATE 5 MG/5ML IV SOLN
INTRAVENOUS | Status: DC | PRN
  Administered 2022-04-27: 1 mg via INTRAVENOUS

## 2022-04-27 MED ORDER — IOHEXOL 300 MG/ML  SOLN
100.0000 mL | Freq: Once | INTRAMUSCULAR | Status: AC | PRN
  Administered 2022-04-27: 100 mL via INTRAVENOUS

## 2022-04-27 MED ORDER — CHLORHEXIDINE GLUCONATE 0.12 % MT SOLN
15.0000 mL | Freq: Once | OROMUCOSAL | Status: AC
  Administered 2022-04-27: 15 mL via OROMUCOSAL

## 2022-04-27 MED ORDER — FENTANYL CITRATE (PF) 100 MCG/2ML IJ SOLN
INTRAMUSCULAR | Status: AC
  Filled 2022-04-27: qty 2

## 2022-04-27 MED ORDER — MIDAZOLAM HCL 2 MG/2ML IJ SOLN
INTRAMUSCULAR | Status: DC | PRN
  Administered 2022-04-27: 2 mg via INTRAVENOUS

## 2022-04-27 MED ORDER — ONDANSETRON HCL 4 MG/2ML IJ SOLN
4.0000 mg | Freq: Once | INTRAMUSCULAR | Status: AC
  Administered 2022-04-27: 4 mg via INTRAVENOUS
  Filled 2022-04-27: qty 2

## 2022-04-27 MED ORDER — LACTATED RINGERS IV SOLN: INTRAVENOUS | Status: DC

## 2022-04-27 MED ORDER — IOHEXOL 9 MG/ML PO SOLN
500.0000 mL | ORAL | Status: AC
  Administered 2022-04-27: 500 mL via ORAL

## 2022-04-27 MED ORDER — OXYCODONE HCL 5 MG PO TABS: 5.0000 mg | ORAL_TABLET | Freq: Once | ORAL | Status: DC | PRN

## 2022-04-27 MED ORDER — IOHEXOL 9 MG/ML PO SOLN
ORAL | Status: AC
  Administered 2022-04-27: 500 mL via ORAL
  Filled 2022-04-27: qty 1000

## 2022-04-27 MED ORDER — ONDANSETRON HCL 4 MG/2ML IJ SOLN
INTRAMUSCULAR | Status: AC
  Filled 2022-04-27: qty 2

## 2022-04-27 MED ORDER — MORPHINE SULFATE (PF) 4 MG/ML IV SOLN
4.0000 mg | Freq: Once | INTRAVENOUS | Status: AC
  Administered 2022-04-27: 4 mg via INTRAVENOUS
  Filled 2022-04-27: qty 1

## 2022-04-27 MED ORDER — PIPERACILLIN-TAZOBACTAM 3.375 G IVPB 30 MIN
3.3750 g | Freq: Once | INTRAVENOUS | Status: AC
  Administered 2022-04-27: 3.375 g via INTRAVENOUS
  Filled 2022-04-27: qty 50

## 2022-04-27 MED ORDER — MIDAZOLAM HCL 2 MG/2ML IJ SOLN
INTRAMUSCULAR | Status: AC
  Filled 2022-04-27: qty 2

## 2022-04-27 MED ORDER — ACETAMINOPHEN 10 MG/ML IV SOLN
INTRAVENOUS | Status: AC
  Filled 2022-04-27: qty 100

## 2022-04-27 MED ORDER — PROPOFOL 10 MG/ML IV BOLUS
INTRAVENOUS | Status: AC
  Filled 2022-04-27: qty 20

## 2022-04-27 MED ORDER — DEXAMETHASONE SODIUM PHOSPHATE 10 MG/ML IJ SOLN
INTRAMUSCULAR | Status: DC | PRN
  Administered 2022-04-27: 4 mg via INTRAVENOUS

## 2022-04-27 MED ORDER — MORPHINE SULFATE (PF) 4 MG/ML IV SOLN
4.0000 mg | Freq: Once | INTRAVENOUS | Status: AC
  Administered 2022-04-29: 4 mg via INTRAVENOUS
  Filled 2022-04-27: qty 1

## 2022-04-27 MED ORDER — SODIUM CHLORIDE 0.9 % IV SOLN: INTRAVENOUS | Status: DC

## 2022-04-27 MED ORDER — PROPOFOL 10 MG/ML IV BOLUS
INTRAVENOUS | Status: DC | PRN
  Administered 2022-04-27: 160 mg via INTRAVENOUS

## 2022-04-27 MED ORDER — LIDOCAINE 2% (20 MG/ML) 5 ML SYRINGE
INTRAMUSCULAR | Status: DC | PRN
  Administered 2022-04-27: 100 mg via INTRAVENOUS

## 2022-04-27 MED ORDER — PIPERACILLIN-TAZOBACTAM 3.375 G IVPB
3.3750 g | Freq: Three times a day (TID) | INTRAVENOUS | Status: DC
  Administered 2022-04-28 – 2022-05-05 (×23): 3.375 g via INTRAVENOUS
  Filled 2022-04-27 (×23): qty 50

## 2022-04-27 MED ORDER — PROCHLORPERAZINE EDISYLATE 10 MG/2ML IJ SOLN: 10.0000 mg | Freq: Four times a day (QID) | INTRAMUSCULAR | Status: DC | PRN

## 2022-04-27 MED ORDER — PROMETHAZINE HCL 25 MG/ML IJ SOLN: 6.2500 mg | INTRAMUSCULAR | Status: DC | PRN

## 2022-04-27 MED ORDER — OXYCODONE HCL 5 MG/5ML PO SOLN: 5.0000 mg | Freq: Once | ORAL | Status: DC | PRN

## 2022-04-27 MED ORDER — IOHEXOL 300 MG/ML  SOLN
INTRAMUSCULAR | Status: DC | PRN
  Administered 2022-04-27: 10 mL via URETHRAL

## 2022-04-27 MED ORDER — SODIUM CHLORIDE 0.9 % IR SOLN
Status: DC | PRN
  Administered 2022-04-27: 3000 mL

## 2022-04-27 MED ORDER — ORAL CARE MOUTH RINSE: 15.0000 mL | Freq: Once | OROMUCOSAL | Status: AC

## 2022-04-27 MED ORDER — ONDANSETRON HCL 4 MG/2ML IJ SOLN
INTRAMUSCULAR | Status: DC | PRN
  Administered 2022-04-27: 4 mg via INTRAVENOUS

## 2022-04-27 MED ORDER — DEXAMETHASONE SODIUM PHOSPHATE 10 MG/ML IJ SOLN
INTRAMUSCULAR | Status: AC
  Filled 2022-04-27: qty 1

## 2022-04-27 MED ORDER — ACETAMINOPHEN 10 MG/ML IV SOLN
INTRAVENOUS | Status: DC | PRN
  Administered 2022-04-27: 1000 mg via INTRAVENOUS

## 2022-04-27 MED ORDER — FENTANYL CITRATE (PF) 100 MCG/2ML IJ SOLN
INTRAMUSCULAR | Status: DC | PRN
  Administered 2022-04-27 (×4): 50 ug via INTRAVENOUS

## 2022-04-27 MED ORDER — FENTANYL CITRATE PF 50 MCG/ML IJ SOSY: 25.0000 ug | PREFILLED_SYRINGE | INTRAMUSCULAR | Status: DC | PRN

## 2022-04-27 MED ORDER — SUCCINYLCHOLINE CHLORIDE 200 MG/10ML IV SOSY
PREFILLED_SYRINGE | INTRAVENOUS | Status: DC | PRN
  Administered 2022-04-27: 100 mg via INTRAVENOUS

## 2022-04-27 MED ORDER — PHENYLEPHRINE 80 MCG/ML (10ML) SYRINGE FOR IV PUSH (FOR BLOOD PRESSURE SUPPORT)
PREFILLED_SYRINGE | INTRAVENOUS | Status: AC
  Filled 2022-04-27: qty 10

## 2022-04-27 MED ORDER — PHENYLEPHRINE 80 MCG/ML (10ML) SYRINGE FOR IV PUSH (FOR BLOOD PRESSURE SUPPORT)
PREFILLED_SYRINGE | INTRAVENOUS | Status: DC | PRN
  Administered 2022-04-27 (×5): 80 ug via INTRAVENOUS

## 2022-04-27 SURGICAL SUPPLY — 12 items
BAG URO CATCHER STRL LF (MISCELLANEOUS) ×1 IMPLANT
CATH URETL OPEN END 6FR 70 (CATHETERS) ×1 IMPLANT
CLOTH BEACON ORANGE TIMEOUT ST (SAFETY) ×1 IMPLANT
GLOVE BIO SURGEON STRL SZ7.5 (GLOVE) ×1 IMPLANT
GOWN STRL REUS W/ TWL XL LVL3 (GOWN DISPOSABLE) ×1 IMPLANT
GOWN STRL REUS W/TWL XL LVL3 (GOWN DISPOSABLE) ×1
GUIDEWIRE STR DUAL SENSOR (WIRE) ×1 IMPLANT
MANIFOLD NEPTUNE II (INSTRUMENTS) ×1 IMPLANT
PACK CYSTO (CUSTOM PROCEDURE TRAY) ×1 IMPLANT
STENT URET 6FRX24 CONTOUR (STENTS) IMPLANT
TUBING CONNECTING 10 (TUBING) ×1 IMPLANT
TUBING UROLOGY SET (TUBING) IMPLANT

## 2022-04-27 NOTE — ED Provider Triage Note (Signed)
Emergency Medicine Provider Triage Evaluation Note  Heidi Peterson , a 71 y.o. female  was evaluated in triage.  Pt complains of flank pain x 3 days. Pain is mid to left sided. No fever. No dysuria. Appendectomy in February.   Review of Systems  Positive: Left flank pain and weak urine stream.  Negative: Fever.   Physical Exam  BP (!) 144/65 (BP Location: Left Arm)   Pulse 61   Temp 98.2 F (36.8 C) (Oral)   Resp 19   Ht 5' 2.5" (1.588 m)   Wt 102.1 kg   SpO2 99%   BMI 40.50 kg/m  Gen:   Awake, no distress but appears uncomfortable.  Resp:  Normal effort  MSK:   Moves extremities without difficulty Other:  Non-tender abdomen in all quadrants.   Medical Decision Making  Medically screening exam initiated at 7:44 AM.  Appropriate orders placed.  Heidi Peterson was informed that the remainder of the evaluation will be completed by another provider, this initial triage assessment does not replace that evaluation, and the importance of remaining in the ED until their evaluation is complete.    Maia Plan, MD 04/27/22 [phone]

## 2022-04-27 NOTE — ED Notes (Addendum)
Pt stated her pain level is rising to an 8.5. Triage RN made aware.

## 2022-04-27 NOTE — H&P (Signed)
History and Physical    Patient: Heidi Peterson GYK:[id number] DOB: 02/22/1951 DOA: 04/27/2022 DOS: the patient was seen and examined on 04/27/2022 PCP: Pa, [facility]  Patient coming from: Home  Chief Complaint:  Chief Complaint  Patient presents with   Abdominal Pain   HPI: Heidi Peterson is a 71 y.o. female with medical history significant of HTN. Presenting with abdominal and flank pain. Her symptoms started 3 days ago. She had some crampy pain in the LLQ abdomina and her left flank. She had accompanying nausea. She thought she was either constipated or passing a kidney stone. She tried stool softener. She didn't have any episodes of vomiting. She didn't have any fevers. She didn't have any pain w/ urination; however, she did have frequent urination and cloudy urine. When her symptoms did not improve today, she decided to come to the ED for evaluation. She denies any other aggravating or alleviating factors.   Review of Systems: As mentioned in the history of present illness. All other systems reviewed and are negative. Past Medical History:  Diagnosis Date   Kidney stones    Past Surgical History:  Procedure Laterality Date   IR RADIOLOGIST EVAL & MGMT  08/25/2021   LAPAROSCOPIC APPENDECTOMY N/A 08/09/2021   Procedure: LAPAROSCOPIC CONVERTED TO OPEN ILEOSEECTOMY;  Surgeon: Andria Meuse, MD;  Location: [facility];  Service: General;  Laterality: N/A;   Social History:  reports that she has never smoked. She has never used smokeless tobacco. She reports that she does not use drugs. No history on file for alcohol use.  No Known Allergies  History reviewed. No pertinent family history.  Prior to Admission medications   Medication Sig Start Date End Date Taking? Authorizing Provider  acetaminophen (TYLENOL) 500 MG tablet Take 500 mg by mouth every 8 (eight) hours as needed (pain).    [provider]  ASPERCREME LIDOCAINE EX Apply 1  application topically at bedtime as needed (knee pain/arthritis).    [provider]  BIOTIN PO Take 1 tablet by mouth every morning.    [provider]  Glucosamine-Chondroitin (OSTEO BI-FLEX REGULAR STRENGTH PO) Take 1 tablet by mouth at bedtime.    [provider]  lisinopril (ZESTRIL) 40 MG tablet Take 40 mg by mouth every morning.    [provider]  methocarbamol (ROBAXIN) 500 MG tablet Take 1 tablet (500 mg total) by mouth every 6 (six) hours as needed for muscle spasms. 08/19/21   Barnetta Chapel, PA-C  metoprolol succinate (TOPROL-XL) 50 MG 24 hr tablet Take 50 mg by mouth every morning. Take with or immediately following a meal.    [provider]  Multiple Vitamin (MULTIVITAMIN WITH MINERALS) TABS tablet Take 1 tablet by mouth every morning.    [provider]  naproxen sodium (ALEVE) 220 MG tablet Take 220 mg by mouth every 8 (eight) hours as needed (pain).    [provider]  Omega-3 Fatty Acids (OMEGA-3 PO) Take 1 capsule by mouth every morning.    [provider]  oxyCODONE (OXY IR/ROXICODONE) 5 MG immediate release tablet Take 1-2 tablets (5-10 mg total) by mouth every 6 (six) hours as needed. 08/19/21   Barnetta Chapel, PA-C  polyvinyl alcohol (ARTIFICIAL TEARS) 1.4 % ophthalmic solution Place 1 drop into both eyes at bedtime as needed for dry eyes.    [provider]  PRESCRIPTION MEDICATION Inhale into the lungs See admin instructions. CPAP - use at night and whenever resting  [provider]    Physical Exam: Vitals:   04/27/22 0733 04/27/22 1023 04/27/22 1200 04/27/22 1300  BP: (!) 144/65 124/66 (!) 171/64 (!) 157/83  Pulse: 61 73 79 85  Resp: 19 18 16 17   Temp: 98.2 F (36.8 C) 98.6 F (37 C)    TempSrc: Oral Oral    SpO2: 99% 95% 98% 95%  Weight: 102.1 kg     Height: 5' 2.5" (1.588 m)      General: 71 y.o. female resting in bed in NAD Eyes: PERRL, normal sclera ENMT: Nares patent  w/o discharge, orophaynx clear, dentition normal, ears w/o discharge/lesions/ulcers Neck: Supple, trachea midline Cardiovascular: RRR, +S1, S2, no g/r, 2/6 holosystolic murmur, equal pulses throughout Respiratory: CTABL, no w/r/r, normal WOB GI: BS+, ND, LLQ TTP, no masses noted, no organomegaly noted MSK: No e/c/c; LCVAT Neuro: A&O x 3, no focal deficits Psyc: Appropriate interaction and affect, calm/cooperative  Data Reviewed:  Results for orders placed or performed during the hospital encounter of 04/27/22 (from the past 24 hour(s))  Comprehensive metabolic panel     Status: Abnormal   Collection Time: 04/27/22  7:53 AM  Result Value Ref Range   Sodium 136 135 - 145 mmol/L   Potassium 4.2 3.5 - 5.1 mmol/L   Chloride 105 98 - 111 mmol/L   CO2 23 22 - 32 mmol/L   Glucose, Bld 195 (H) 70 - 99 mg/dL   BUN 16 8 - 23 mg/dL   Creatinine, Ser 13/08/23 0.44 - 1.00 mg/dL   Calcium 9.7 8.9 - 9.47 mg/dL   Total Protein 7.2 6.5 - 8.1 g/dL   Albumin 3.2 (L) 3.5 - 5.0 g/dL   AST 18 15 - 41 U/L   ALT 17 0 - 44 U/L   Alkaline Phosphatase 85 38 - 126 U/L   Total Bilirubin 0.6 0.3 - 1.2 mg/dL   GFR, Estimated 65.4 >65 mL/min   Anion gap 8 5 - 15  Lipase, blood     Status: None   Collection Time: 04/27/22  7:53 AM  Result Value Ref Range   Lipase 26 11 - 51 U/L  CBC with Differential     Status: Abnormal   Collection Time: 04/27/22  7:53 AM  Result Value Ref Range   WBC 12.6 (H) 4.0 - 10.5 K/uL   RBC 4.58 3.87 - 5.11 MIL/uL   Hemoglobin 10.6 (L) 12.0 - 15.0 g/dL   HCT 13/08/23 (L) 54.6 - 56.8 %   MCV 77.3 (L) 80.0 - 100.0 fL   MCH 23.1 (L) 26.0 - 34.0 pg   MCHC 29.9 (L) 30.0 - 36.0 g/dL   RDW 12.7 (H) 51.7 - 00.1 %   Platelets 501 (H) 150 - 400 K/uL   nRBC 0.0 0.0 - 0.2 %   Neutrophils Relative % 81 %   Neutro Abs 10.2 (H) 1.7 - 7.7 K/uL   Lymphocytes Relative 12 %   Lymphs Abs 1.5 0.7 - 4.0 K/uL   Monocytes Relative 5 %   Monocytes Absolute 0.6 0.1 - 1.0 K/uL   Eosinophils Relative 1 %    Eosinophils Absolute 0.1 0.0 - 0.5 K/uL   Basophils Relative 0 %   Basophils Absolute 0.0 0.0 - 0.1 K/uL   Immature Granulocytes 1 %   Abs Immature Granulocytes 0.07 0.00 - 0.07 K/uL  Urinalysis, Routine w reflex microscopic Urine, Clean Catch     Status: Abnormal   Collection Time: 04/27/22  7:53 AM  Result Value Ref Range  Color, Urine YELLOW YELLOW   APPearance CLOUDY (A) CLEAR   Specific Gravity, Urine 1.014 1.005 - 1.030   pH 5.0 5.0 - 8.0   Glucose, UA NEGATIVE NEGATIVE mg/dL   Hgb urine dipstick NEGATIVE NEGATIVE   Bilirubin Urine NEGATIVE NEGATIVE   Ketones, ur 5 (A) NEGATIVE mg/dL   Protein, ur 478 (A) NEGATIVE mg/dL   Nitrite NEGATIVE NEGATIVE   Leukocytes,Ua LARGE (A) NEGATIVE   RBC / HPF 6-10 0 - 5 RBC/hpf   WBC, UA >50 (H) 0 - 5 WBC/hpf   Bacteria, UA MANY (A) NONE SEEN   CT renal study 1. 4.7 x 3.9 x 3.8 cm ill-defined collection of soft tissue immediately cranial to the proximal sigmoid colon. There is adjacent edema/inflammation and multiple scattered diverticuli in the left colon. Assessment is limited by lack of intravenous contrast material. Imaging features raise concern for soft tissue lesion in the left pelvis. Findings may reflect sequelae of diverticulitis and pericolonic abscess. Left ovary is not definitely seen distinct from this abnormality in left ovarian lesion is a possibility. Follow-up CT with oral and intravenous contrast recommended to further evaluate. 2. Moderate left hydronephrosis secondary to the presence of a 4 x 7 x 9 mm stone at the left UPJ. 3. Bilateral nonobstructing nephrolithiasis. 4. Hepatic steatosis. 5.  Aortic Atherosclerosis (ICD10-I70.0).  Assessment and Plan: Obstructive uropathy Complicated UTI; infected renal stone     - admit to inpt, tele     - continue zosyn, fluids     - urology to take to OR; appreciate assistance     - blood Cx, UCx     - pain control, anti-emetics  Diverticulitis w/ possible abscess      - CT ab/pelvis w/ contrast     - general surgery consulted; appreciate assistance     - continue zosyn  Microcytic anemia     - no evidence of bleed     - iron studies  Hyperglycemia     - no history of DM     - check A1c  HTN     - continue home regimen when confirmed  Morbid obesity     - counsel on diet/lifestyle changes  Advance Care Planning:   Code Status: FULL  Consults: Urology  Family Communication: None at bedside  Severity of Illness: The appropriate patient status for this patient is INPATIENT. Inpatient status is judged to be reasonable and necessary in order to provide the required intensity of service to ensure the patient's safety. The patient's presenting symptoms, physical exam findings, and initial radiographic and laboratory data in the context of their chronic comorbidities is felt to place them at high risk for further clinical deterioration. Furthermore, it is not anticipated that the patient will be medically stable for discharge from the hospital within 2 midnights of admission.   * I certify that at the point of admission it is my clinical judgment that the patient will require inpatient hospital care spanning beyond 2 midnights from the point of admission due to high intensity of service, high risk for further deterioration and high frequency of surveillance required.*  Author: Teddy Spike, DO 04/27/2022 1:51 PM  For on call review [email or web address].

## 2022-04-27 NOTE — Consult Note (Signed)
Consult Note  Heidi Peterson Aug 30, 1950  KU:[id number].    Requesting MD: Dr. Marylyn Ishihara Chief Complaint/Reason for Consult: pelvic abscess vs mass  HPI:  71 y.o. female with medical history significant for HTN, kidney stones, open ileocecectomy who presented to [facility] ED with abdominal pain and flank pain for 3 days. Pain in primarily LLQ. She states pain is similar to kidney stone pain she has had in the best but not exact and she is wondering if it could be related to finding in the pelvis described below. She has had associated intermittent nausea. No emesis. She has chills but no fever. Since admission in February her bowel movements have been irregular - fluctuating between constipation and diarrhea. Last bowel movement yesterday. She has recently had a URI with cough and shortness of breath.  Work up in ED significant for WBC of 12.6 and CT renal study with Left UPJ stone and ill defined collection of soft tissue in the pelvis. General surgery asked to see in regard to her pelvic collection.   She is known to our service with history of admission 2/20-3/2. She underwent open ileocecectomy with ileocolic anastomosis for acute perforated appendicitis with feculent peritonitis by Dr. Dema Severin on 08/09/21. Hospital stay was significant for development of fluid collection in the abdominal wall and fluid collection in the pelvis. Pelvic fluid collection was drained by IR with culture negative and drain has since been removed. Abdominal fluid collection was drained and she completed inpatient and outpatient antibiotics. She has not had abdominal pain since that time until this current episode.  She has never had a colonoscopy before.  Substance use: none Allergies: slow to metabolize anesthetics Blood thinners: none Past Surgeries: as above  ROS: Review of Systems  Constitutional:  Positive for chills. Negative for fever.  Respiratory:  Positive for cough and shortness of breath.    Cardiovascular:  Negative for chest pain and palpitations.  Gastrointestinal:  Positive for abdominal pain and nausea. Negative for vomiting.    History reviewed. No pertinent family history.  Past Medical History:  Diagnosis Date   Kidney stones     Past Surgical History:  Procedure Laterality Date   IR RADIOLOGIST EVAL & MGMT  08/25/2021   LAPAROSCOPIC APPENDECTOMY N/A 08/09/2021   Procedure: LAPAROSCOPIC CONVERTED TO OPEN ILEOSEECTOMY;  Surgeon: Ileana Roup, MD;  Location: [facility];  Service: General;  Laterality: N/A;    Social History:  reports that she has never smoked. She has never used smokeless tobacco. She reports that she does not use drugs. No history on file for alcohol use.  Allergies: No Known Allergies  (Not in a hospital admission)   Blood pressure (!) 157/83, pulse 85, temperature 98.6 F (37 C), temperature source Oral, resp. rate 17, height 5' 2.5" (1.588 m), weight 102.1 kg, SpO2 95 %. Physical Exam: General: pleasant, WD, female who is laying in bed in NAD HEENT: head is normocephalic, atraumatic.  Sclera are noninjected.  Pupils equal and round. EOMs intact.  Ears and nose without any masses or lesions.  Mouth is pink and moist Heart: regular, rate, and rhythm.  Normal s1,s2. No obvious murmurs, gallops, or rubs noted.  Palpable radial and pedal pulses bilaterally Lungs: CTAB, no wheezes, rhonchi, or rales noted.  Respiratory effort nonlabored Abd: soft, ND, +BS, no masses, hernias, or organomegaly. Minimally TTP in LLQ. Lap and midline incisions well healed MSK: all 4 extremities are symmetrical with no cyanosis, clubbing, or edema. Skin: warm and  dry with no masses, lesions, or rashes Neuro: Cranial nerves 2-12 grossly intact, sensation is normal throughout Psych: A&Ox3 with an appropriate affect.    Results for orders placed or performed during the hospital encounter of 04/27/22 (from the past 48 hour(s))  Comprehensive metabolic panel      Status: Abnormal   Collection Time: 04/27/22  7:53 AM  Result Value Ref Range   Sodium 136 135 - 145 mmol/L   Potassium 4.2 3.5 - 5.1 mmol/L   Chloride 105 98 - 111 mmol/L   CO2 23 22 - 32 mmol/L   Glucose, Bld 195 (H) 70 - 99 mg/dL    Comment: Glucose reference range applies only to samples taken after fasting for at least 8 hours.   BUN 16 8 - 23 mg/dL   Creatinine, Ser 6.76 0.44 - 1.00 mg/dL   Calcium 9.7 8.9 - 72.0 mg/dL   Total Protein 7.2 6.5 - 8.1 g/dL   Albumin 3.2 (L) 3.5 - 5.0 g/dL   AST 18 15 - 41 U/L   ALT 17 0 - 44 U/L   Alkaline Phosphatase 85 38 - 126 U/L   Total Bilirubin 0.6 0.3 - 1.2 mg/dL   GFR, Estimated >94 >70 mL/min    Comment: (NOTE) Calculated using the CKD-EPI Creatinine Equation (2021)    Anion gap 8 5 - 15    Comment: Performed at [facility], [address], [place], [place] [address]  Lipase, blood     Status: None   Collection Time: 04/27/22  7:53 AM  Result Value Ref Range   Lipase 26 11 - 51 U/L    Comment: Performed at [facility], [address], [place], [place] [address]  CBC with Differential     Status: Abnormal   Collection Time: 04/27/22  7:53 AM  Result Value Ref Range   WBC 12.6 (H) 4.0 - 10.5 K/uL   RBC 4.58 3.87 - 5.11 MIL/uL   Hemoglobin 10.6 (L) 12.0 - 15.0 g/dL   HCT 76.5 (L) 46.5 - 03.5 %   MCV 77.3 (L) 80.0 - 100.0 fL   MCH 23.1 (L) 26.0 - 34.0 pg   MCHC 29.9 (L) 30.0 - 36.0 g/dL   RDW 46.5 (H) 68.1 - 27.5 %   Platelets 501 (H) 150 - 400 K/uL   nRBC 0.0 0.0 - 0.2 %   Neutrophils Relative % 81 %   Neutro Abs 10.2 (H) 1.7 - 7.7 K/uL   Lymphocytes Relative 12 %   Lymphs Abs 1.5 0.7 - 4.0 K/uL   Monocytes Relative 5 %   Monocytes Absolute 0.6 0.1 - 1.0 K/uL   Eosinophils Relative 1 %   Eosinophils Absolute 0.1 0.0 - 0.5 K/uL   Basophils Relative 0 %   Basophils Absolute 0.0 0.0 - 0.1 K/uL   Immature Granulocytes 1 %   Abs Immature Granulocytes 0.07 0.00 - 0.07 K/uL    Comment:  Performed at [facility], [address] [place], [place] [address]  Urinalysis, Routine w reflex microscopic Urine, Clean Catch     Status: Abnormal   Collection Time: 04/27/22  7:53 AM  Result Value Ref Range   Color, Urine YELLOW YELLOW   APPearance CLOUDY (A) CLEAR   Specific Gravity, Urine 1.014 1.005 - 1.030   pH 5.0 5.0 - 8.0   Glucose, UA NEGATIVE NEGATIVE mg/dL   Hgb urine dipstick NEGATIVE NEGATIVE   Bilirubin Urine NEGATIVE NEGATIVE   Ketones, ur 5 (  A) NEGATIVE mg/dL   Protein, ur 100 (A) NEGATIVE mg/dL   Nitrite NEGATIVE NEGATIVE   Leukocytes,Ua LARGE (A) NEGATIVE   RBC / HPF 6-10 0 - 5 RBC/hpf   WBC, UA >50 (H) 0 - 5 WBC/hpf   Bacteria, UA MANY (A) NONE SEEN    Comment: Performed at [facility], [place] [address], [place], [place] [address]   CT Renal Stone Study  Result Date: 04/27/2022 CLINICAL DATA:  Left lower quadrant and back pain. EXAM: CT ABDOMEN AND PELVIS WITHOUT CONTRAST TECHNIQUE: Multidetector CT imaging of the abdomen and pelvis was performed following the standard protocol without IV contrast. RADIATION DOSE REDUCTION: This exam was performed according to the departmental dose-optimization program which includes automated exposure control, adjustment of the mA and/or kV according to patient size and/or use of iterative reconstruction technique. COMPARISON:  08/25/2021 FINDINGS: Lower chest: No focal consolidation or pleural effusion. Hepatobiliary: The liver shows diffusely decreased attenuation suggesting fat deposition. Stable 14 mm subcapsular lesion lateral segment left liver compatible with a cyst. No followup imaging is recommended. There is no evidence for gallstones, gallbladder wall thickening, or pericholecystic fluid. No intrahepatic or extrahepatic biliary dilation. Pancreas: No focal mass lesion. No dilatation of the main duct. No intraparenchymal cyst. No peripancreatic edema. Spleen: No splenomegaly. No focal mass  lesion. Adrenals/Urinary Tract: No adrenal nodule or mass. Approximately 5 nonobstructing stones are seen in the right kidney including a 4 x 8 x 7 mm stone in the right renal pelvis. No right ureteral stone. There is moderate left hydronephrosis with multiple punctate nonobstructing stones in the right kidney. The hydronephrosis is secondary to the presence of a 4 x 7 x 9 mm stone at the left UPJ. 2.1 cm water density lesion anterior interpolar left kidney is compatible with a cyst. No followup imaging is recommended. No left ureteral stone. Bladder is decompressed without evidence for stone disease. Stomach/Bowel: Stomach is unremarkable. No gastric wall thickening. No evidence of outlet obstruction. Duodenum is normally positioned as is the ligament of Treitz. No small bowel wall thickening. No small bowel dilatation. Status post right cecectomy. 4.7 x 3.9 x 3.8 cm ill-defined collection of soft tissue is identified immediately cranial to the proximal sigmoid colon. There is adjacent edema/inflammation in multiple scattered diverticuli are seen in the left colon. Left ovary may be just cranial and lateral to this collection. Vascular/Lymphatic: There is mild atherosclerotic calcification of the abdominal aorta without aneurysm. There is no gastrohepatic or hepatoduodenal ligament lymphadenopathy. No retroperitoneal or mesenteric lymphadenopathy. No pelvic sidewall lymphadenopathy. Reproductive: The uterus is unremarkable. No right adnexal mass. Left ovary is not well demonstrated discrete from the above described 4.7 cm soft tissue lesion adjacent to the sigmoid colon in the left pelvis. Other: There is some edema in the soft tissues of the pelvic floor. No substantial intraperitoneal free fluid Musculoskeletal: No worrisome lytic or sclerotic osseous abnormality. IMPRESSION: 1. 4.7 x 3.9 x 3.8 cm ill-defined collection of soft tissue immediately cranial to the proximal sigmoid colon. There is adjacent  edema/inflammation and multiple scattered diverticuli in the left colon. Assessment is limited by lack of intravenous contrast material. Imaging features raise concern for soft tissue lesion in the left pelvis. Findings may reflect sequelae of diverticulitis and pericolonic abscess. Left ovary is not definitely seen distinct from this abnormality in left ovarian lesion is a possibility. Follow-up CT with oral and intravenous contrast recommended to further evaluate. 2. Moderate left hydronephrosis secondary to the presence of a 4  x 7 x 9 mm stone at the left UPJ. 3. Bilateral nonobstructing nephrolithiasis. 4. Hepatic steatosis. 5.  Aortic Atherosclerosis (ICD10-I70.0). Electronically Signed   By: Misty Stanley M.D.   On: 04/27/2022 09:03      Assessment/Plan Abdominal pain  L UPJ stone with hydronephrosis - urology following and plan for ureteral stent today Pelvic collection on non con CT Patient seen and examined and relevant labs and imaging reviewed.  WBC elevated to 12.6 in setting of UPJ stone.  Approximately 5 x 4 x 4 cm ill-defined collection near proximal sigmoid colon on Noncon CT.  Agree with further imaging with CT IV/p.o. contrast to better assess this.  Patient's abdominal exam is reassuring, vital signs stable, afebrile -no acute surgical intervention recommended at this time.  If collection is concerning for sequelae of diverticulitis/pericolonic abscess would likely recommend antibiotics and IR drain.  Await repeat imaging.   FEN: N.p.o. for OR ID: Zosyn VTE: None, okay for chemical prophylaxis from surgical standpoint   I reviewed ED provider notes, hospitalist notes, last 24 h vitals and pain scores, last 48 h intake and output, last 24 h labs and trends, and last 24 h imaging results.   Winferd Humphrey, [facility] 04/27/2022, 2:16 PM Please see Amion for pager number during day hours 7:00am-4:30pm

## 2022-04-27 NOTE — Anesthesia Postprocedure Evaluation (Signed)
Anesthesia Post Note  Patient: Heidi Peterson  Procedure(s) Performed: CYSTOSCOPY WITH RETROGRADE PYELOGRAM/URETERAL STENT PLACEMENT (Bilateral: Urethra)     Patient location during evaluation: PACU Anesthesia Type: General Level of consciousness: sedated and patient cooperative Pain management: pain level controlled Vital Signs Assessment: post-procedure vital signs reviewed and stable Respiratory status: spontaneous breathing Cardiovascular status: stable Anesthetic complications: no   No notable events documented.  Last Vitals:  Vitals:   04/27/22 1945 04/27/22 2012  BP: 134/64 (!) 131/59  Pulse: 75 77  Resp:  16  Temp:  36.6 C  SpO2: 96% 91%    Last Pain:  Vitals:   04/27/22 2017  TempSrc:   PainSc: 0-No pain                 Lewie Loron

## 2022-04-27 NOTE — Consult Note (Signed)
H&P Physician requesting consult: Benjiman Core  Chief Complaint: Left flank pain, left ureteral calculus, right ureteropelvic junction calculus  History of Present Illness: 71 year old female presented with left-sided abdominal pain primarily in the left lower quadrant.  States it was similar to kidney stone attack so she presented to the emergency department.  She has had some chills at home.  No documented fever.  Has leukocytosis of 12.6 here but has been afebrile with vital signs stable.  Urinalysis consistent with possible UTI.  CT scan was performed that revealed a left obstructing ureteropelvic junction calculus with hydronephrosis.  She also had a right ureteropelvic junction calculus, nonobstructing.  Several nonobstructing calculi bilaterally.  She was also found to have a 5 cm ill-defined soft tissue collection in the abdomen.  General surgery following.  Recommended CT with contrast for further evaluation for possible abscess.  Likely recommendation for that would be no acute surgical intervention but possibly place IR drain and continue antibiotics.  Past Medical History:  Diagnosis Date   Kidney stones    Past Surgical History:  Procedure Laterality Date   IR RADIOLOGIST EVAL & MGMT  08/25/2021   LAPAROSCOPIC APPENDECTOMY N/A 08/09/2021   Procedure: LAPAROSCOPIC CONVERTED TO OPEN ILEOSEECTOMY;  Surgeon: Andria Meuse, MD;  Location: [facility];  Service: General;  Laterality: N/A;    Home Medications:  Medications Prior to Admission  Medication Sig Dispense Refill Last Dose   acetaminophen (TYLENOL) 500 MG tablet Take 1,000 mg by mouth every 8 (eight) hours as needed (pain).   unk   acetaminophen-codeine (TYLENOL #3) 300-30 MG tablet Take 1 tablet by mouth every 4 (four) hours as needed.   unk   BIOTIN PO Take 1 tablet by mouth every morning.   04/26/2022   Cholecalciferol (VITAMIN D-3) 125 MCG (5000 UT) TABS Take 2 tablets by mouth daily.   04/26/2022    Glucosamine-Chondroitin (OSTEO BI-FLEX REGULAR STRENGTH PO) Take 2 tablets by mouth daily.   04/26/2022   lactobacillus acidophilus (BACID) TABS tablet Take 1 tablet by mouth daily. Probiotic with apple cider vinegar   04/26/2022   lisinopril (ZESTRIL) 40 MG tablet Take 40 mg by mouth every morning.   04/26/2022   methocarbamol (ROBAXIN) 500 MG tablet Take 1 tablet (500 mg total) by mouth every 6 (six) hours as needed for muscle spasms. 60 tablet 0 unk   metoprolol succinate (TOPROL-XL) 50 MG 24 hr tablet Take 50 mg by mouth every morning. Take with or immediately following a meal.   04/26/2022 at 0900   naproxen sodium (ALEVE) 220 MG tablet Take 220 mg by mouth every 8 (eight) hours as needed (pain).   unk   Omega-3 Fatty Acids (OMEGA-3 PO) Take 1 capsule by mouth every morning.   04/26/2022   oxyCODONE (OXY IR/ROXICODONE) 5 MG immediate release tablet Take 1-2 tablets (5-10 mg total) by mouth every 6 (six) hours as needed. 20 tablet 0 unk   ASPERCREME LIDOCAINE EX Apply 1 application topically at bedtime as needed (knee pain/arthritis). (Patient not taking: Reported on 04/27/2022)   Not Taking   Multiple Vitamin (MULTIVITAMIN WITH MINERALS) TABS tablet Take 1 tablet by mouth every morning. (Patient not taking: Reported on 04/27/2022)   Not Taking   PRESCRIPTION MEDICATION Inhale into the lungs See admin instructions. CPAP - use at night and whenever resting (Patient not taking: Reported on 04/27/2022)   Not Taking   Allergies:  Allergies  Allergen Reactions   Anesthetics, Amide Other (See Comments)    All  anesthetics -- slow to metabolize   Toradol [Ketorolac Tromethamine] Other (See Comments)    hallucinations    History reviewed. No pertinent family history. Social History:  reports that she has never smoked. She has never used smokeless tobacco. She reports that she does not use drugs. No history on file for alcohol use.  ROS: A complete review of systems was performed.  All systems are  negative except for pertinent findings as noted. ROS   Physical Exam:  Vital signs in last 24 hours: Temp:  [98 F (36.7 C)-98.6 F (37 C)] 98 F (36.7 C) (11/08 1500) Pulse Rate:  [61-91] 91 (11/08 1547) Resp:  [16-22] 22 (11/08 1547) BP: (124-171)/(64-83) 153/71 (11/08 1547) SpO2:  [92 %-99 %] 92 % (11/08 1547) Weight:  [102.1 kg] 102.1 kg (11/08 0733) General:  Alert and oriented, No acute distress HEENT: Normocephalic, atraumatic Neck: No JVD or lymphadenopathy Cardiovascular: Regular rate and rhythm Lungs: Regular rate and effort Abdomen: Soft, nontender, nondistended, no abdominal masses Back: No CVA tenderness Extremities: No edema Neurologic: Grossly intact  Laboratory Data:  Results for orders placed or performed during the hospital encounter of 04/27/22 (from the past 24 hour(s))  Comprehensive metabolic panel     Status: Abnormal   Collection Time: 04/27/22  7:53 AM  Result Value Ref Range   Sodium 136 135 - 145 mmol/L   Potassium 4.2 3.5 - 5.1 mmol/L   Chloride 105 98 - 111 mmol/L   CO2 23 22 - 32 mmol/L   Glucose, Bld 195 (H) 70 - 99 mg/dL   BUN 16 8 - 23 mg/dL   Creatinine, Ser 3.23 0.44 - 1.00 mg/dL   Calcium 9.7 8.9 - 55.7 mg/dL   Total Protein 7.2 6.5 - 8.1 g/dL   Albumin 3.2 (L) 3.5 - 5.0 g/dL   AST 18 15 - 41 U/L   ALT 17 0 - 44 U/L   Alkaline Phosphatase 85 38 - 126 U/L   Total Bilirubin 0.6 0.3 - 1.2 mg/dL   GFR, Estimated >32 >20 mL/min   Anion gap 8 5 - 15  Lipase, blood     Status: None   Collection Time: 04/27/22  7:53 AM  Result Value Ref Range   Lipase 26 11 - 51 U/L  CBC with Differential     Status: Abnormal   Collection Time: 04/27/22  7:53 AM  Result Value Ref Range   WBC 12.6 (H) 4.0 - 10.5 K/uL   RBC 4.58 3.87 - 5.11 MIL/uL   Hemoglobin 10.6 (L) 12.0 - 15.0 g/dL   HCT 25.4 (L) 27.0 - 62.3 %   MCV 77.3 (L) 80.0 - 100.0 fL   MCH 23.1 (L) 26.0 - 34.0 pg   MCHC 29.9 (L) 30.0 - 36.0 g/dL   RDW 76.2 (H) 83.1 - 51.7 %   Platelets  501 (H) 150 - 400 K/uL   nRBC 0.0 0.0 - 0.2 %   Neutrophils Relative % 81 %   Neutro Abs 10.2 (H) 1.7 - 7.7 K/uL   Lymphocytes Relative 12 %   Lymphs Abs 1.5 0.7 - 4.0 K/uL   Monocytes Relative 5 %   Monocytes Absolute 0.6 0.1 - 1.0 K/uL   Eosinophils Relative 1 %   Eosinophils Absolute 0.1 0.0 - 0.5 K/uL   Basophils Relative 0 %   Basophils Absolute 0.0 0.0 - 0.1 K/uL   Immature Granulocytes 1 %   Abs Immature Granulocytes 0.07 0.00 - 0.07 K/uL  Urinalysis, Routine w reflex  microscopic Urine, Clean Catch     Status: Abnormal   Collection Time: 04/27/22  7:53 AM  Result Value Ref Range   Color, Urine YELLOW YELLOW   APPearance CLOUDY (A) CLEAR   Specific Gravity, Urine 1.014 1.005 - 1.030   pH 5.0 5.0 - 8.0   Glucose, UA NEGATIVE NEGATIVE mg/dL   Hgb urine dipstick NEGATIVE NEGATIVE   Bilirubin Urine NEGATIVE NEGATIVE   Ketones, ur 5 (A) NEGATIVE mg/dL   Protein, ur 841 (A) NEGATIVE mg/dL   Nitrite NEGATIVE NEGATIVE   Leukocytes,Ua LARGE (A) NEGATIVE   RBC / HPF 6-10 0 - 5 RBC/hpf   WBC, UA >50 (H) 0 - 5 WBC/hpf   Bacteria, UA MANY (A) NONE SEEN   No results found for this or any previous visit (from the past 240 hour(s)). Creatinine: Recent Labs    04/27/22 0753  CREATININE 0.91   CT scan personally reviewed and is detailed in history of present illness  Impression/Assessment:  Bilateral renal/ureteral calculi Left ureteral obstruction secondary to calculus Urinary tract infection  Plan:  None for cystoscopy with bilateral retrograde pyelogram, bilateral ureteral stent.  Continue antibiotics and follow-up cultures.  We will plan for ureteroscopy in 2 to 3 weeks.  Ray Church, III 04/27/2022, 4:14 PM

## 2022-04-27 NOTE — ED Triage Notes (Signed)
Pt reports LLQ pain radiating to back with nausea and urianry dribbling,  LBM; this am Gas-x without relief.  Hx of kidney stones with lithotripsy.

## 2022-04-27 NOTE — Anesthesia Preprocedure Evaluation (Addendum)
Anesthesia Evaluation  Patient identified by MRN, date of birth, ID band Patient awake    Reviewed: Allergy & Precautions, NPO status , Patient's Chart, lab work & pertinent test results, reviewed documented beta blocker date and time   Airway Mallampati: IV  TM Distance: >3 FB Neck ROM: Full    Dental  (+) Dental Advisory Given, Teeth Intact   Pulmonary neg pulmonary ROS   breath sounds clear to auscultation       Cardiovascular hypertension (lisinopril, metoprolol), Pt. on home beta blockers  Rhythm:Regular Rate:Normal     Neuro/Psych negative neurological ROS     GI/Hepatic negative GI ROS, Neg liver ROS,,,  Endo/Other  negative endocrine ROS  Morbid obesity  Renal/GU Renal disease (nephrolithiasis)stones     Musculoskeletal negative musculoskeletal ROS (+)    Abdominal  (+) + obese  Peds  Hematology  (+) Blood dyscrasia, anemia   Anesthesia Other Findings   Reproductive/Obstetrics                             Anesthesia Physical Anesthesia Plan  ASA: 3 and emergent  Anesthesia Plan: General   Post-op Pain Management: Ofirmev IV (intra-op)* and Minimal or no pain anticipated   Induction: Intravenous, Rapid sequence and Cricoid pressure planned  PONV Risk Score and Plan: 3 and Ondansetron, Dexamethasone and Treatment may vary due to age or medical condition  Airway Management Planned: Oral ETT and Video Laryngoscope Planned  Additional Equipment: None  Intra-op Plan:   Post-operative Plan: Extubation in OR  Informed Consent: I have reviewed the patients History and Physical, chart, labs and discussed the procedure including the risks, benefits and alternatives for the proposed anesthesia with the patient or authorized representative who has indicated his/her understanding and acceptance.     Dental advisory given  Plan Discussed with: CRNA  Anesthesia Plan Comments:          Anesthesia Quick Evaluation

## 2022-04-27 NOTE — Anesthesia Procedure Notes (Signed)
Procedure Name: Intubation Date/Time: 04/27/2022 5:46 PM  Performed by: Nelle Don, CRNAPre-anesthesia Checklist: Patient identified, Emergency Drugs available, Suction available and Patient being monitored Patient Re-evaluated:Patient Re-evaluated prior to induction Oxygen Delivery Method: Circle system utilized Preoxygenation: Pre-oxygenation with 100% oxygen Induction Type: IV induction and Rapid sequence Laryngoscope Size: Glidescope Grade View: Grade I Tube type: Oral Tube size: 7.0 mm Number of attempts: 1 Airway Equipment and Method: Stylet and Video-laryngoscopy Placement Confirmation: ETT inserted through vocal cords under direct vision, positive ETCO2 and breath sounds checked- equal and bilateral Secured at: 22 cm Tube secured with: Tape Dental Injury: Injury to lip  Comments: Elective glidescope used given history of use and small mouth opening. Small lip laceration, pressure applied.

## 2022-04-27 NOTE — Transfer of Care (Signed)
Immediate Anesthesia Transfer of Care Note  Patient: Heidi Peterson  Procedure(s) Performed: CYSTOSCOPY WITH RETROGRADE PYELOGRAM/URETERAL STENT PLACEMENT (Bilateral: Urethra)  Patient Location: PACU  Anesthesia Type:General  Level of Consciousness: drowsy  Airway & Oxygen Therapy: Patient Spontanous Breathing and Patient connected to face mask oxygen  Post-op Assessment: Report given to RN, Post -op Vital signs reviewed and stable, and Patient moving all extremities X 4  Post vital signs: Reviewed and stable  Last Vitals:  Vitals Value Taken Time  BP 121/46 04/27/22 1824  Temp    Pulse 88 04/27/22 1826  Resp 20 04/27/22 1826  SpO2 100 % 04/27/22 1826  Vitals shown include unvalidated device data.  Last Pain:  Vitals:   04/27/22 1628  TempSrc: Oral  PainSc: 3       Patients Stated Pain Goal: 0 (04/27/22 1628)  Complications: No notable events documented.

## 2022-04-27 NOTE — Op Note (Signed)
Operative Note  Preoperative diagnosis:  1.  Right renal calculi, left renal and ureteral calculi 2.  Left ureteral obstruction secondary to calculus 3.  Urinary tract infection  Postoperative diagnosis: 1.  Same  Procedure(s): 1.  Cystoscopy with bilateral retrograde pyelogram, bilateral ureteral stent placement  Surgeon: Modena Slater, MD  Assistants: None  Anesthesia: General  Complications: None immediate  EBL: Minimal  Specimens: 1.  None  Drains/Catheters: 1.  Bilateral 6 x 24 double-J ureteral stents  Intraoperative findings:.  Normal urethra 2.  Diffuse erythema throughout the bladder consistent with cystitis.  Some debris. 3.  Left retrograde pyelogram revealed a filling defect at the level of the left ureteropelvic junction calculus with upstream hydronephrosis. 4.  Right retrograde pyelogram revealed no hydronephrosis.  There was a filling defect consistent with stone in the kidney.  Indication: 72 year old female presented with concerns for UTI with an obstructing left ureteral stone.  Also had a right ureteropelvic junction calculus.  Therefore, presents for urgent bilateral ureteral stent placement.  Description of procedure:  The patient was identified and consent was obtained.  The patient was taken to the operating room and placed in the supine position.  The patient was placed under general anesthesia.  Perioperative antibiotics were administered.  The patient was placed in dorsal lithotomy.  Patient was prepped and draped in a standard sterile fashion and a timeout was performed.  A 21 French rigid cystoscope was advanced into the urethra and into the bladder.  Complete cystoscopy was performed with findings noted above.  An open-ended ureteral catheter was inserted into the left ureteral orifice and a retrograde pyelogram was performed with findings noted above.  A sensor wire was advanced up the left ureter and into the kidney under fluoroscopic guidance.  A  6 x 24 double-J ureteral stent was placed in a standard fashion over the wire.  Wire was withdrawn.  Fluoroscopy confirmed proximal placement and direct visualization confirmed a good coil within the bladder.  Intubated the right ureteral orifice with an open-ended ureteral catheter and a retrograde pyelogram was performed with findings noted above.  A wire was advanced up the right ureter and into the kidney under fluoroscopic guidance.  A 6 x 24 double-J ureteral stent was placed in standard fashion over the wire and the wire was withdrawn.  Fluoroscopy confirmed proximal placement and direct visualization confirmed a good coil within the bladder.  I drained the bladder and withdrew the scope.  Patient tolerated the procedure well was stable postoperative.  Plan: Continue IV antibiotics.  Follow-up on cultures.  General surgery following for the pelvic collection.  May need IR drain.  N.p.o. after midnight for that.  Diet Per general surgery given the abdominal collection.  Any diet is okay from my standpoint.  She will need to be scheduled for ureteroscopy in 2 to 3 weeks.

## 2022-04-27 NOTE — ED Notes (Signed)
Pt aware urine sample needed 

## 2022-04-27 NOTE — ED Provider Notes (Signed)
[facility]-EMERGENCY DEPT Provider Note   CSN: [id number] Arrival date & time: 04/27/22  8466     History  Chief Complaint  Patient presents with   Abdominal Pain    Heidi Peterson is a 71 y.o. female.   Abdominal Pain Patient presents abdominal pain/flank pain.  Has had for around 3 days.  Previous history of kidney stones and says this feels somewhat the same.  No fever but states she has had chills.  Reviewing records appears in February she had a complicated appendicitis that developed pelvic abscess that required IR drainage.  Pain is in mostly the left lower abdomen but states he does feel like a stone.  Patient states she has been full before however.    Past Medical History:  Diagnosis Date   Kidney stones     Home Medications Prior to Admission medications   Medication Sig Start Date End Date Taking? Authorizing Provider  acetaminophen (TYLENOL) 500 MG tablet Take 500 mg by mouth every 8 (eight) hours as needed (pain).    [provider]  ASPERCREME LIDOCAINE EX Apply 1 application topically at bedtime as needed (knee pain/arthritis).    [provider]  BIOTIN PO Take 1 tablet by mouth every morning.    [provider]  Glucosamine-Chondroitin (OSTEO BI-FLEX REGULAR STRENGTH PO) Take 1 tablet by mouth at bedtime.    [provider]  lisinopril (ZESTRIL) 40 MG tablet Take 40 mg by mouth every morning.    [provider]  methocarbamol (ROBAXIN) 500 MG tablet Take 1 tablet (500 mg total) by mouth every 6 (six) hours as needed for muscle spasms. 08/19/21   Barnetta Chapel, PA-C  metoprolol succinate (TOPROL-XL) 50 MG 24 hr tablet Take 50 mg by mouth every morning. Take with or immediately following a meal.    [provider]  Multiple Vitamin (MULTIVITAMIN WITH MINERALS) TABS tablet Take 1 tablet by mouth every morning.    [provider]  naproxen sodium (ALEVE) 220 MG tablet Take  220 mg by mouth every 8 (eight) hours as needed (pain).    [provider]  Omega-3 Fatty Acids (OMEGA-3 PO) Take 1 capsule by mouth every morning.    [provider]  oxyCODONE (OXY IR/ROXICODONE) 5 MG immediate release tablet Take 1-2 tablets (5-10 mg total) by mouth every 6 (six) hours as needed. 08/19/21   Barnetta Chapel, PA-C  polyvinyl alcohol (ARTIFICIAL TEARS) 1.4 % ophthalmic solution Place 1 drop into both eyes at bedtime as needed for dry eyes.    [provider]  PRESCRIPTION MEDICATION Inhale into the lungs See admin instructions. CPAP - use at night and whenever resting    [provider]      Allergies    Patient has no known allergies.    Review of Systems   Review of Systems  Gastrointestinal:  Positive for abdominal pain.    Physical Exam Updated Vital Signs BP (!) 157/83 (BP Location: Left Arm)   Pulse 85   Temp 98.6 F (37 C) (Oral)   Resp 17   Ht 5' 2.5" (1.588 m)   Wt 102.1 kg   SpO2 95%   BMI 40.50 kg/m  Physical Exam Vitals and nursing note reviewed.  Constitutional:      Appearance: She is obese.  Cardiovascular:     Rate and Rhythm: Normal rate.  Pulmonary:     Breath sounds: Normal breath sounds.  Abdominal:     Tenderness: There is  abdominal tenderness.     Comments: Left lower quadrant tenderness out rebound or guarding.  No hernias palpated.  No CVA tenderness.  Skin:    General: Skin is warm.     Capillary Refill: Capillary refill takes less than 2 seconds.  Neurological:     Mental Status: She is alert and oriented to person, place, and time.     ED Results / Procedures / Treatments   Labs (all labs ordered are listed, but only abnormal results are displayed) Labs Reviewed  COMPREHENSIVE METABOLIC PANEL - Abnormal; Notable for the following components:      Result Value   Glucose, Bld 195 (*)    Albumin 3.2 (*)    All other components within normal limits  CBC WITH DIFFERENTIAL/PLATELET -  Abnormal; Notable for the following components:   WBC 12.6 (*)    Hemoglobin 10.6 (*)    HCT 35.4 (*)    MCV 77.3 (*)    MCH 23.1 (*)    MCHC 29.9 (*)    RDW 16.5 (*)    Platelets 501 (*)    Neutro Abs 10.2 (*)    All other components within normal limits  URINALYSIS, ROUTINE W REFLEX MICROSCOPIC - Abnormal; Notable for the following components:   APPearance CLOUDY (*)    Ketones, ur 5 (*)    Protein, ur 100 (*)    Leukocytes,Ua LARGE (*)    WBC, UA >50 (*)    Bacteria, UA MANY (*)    All other components within normal limits  URINE CULTURE  LIPASE, BLOOD    EKG None  Radiology CT Renal Stone Study  Result Date: 04/27/2022 CLINICAL DATA:  Left lower quadrant and back pain. EXAM: CT ABDOMEN AND PELVIS WITHOUT CONTRAST TECHNIQUE: Multidetector CT imaging of the abdomen and pelvis was performed following the standard protocol without IV contrast. RADIATION DOSE REDUCTION: This exam was performed according to the departmental dose-optimization program which includes automated exposure control, adjustment of the mA and/or kV according to patient size and/or use of iterative reconstruction technique. COMPARISON:  08/25/2021 FINDINGS: Lower chest: No focal consolidation or pleural effusion. Hepatobiliary: The liver shows diffusely decreased attenuation suggesting fat deposition. Stable 14 mm subcapsular lesion lateral segment left liver compatible with a cyst. No followup imaging is recommended. There is no evidence for gallstones, gallbladder wall thickening, or pericholecystic fluid. No intrahepatic or extrahepatic biliary dilation. Pancreas: No focal mass lesion. No dilatation of the main duct. No intraparenchymal cyst. No peripancreatic edema. Spleen: No splenomegaly. No focal mass lesion. Adrenals/Urinary Tract: No adrenal nodule or mass. Approximately 5 nonobstructing stones are seen in the right kidney including a 4 x 8 x 7 mm stone in the right renal pelvis. No right ureteral stone.  There is moderate left hydronephrosis with multiple punctate nonobstructing stones in the right kidney. The hydronephrosis is secondary to the presence of a 4 x 7 x 9 mm stone at the left UPJ. 2.1 cm water density lesion anterior interpolar left kidney is compatible with a cyst. No followup imaging is recommended. No left ureteral stone. Bladder is decompressed without evidence for stone disease. Stomach/Bowel: Stomach is unremarkable. No gastric wall thickening. No evidence of outlet obstruction. Duodenum is normally positioned as is the ligament of Treitz. No small bowel wall thickening. No small bowel dilatation. Status post right cecectomy. 4.7 x 3.9 x 3.8 cm ill-defined collection of soft tissue is identified immediately cranial to the proximal sigmoid colon. There is adjacent edema/inflammation in multiple scattered diverticuli  are seen in the left colon. Left ovary may be just cranial and lateral to this collection. Vascular/Lymphatic: There is mild atherosclerotic calcification of the abdominal aorta without aneurysm. There is no gastrohepatic or hepatoduodenal ligament lymphadenopathy. No retroperitoneal or mesenteric lymphadenopathy. No pelvic sidewall lymphadenopathy. Reproductive: The uterus is unremarkable. No right adnexal mass. Left ovary is not well demonstrated discrete from the above described 4.7 cm soft tissue lesion adjacent to the sigmoid colon in the left pelvis. Other: There is some edema in the soft tissues of the pelvic floor. No substantial intraperitoneal free fluid Musculoskeletal: No worrisome lytic or sclerotic osseous abnormality. IMPRESSION: 1. 4.7 x 3.9 x 3.8 cm ill-defined collection of soft tissue immediately cranial to the proximal sigmoid colon. There is adjacent edema/inflammation and multiple scattered diverticuli in the left colon. Assessment is limited by lack of intravenous contrast material. Imaging features raise concern for soft tissue lesion in the left pelvis. Findings  may reflect sequelae of diverticulitis and pericolonic abscess. Left ovary is not definitely seen distinct from this abnormality in left ovarian lesion is a possibility. Follow-up CT with oral and intravenous contrast recommended to further evaluate. 2. Moderate left hydronephrosis secondary to the presence of a 4 x 7 x 9 mm stone at the left UPJ. 3. Bilateral nonobstructing nephrolithiasis. 4. Hepatic steatosis. 5.  Aortic Atherosclerosis (ICD10-I70.0). Electronically Signed   By: Kennith Center M.D.   On: 04/27/2022 09:03    Procedures Procedures    Medications Ordered in ED Medications  morphine (PF) 4 MG/ML injection 4 mg (has no administration in time range)  iohexol (OMNIPAQUE) 9 MG/ML oral solution 500 mL (has no administration in time range)  iohexol (OMNIPAQUE) 9 MG/ML oral solution (has no administration in time range)  morphine (PF) 4 MG/ML injection 4 mg (4 mg Intravenous Given 04/27/22 1222)  ondansetron (ZOFRAN) injection 4 mg (4 mg Intravenous Given 04/27/22 1220)  piperacillin-tazobactam (ZOSYN) IVPB 3.375 g (0 g Intravenous Stopped 04/27/22 1309)    ED Course/ Medical Decision Making/ A&P                           Medical Decision Making Amount and/or Complexity of Data Reviewed Radiology: ordered.  Risk Prescription drug management.   Patient with abdominal/flank pain.  History of kidney stones but also previous abdominal infections.  Has white count is mildly elevated.  Urine shows infection.  Reviewed notes from admission to hospital 9 months ago for appendicitis/abscess.  CT scan done and independently interpreted does show kidney stone on the left that is large and high, however does have also potential intra-abdominal abscess in the sigmoid area.  Radiologist requested CT scan with IV and oral contrast.  We will get this.  Discussed with Dr. Alvester Morin from urology.  With potentially infected obstructed stone and potentially need stenting.  Antibiotics have been given with  Zosyn to cover intra-abdominal infection and potential UTI.  However will wait on CT scan to further evaluate the potential abscess.  Dr. Alvester Morin is seen patient.  Plans to take to the OR for stents.  He has discussed with general surgery and they do not need any anesthesia at this time even if it is the abscess.  Dr. Alvester Morin is requested that I discussed with internal medicine for admission prior to the OR.        Final Clinical Impression(s) / ED Diagnoses Final diagnoses:  Abdominal pain, unspecified abdominal location  Urinary tract infection without hematuria, site  unspecified  Kidney stone    Rx / DC Orders ED Discharge Orders     None         Benjiman Core, MD 04/27/22 1342

## 2022-04-27 NOTE — Progress Notes (Signed)
Pharmacy Antibiotic Note  Heidi Peterson is a 71 y.o. female with hx kidney stones who presented to the ED on 04/27/2022 with c/o nausea, flank and abdominal pain. CT renal study showed findings with concern for sequelae of diverticulitis and pericolonic abscess, left kidney stone and bilateral nonobstructing nephrolithiasis. Pharmacy has been consulted to dose zosyn for intra-abdominal infection.   Plan: - zosyn 3.375 gm IV q8h (infuse over 4 hrs) - With good renal function, pharmacy will sign off for abx consult.  Reconsult [place] if need further assistance.  ________________________________  Height: 5' 2.5" (158.8 cm) Weight: 102.1 kg (225 lb) IBW/kg (Calculated) : 51.25  Temp (24hrs), Avg:98.4 F (36.9 C), Min:98.2 F (36.8 C), Max:98.6 F (37 C)  Recent Labs  Lab 04/27/22 0753  WBC 12.6*  CREATININE 0.91    Estimated Creatinine Clearance: 65 mL/min (by C-G formula based on SCr of 0.91 mg/dL).    Allergies  Allergen Reactions   Anesthetics, Amide Other (See Comments)    All anesthetics -- slow to metabolize   Toradol [Ketorolac Tromethamine] Other (See Comments)    hallucinations     Thank you for allowing pharmacy to be a part of this patient's care.  Lucia Gaskins 04/27/2022 2:48 PM

## 2022-04-28 ENCOUNTER — Encounter (HOSPITAL_COMMUNITY): Payer: Self-pay | Admitting: Urology

## 2022-04-28 ENCOUNTER — Inpatient Hospital Stay (HOSPITAL_COMMUNITY): Payer: Medicare Other

## 2022-04-28 DIAGNOSIS — N1330 Unspecified hydronephrosis: Secondary | ICD-10-CM

## 2022-04-28 DIAGNOSIS — K5720 Diverticulitis of large intestine with perforation and abscess without bleeding: Secondary | ICD-10-CM | POA: Diagnosis not present

## 2022-04-28 DIAGNOSIS — I10 Essential (primary) hypertension: Secondary | ICD-10-CM

## 2022-04-28 DIAGNOSIS — N390 Urinary tract infection, site not specified: Secondary | ICD-10-CM

## 2022-04-28 DIAGNOSIS — N139 Obstructive and reflux uropathy, unspecified: Secondary | ICD-10-CM | POA: Diagnosis not present

## 2022-04-28 DIAGNOSIS — R739 Hyperglycemia, unspecified: Secondary | ICD-10-CM

## 2022-04-28 DIAGNOSIS — D509 Iron deficiency anemia, unspecified: Secondary | ICD-10-CM

## 2022-04-28 LAB — CBC
HCT: 33.3 % — ABNORMAL LOW (ref 36.0–46.0)
Hemoglobin: 10.3 g/dL — ABNORMAL LOW (ref 12.0–15.0)
MCH: 23.3 pg — ABNORMAL LOW (ref 26.0–34.0)
MCHC: 30.9 g/dL (ref 30.0–36.0)
MCV: 75.3 fL — ABNORMAL LOW (ref 80.0–100.0)
Platelets: 484 10*3/uL — ABNORMAL HIGH (ref 150–400)
RBC: 4.42 MIL/uL (ref 3.87–5.11)
RDW: 16.5 % — ABNORMAL HIGH (ref 11.5–15.5)
WBC: 16.9 10*3/uL — ABNORMAL HIGH (ref 4.0–10.5)
nRBC: 0 % (ref 0.0–0.2)

## 2022-04-28 LAB — COMPREHENSIVE METABOLIC PANEL
ALT: 18 U/L (ref 0–44)
AST: 17 U/L (ref 15–41)
Albumin: 3 g/dL — ABNORMAL LOW (ref 3.5–5.0)
Alkaline Phosphatase: 77 U/L (ref 38–126)
Anion gap: 9 (ref 5–15)
BUN: 19 mg/dL (ref 8–23)
CO2: 22 mmol/L (ref 22–32)
Calcium: 9.4 mg/dL (ref 8.9–10.3)
Chloride: 106 mmol/L (ref 98–111)
Creatinine, Ser: 0.96 mg/dL (ref 0.44–1.00)
GFR, Estimated: >60 mL/min (ref >60)
Glucose, Bld: 203 mg/dL — ABNORMAL HIGH (ref 70–99)
Potassium: 4.2 mmol/L (ref 3.5–5.1)
Sodium: 137 mmol/L (ref 135–145)
Total Bilirubin: 0.6 mg/dL (ref 0.3–1.2)
Total Protein: 7.3 g/dL (ref 6.5–8.1)

## 2022-04-28 LAB — FERRITIN: Ferritin: 213 ng/mL (ref 11–307)

## 2022-04-28 LAB — PROTIME-INR
INR: 1.2 (ref 0.8–1.2)
Prothrombin Time: 15.1 seconds (ref 11.4–15.2)

## 2022-04-28 LAB — HEMOGLOBIN A1C
Hgb A1c MFr Bld: 6.5 % — ABNORMAL HIGH (ref 4.8–5.6)
Mean Plasma Glucose: 139.85 mg/dL

## 2022-04-28 MED ORDER — METOPROLOL SUCCINATE ER 50 MG PO TB24
50.0000 mg | ORAL_TABLET | Freq: Every morning | ORAL | Status: DC
  Administered 2022-04-29 – 2022-05-05 (×7): 50 mg via ORAL
  Filled 2022-04-28 (×7): qty 1

## 2022-04-28 MED ORDER — OXYCODONE HCL 5 MG PO TABS
5.0000 mg | ORAL_TABLET | Freq: Once | ORAL | Status: AC
  Administered 2022-04-28: 5 mg via ORAL
  Filled 2022-04-28: qty 1

## 2022-04-28 MED ORDER — MIDAZOLAM HCL 2 MG/2ML IJ SOLN
INTRAMUSCULAR | Status: AC | PRN
  Administered 2022-04-28: 1 mg via INTRAVENOUS

## 2022-04-28 MED ORDER — FENTANYL CITRATE (PF) 100 MCG/2ML IJ SOLN
INTRAMUSCULAR | Status: AC | PRN
  Administered 2022-04-28: 50 ug via INTRAVENOUS

## 2022-04-28 MED ORDER — FENTANYL CITRATE (PF) 100 MCG/2ML IJ SOLN
INTRAMUSCULAR | Status: AC
  Filled 2022-04-28: qty 4

## 2022-04-28 MED ORDER — MIDAZOLAM HCL 2 MG/2ML IJ SOLN
INTRAMUSCULAR | Status: AC
  Filled 2022-04-28: qty 4

## 2022-04-28 NOTE — Assessment & Plan Note (Signed)
Chronic. Stable. Iron panel with possible iron deficiency, although ferritin is normal. -Iron supplementation on discharge

## 2022-04-28 NOTE — Progress Notes (Signed)
Referring Physician(s): Loran Senters, PA-C  Supervising Physician: Michaelle Birks  Patient Status:  [facility] - In-pt  Chief Complaint: Abd/pelvic abscess   Subjective: Patient known to IR service from drainage of left pelvic abscess on 08/17/2021 following acute perforated appendicitis and lap converted to open ileocecectomy.  Drain was removed on 08/25/2021 following negative fistula study. She was admitted to [facility] on 11/8 with persistent abdominal/flank pain, nausea, frequent urination/cloudy urine.  CT renal study without contrast revealed:  1. 4.7 x 3.9 x 3.8 cm ill-defined collection of soft tissue immediately cranial to the proximal sigmoid colon. There is adjacent edema/inflammation and multiple scattered diverticuli in the left colon. Assessment is limited by lack of intravenous contrast material. Imaging features raise concern for soft tissue lesion in the left pelvis. Findings may reflect sequelae of diverticulitis and pericolonic abscess. Left ovary is not definitely seen distinct from this abnormality in left ovarian lesion is a possibility. Follow-up CT with oral and intravenous contrast recommended to further evaluate. 2. Moderate left hydronephrosis secondary to the presence of a 4 x 7 x 9 mm stone at the left UPJ. 3. Bilateral nonobstructing nephrolithiasis. 4. Hepatic steatosis. 5.  Aortic Atherosclerosis  CT abdomen pelvis with contrast revealed:  Diverticulosis of colon. There is focal wall thickening along with pericolic stranding in sigmoid colon suggesting acute diverticulitis. There are 2 loculated fluid collections each measuring less than 4 cm in size immediately adjacent to the sigmoid colon suggesting possible pericolic abscesses. There is a linear 8.6 x 2.1 cm loculated fluid collection in left side of pelvis medial to the iliac vessels with interval increase suggesting loculated serous fluid collection or another developing abscess related  to acute diverticulitis. Small amount of free fluid is seen in pelvis.   There is 7 mm calculus at the left ureteropelvic junction causing moderate left hydronephrosis. Bilateral renal stones.   There is no evidence of intestinal obstruction or pneumoperitoneum. Fatty liver. Linear densities in left lower lung fields suggest subsegmental atelectasis.  Patient underwent cystoscopy with bilateral retrograde pyelogram and bilateral ureteral stent placements on 11/8 with Dr. Gloriann Loan; is currently afebrile, WBC 16.9 up from 12.6, hemoglobin 10.3, platelets 484k, creatinine normal, PT/INR normal; urine culture with gram-negative rods, blood culture pending.  Patient on IV Zosyn.  Request now received from surgical team for drainage of abdominal/pelvic abscess.  Patient denies fever, headache, chest pain, dyspnea, cough, back pain, nausea, vomiting or bleeding.  She does have some abdominal "gas"discomfort.  Past Medical History:  Diagnosis Date   Kidney stones    Past Surgical History:  Procedure Laterality Date   CYSTOSCOPY W/ URETERAL STENT PLACEMENT Bilateral 04/27/2022   Procedure: CYSTOSCOPY WITH RETROGRADE PYELOGRAM/URETERAL STENT PLACEMENT;  Surgeon: Lucas Mallow, MD;  Location: [facility]S;  Service: Urology;  Laterality: Bilateral;   IR RADIOLOGIST EVAL & MGMT  08/25/2021   LAPAROSCOPIC APPENDECTOMY N/A 08/09/2021   Procedure: LAPAROSCOPIC CONVERTED TO OPEN ILEOSEECTOMY;  Surgeon: Ileana Roup, MD;  Location: [facility];  Service: General;  Laterality: N/A;     Allergies: Toradol [ketorolac tromethamine]  Medications: Prior to Admission medications   Medication Sig Start Date End Date Taking? Authorizing Provider  acetaminophen (TYLENOL) 500 MG tablet Take 1,000 mg by mouth every 8 (eight) hours as needed (pain).   Yes [provider]  acetaminophen-codeine (TYLENOL #3) 300-30 MG tablet Take 1 tablet by mouth every 4 (four) hours as needed. 01/21/22  Yes [provider]  BIOTIN PO Take 1 tablet  by mouth every morning.   Yes [provider]  Cholecalciferol (VITAMIN D-3) 125 MCG (5000 UT) TABS Take 2 tablets by mouth daily.   Yes [provider]  Glucosamine-Chondroitin (OSTEO BI-FLEX REGULAR STRENGTH PO) Take 2 tablets by mouth daily.   Yes [provider]  lactobacillus acidophilus (BACID) TABS tablet Take 1 tablet by mouth daily. Probiotic with apple cider vinegar   Yes [provider]  lisinopril (ZESTRIL) 40 MG tablet Take 40 mg by mouth every morning.   Yes [provider]  methocarbamol (ROBAXIN) 500 MG tablet Take 1 tablet (500 mg total) by mouth every 6 (six) hours as needed for muscle spasms. 08/19/21  Yes Saverio Danker, PA-C  metoprolol succinate (TOPROL-XL) 50 MG 24 hr tablet Take 50 mg by mouth every morning. Take with or immediately following a meal.   Yes [provider]  naproxen sodium (ALEVE) 220 MG tablet Take 220 mg by mouth every 8 (eight) hours as needed (pain).   Yes [provider]  Omega-3 Fatty Acids (OMEGA-3 PO) Take 1 capsule by mouth every morning.   Yes [provider]  oxyCODONE (OXY IR/ROXICODONE) 5 MG immediate release tablet Take 1-2 tablets (5-10 mg total) by mouth every 6 (six) hours as needed. 08/19/21  Yes Saverio Danker, PA-C  ASPERCREME LIDOCAINE EX Apply 1 application topically at bedtime as needed (knee pain/arthritis). Patient not taking: Reported on 04/27/2022    [provider]  Multiple Vitamin (MULTIVITAMIN WITH MINERALS) TABS tablet Take 1 tablet by mouth every morning. Patient not taking: Reported on 04/27/2022    [provider]  PRESCRIPTION MEDICATION Inhale into the lungs See admin instructions. CPAP - use at night and whenever resting Patient not taking: Reported on 04/27/2022    [provider]     Vital Signs: BP 132/68 (BP Location: Left Arm)   Pulse 71   Temp 97.7 F (36.5 C) (Oral)   Resp 18    Ht 5' 2.5" (1.588 m)   Wt 225 lb (102.1 kg)   SpO2 95%   BMI 40.50 kg/m   Physical Exam awake, alert.  Chest clear to auscultation bilaterally.  Heart with regular rate and rhythm.  Abdomen soft, positive bowel sounds, minimal lower abdominal tenderness to palpation.  No lower extremity edema.  Imaging: DG C-Arm 1-60 Min-No Report  Result Date: 04/27/2022 Fluoroscopy was utilized by the requesting physician.  No radiographic interpretation.   CT ABDOMEN PELVIS W CONTRAST  Result Date: 04/27/2022 CLINICAL DATA:  Left lower quadrant abdominal pain EXAM: CT ABDOMEN AND PELVIS WITH CONTRAST TECHNIQUE: Multidetector CT imaging of the abdomen and pelvis was performed using the standard protocol following bolus administration of intravenous contrast. RADIATION DOSE REDUCTION: This exam was performed according to the departmental dose-optimization program which includes automated exposure control, adjustment of the mA and/or kV according to patient size and/or use of iterative reconstruction technique. CONTRAST:  159mL OMNIPAQUE IOHEXOL 300 MG/ML  SOLN COMPARISON:  Study done earlier today FINDINGS: Lower chest: There are small linear densities in the lower lung fields, more so on the left side suggesting subsegmental atelectasis. Scattered coronary artery calcifications are seen. Hepatobiliary: There is fatty infiltration in liver. There is 1.7 cm cyst in the left lobe. There is no dilation of bile ducts. Gallbladder is unremarkable. Pancreas: No focal abnormalities are seen. Spleen: Spleen is not enlarged. There is subcentimeter low-density, possibly a cyst or hemangioma. Adrenals/Urinary Tract: Adrenals are unremarkable. There is 7 mm calculus at the left ureteropelvic  junction causing moderate left hydronephrosis. There are few bilateral renal stones, more so in the right kidney. There are 2 stones in the right renal pelvis measuring up to 8 mm. There is no hydronephrosis in the right kidney. Urinary  bladder is not distended. There are few left renal cysts measuring up to 2.4 cm. Stomach/Bowel: Stomach is unremarkable. Small bowel loops are not dilated. Appendix is not seen. Diverticula seen in colon. There is wall thickening and pericolic stranding in sigmoid colon. There is 4 x 2.2 cm loculated fluid collection in the upper margin of sigmoid colon. There is another 2 x 1.2 cm fluid collection in the inferior margin of sigmoid. There is a linear loculated fluid collection in left side of pelvis measuring 8.6 x 2.1 cm with interval increase in size. Small amount of free fluid is seen in adjacent to the rectosigmoid with interval increase. Vascular/Lymphatic: Scattered arterial calcifications are seen. Reproductive: Unremarkable. Other: There is no ascites or pneumoperitoneum. There is a stranding in subcutaneous plane in the suprapubic region, possibly residual from previous surgery. Small umbilical hernia containing fat is seen. Musculoskeletal: No acute findings are seen. IMPRESSION: Diverticulosis of colon. There is focal wall thickening along with pericolic stranding in sigmoid colon suggesting acute diverticulitis. There are 2 loculated fluid collections each measuring less than 4 cm in size immediately adjacent to the sigmoid colon suggesting possible pericolic abscesses. There is a linear 8.6 x 2.1 cm loculated fluid collection in left side of pelvis medial to the iliac vessels with interval increase suggesting loculated serous fluid collection or another developing abscess related to acute diverticulitis. Small amount of free fluid is seen in pelvis. There is 7 mm calculus at the left ureteropelvic junction causing moderate left hydronephrosis. Bilateral renal stones. There is no evidence of intestinal obstruction or pneumoperitoneum. Fatty liver. Linear densities in left lower lung fields suggest subsegmental atelectasis. Other findings as described in the body of the report. Electronically Signed   By:  Elmer Picker M.D.   On: 04/27/2022 16:04   CT Renal Stone Study  Result Date: 04/27/2022 CLINICAL DATA:  Left lower quadrant and back pain. EXAM: CT ABDOMEN AND PELVIS WITHOUT CONTRAST TECHNIQUE: Multidetector CT imaging of the abdomen and pelvis was performed following the standard protocol without IV contrast. RADIATION DOSE REDUCTION: This exam was performed according to the departmental dose-optimization program which includes automated exposure control, adjustment of the mA and/or kV according to patient size and/or use of iterative reconstruction technique. COMPARISON:  08/25/2021 FINDINGS: Lower chest: No focal consolidation or pleural effusion. Hepatobiliary: The liver shows diffusely decreased attenuation suggesting fat deposition. Stable 14 mm subcapsular lesion lateral segment left liver compatible with a cyst. No followup imaging is recommended. There is no evidence for gallstones, gallbladder wall thickening, or pericholecystic fluid. No intrahepatic or extrahepatic biliary dilation. Pancreas: No focal mass lesion. No dilatation of the main duct. No intraparenchymal cyst. No peripancreatic edema. Spleen: No splenomegaly. No focal mass lesion. Adrenals/Urinary Tract: No adrenal nodule or mass. Approximately 5 nonobstructing stones are seen in the right kidney including a 4 x 8 x 7 mm stone in the right renal pelvis. No right ureteral stone. There is moderate left hydronephrosis with multiple punctate nonobstructing stones in the right kidney. The hydronephrosis is secondary to the presence of a 4 x 7 x 9 mm stone at the left UPJ. 2.1 cm water density lesion anterior interpolar left kidney is compatible with a cyst. No followup imaging is recommended. No left ureteral stone.  Bladder is decompressed without evidence for stone disease. Stomach/Bowel: Stomach is unremarkable. No gastric wall thickening. No evidence of outlet obstruction. Duodenum is normally positioned as is the ligament of  Treitz. No small bowel wall thickening. No small bowel dilatation. Status post right cecectomy. 4.7 x 3.9 x 3.8 cm ill-defined collection of soft tissue is identified immediately cranial to the proximal sigmoid colon. There is adjacent edema/inflammation in multiple scattered diverticuli are seen in the left colon. Left ovary may be just cranial and lateral to this collection. Vascular/Lymphatic: There is mild atherosclerotic calcification of the abdominal aorta without aneurysm. There is no gastrohepatic or hepatoduodenal ligament lymphadenopathy. No retroperitoneal or mesenteric lymphadenopathy. No pelvic sidewall lymphadenopathy. Reproductive: The uterus is unremarkable. No right adnexal mass. Left ovary is not well demonstrated discrete from the above described 4.7 cm soft tissue lesion adjacent to the sigmoid colon in the left pelvis. Other: There is some edema in the soft tissues of the pelvic floor. No substantial intraperitoneal free fluid Musculoskeletal: No worrisome lytic or sclerotic osseous abnormality. IMPRESSION: 1. 4.7 x 3.9 x 3.8 cm ill-defined collection of soft tissue immediately cranial to the proximal sigmoid colon. There is adjacent edema/inflammation and multiple scattered diverticuli in the left colon. Assessment is limited by lack of intravenous contrast material. Imaging features raise concern for soft tissue lesion in the left pelvis. Findings may reflect sequelae of diverticulitis and pericolonic abscess. Left ovary is not definitely seen distinct from this abnormality in left ovarian lesion is a possibility. Follow-up CT with oral and intravenous contrast recommended to further evaluate. 2. Moderate left hydronephrosis secondary to the presence of a 4 x 7 x 9 mm stone at the left UPJ. 3. Bilateral nonobstructing nephrolithiasis. 4. Hepatic steatosis. 5.  Aortic Atherosclerosis (ICD10-I70.0). Electronically Signed   By: Misty Stanley M.D.   On: 04/27/2022 09:03    Labs:  CBC: Recent  Labs    08/17/21 0309 08/19/21 0301 04/27/22 0753 04/28/22 0431  WBC 14.5* 16.5* 12.6* 16.9*  HGB 9.5* 9.4* 10.6* 10.3*  HCT 31.6* 31.4* 35.4* 33.3*  PLT 430* 494* 501* 484*    COAGS: Recent Labs    08/17/21 1010 04/28/22 0431  INR 1.0 1.2    BMP: Recent Labs    08/13/21 0836 08/17/21 0309 04/27/22 0753 04/28/22 0431  NA 137 138 136 137  K 3.7 3.2* 4.2 4.2  CL 106 105 105 106  CO2 24 27 23 22   GLUCOSE 94 153* 195* 203*  BUN 24* 14 16 19   CALCIUM 8.9 9.3 9.7 9.4  CREATININE 0.66 0.69 0.91 0.96  GFRNONAA >60 >60 >60 >60    LIVER FUNCTION TESTS: Recent Labs    08/09/21 0820 04/27/22 0753 04/28/22 0431  BILITOT 0.6 0.6 0.6  AST 16 18 17   ALT 14 17 18   ALKPHOS 87 85 77  PROT 6.9 7.2 7.3  ALBUMIN 3.6 3.2* 3.0*    Assessment and Plan: Patient known to IR service from drainage of left pelvic abscess on 08/17/2021 following acute perforated appendicitis and lap converted to open ileocecectomy.  Drain was removed on 08/25/2021 following negative fistula study. She was admitted to [facility] on 11/8 with persistent abdominal/flank pain, nausea, frequent urination/cloudy urine.  CT renal study without contrast revealed:  1. 4.7 x 3.9 x 3.8 cm ill-defined collection of soft tissue immediately cranial to the proximal sigmoid colon. There is adjacent edema/inflammation and multiple scattered diverticuli in the left colon. Assessment is limited by lack of intravenous contrast material. Imaging  features raise concern for soft tissue lesion in the left pelvis. Findings may reflect sequelae of diverticulitis and pericolonic abscess. Left ovary is not definitely seen distinct from this abnormality in left ovarian lesion is a possibility. Follow-up CT with oral and intravenous contrast recommended to further evaluate. 2. Moderate left hydronephrosis secondary to the presence of a 4 x 7 x 9 mm stone at the left UPJ. 3. Bilateral nonobstructing nephrolithiasis. 4.  Hepatic steatosis. 5.  Aortic Atherosclerosis  CT abdomen pelvis with contrast revealed:  Diverticulosis of colon. There is focal wall thickening along with pericolic stranding in sigmoid colon suggesting acute diverticulitis. There are 2 loculated fluid collections each measuring less than 4 cm in size immediately adjacent to the sigmoid colon suggesting possible pericolic abscesses. There is a linear 8.6 x 2.1 cm loculated fluid collection in left side of pelvis medial to the iliac vessels with interval increase suggesting loculated serous fluid collection or another developing abscess related to acute diverticulitis. Small amount of free fluid is seen in pelvis.   There is 7 mm calculus at the left ureteropelvic junction causing moderate left hydronephrosis. Bilateral renal stones.   There is no evidence of intestinal obstruction or pneumoperitoneum. Fatty liver. Linear densities in left lower lung fields suggest subsegmental atelectasis.  Patient underwent cystoscopy with bilateral retrograde pyelogram and bilateral ureteral stent placements on 11/8 with Dr. Alvester Morin; is currently afebrile, WBC 16.9 up from 12.6, hemoglobin 10.3, platelets 484k, creatinine normal, PT/INR normal; urine culture with gram-negative rods, blood culture pending.  Patient on IV Zosyn.  Request now received from surgical team for drainage of abdominal/pelvic abscess.  Imaging studies have been reviewed by Dr. Milford Cage. Risks and benefits discussed with the patient including bleeding, infection, damage to adjacent structures, bowel perforation/fistula connection, and sepsis.  All of the patient's questions were answered, patient is agreeable to proceed. Consent signed and in chart.  Procedure tent scheduled for today.   Electronically Signed: D. Jeananne Rama, PA-C 04/28/2022, 9:54 AM   I spent a total of 25 Minutes at the the patient's bedside AND on the patient's hospital floor or unit, greater than 50% of  which was counseling/coordinating care for CT-guided abdominal/pelvic abscess drainage    Patient ID: Heidi Peterson, female   DOB: [birth year], 71 y.o.   MRN: [id number]

## 2022-04-28 NOTE — Assessment & Plan Note (Signed)
Hemoglobin A1C of 6.5%. Discussed with patient and she prefers lifestyle changes. Will allow follow-up with primary care physician for repeat hemoglobin A1C to confirm diagnosis of diabetes at that time.

## 2022-04-28 NOTE — Progress Notes (Signed)
Urology Inpatient Progress Report  Kidney stone [N20.0] Obstructive uropathy [N13.9] Abdominal pain, unspecified abdominal location [R10.9] Urinary tract infection without hematuria, site unspecified [N39.0]  Procedure(s): CYSTOSCOPY WITH RETROGRADE PYELOGRAM/URETERAL STENT PLACEMENT  1 Day Post-Op   Intv/Subj: No acute events overnight. Patient is without complaint except for some irritation from the stent which is to be expected.  Leukocytosis worsened today.  Remains on Zosyn.  Urine culture with E. coli, sensitivities pending.  Principal Problem:   Obstructive uropathy Active Problems:   Complicated UTI (urinary tract infection)   Diverticulitis of intestine with abscess   Microcytic anemia   Hyperglycemia   HTN (hypertension)   Obesity, Class III, BMI 40-49.9 (morbid obesity) (HCC)   Hydronephrosis of left kidney  Current Facility-Administered Medications  Medication Dose Route Frequency Provider Last Rate Last Admin   0.9 %  sodium chloride infusion   Intravenous Continuous Kyle, Tyrone A, DO 100 mL/hr at 04/28/22 1450 New Bag at 04/28/22 1450   [START ON 04/29/2022] metoprolol succinate (TOPROL-XL) 24 hr tablet 50 mg  50 mg Oral q morning Narda Bonds, MD       morphine (PF) 4 MG/ML injection 4 mg  4 mg Intravenous Once Ronaldo Miyamoto, Tyrone A, DO       piperacillin-tazobactam (ZOSYN) IVPB 3.375 g  3.375 g Intravenous Q8H Pham, Anh P, RPH 12.5 mL/hr at 04/28/22 1755 3.375 g at 04/28/22 1755   prochlorperazine (COMPAZINE) injection 10 mg  10 mg Intravenous Q6H PRN Kyle, Tyrone A, DO         Objective: Vital: Vitals:   04/28/22 1400 04/28/22 1405 04/28/22 1410 04/28/22 1438  BP: (!) 152/65 (!) 146/78 (!) 138/91 (!) 126/59  Pulse: 62 69 63 64  Resp: 19 16 20  (!) 22  Temp:    98.6 F (37 C)  TempSrc:    Oral  SpO2: 100% 100% 99% 96%  Weight:      Height:       I/Os: I/O last 3 completed shifts: In: 1050.1 [I.V.:900.1; IV Piggyback:150] Out: -   Physical Exam:   General: Patient is in no apparent distress Lungs: Normal respiratory effort, chest expands symmetrically. GI:The abdomen is soft and nontender without mass. Ext: lower extremities symmetric  Lab Results: Recent Labs    04/27/22 0753 04/28/22 0431  WBC 12.6* 16.9*  HGB 10.6* 10.3*  HCT 35.4* 33.3*   Recent Labs    04/27/22 0753 04/28/22 0431  NA 136 137  K 4.2 4.2  CL 105 106  CO2 23 22  GLUCOSE 195* 203*  BUN 16 19  CREATININE 0.91 0.96  CALCIUM 9.7 9.4   Recent Labs    04/28/22 0431  INR 1.2   No results for input(s): "LABURIN" in the last 72 hours. Results for orders placed or performed during the hospital encounter of 04/27/22  Urine Culture     Status: Abnormal (Preliminary result)   Collection Time: 04/27/22  7:53 AM   Specimen: Urine, Clean Catch  Result Value Ref Range Status   Specimen Description   Final    URINE, CLEAN CATCH Performed at [facility], [address], [place], [place]    Special Requests   Final    NONE Performed at [facility], [address], [place], [place]    Culture (A)  Final    >=100,000 COLONIES/mL ESCHERICHIA COLI SUSCEPTIBILITIES TO FOLLOW Performed at [facility], [address], [place], [place]  Report Status PENDING  Incomplete  Culture, blood (Routine X 2) w Reflex to ID Panel     Status: None (Preliminary result)   Collection Time: 04/27/22  1:50 PM   Specimen: BLOOD  Result Value Ref Range Status   Specimen Description   Final    BLOOD RIGHT ANTECUBITAL Performed at [facility], [address], [place], [place] [address]    Special Requests   Final    BOTTLES DRAWN AEROBIC AND ANAEROBIC Blood Culture adequate volume Performed at [facility], [address] [place], [place] [address]    Culture   Final    NO GROWTH < 24 HOURS Performed at [facility], [address],  [place], [place] [address]    Report Status PENDING  Incomplete  Culture, blood (Routine X 2) w Reflex to ID Panel     Status: None (Preliminary result)   Collection Time: 04/27/22  9:10 PM   Specimen: BLOOD LEFT ARM  Result Value Ref Range Status   Specimen Description   Final    BLOOD LEFT ARM Performed at [facility], [address] [place], [place] [address]    Special Requests   Final    BOTTLES DRAWN AEROBIC AND ANAEROBIC Blood Culture adequate volume Performed at [facility], [address], [place], [place] [address]    Culture   Final    NO GROWTH < 24 HOURS Performed at [facility], [address], [place], [place] [address]    Report Status PENDING  Incomplete    Studies/Results: CT PELVIS LIMITED WO CONTRAST  Result Date: 04/28/2022 CLINICAL DATA:  Pelvic abscess.  Abscess drain placement EXAM: CT PELVIS WITHOUT CONTRAST TECHNIQUE: Multidetector CT imaging of the pelvis was performed following the standard protocol without intravenous contrast. RADIATION DOSE REDUCTION: This exam was performed according to the departmental dose-optimization program which includes automated exposure control, adjustment of the mA and/or kV according to patient size and/or use of iterative reconstruction technique. COMPARISON:  CT AP, 05/07/2022. FINDINGS: Informed written consent was obtained from the patient and/or patient's representative after a discussion of the risks, benefits and alternatives to treatment. The patient was placed prone on the CT gantry and a pre procedural CT was performed. A timeout was performed prior to the initiation of the procedure. Urinary Tract: Decompressed urinary bladder. Bilateral retrograde ureteral stents with pigtails coiled within the bladder. Bowel: Imaged portions of the small bowel and colon are nonobstructed. Sigmoid diverticulitis, best appreciated on recent comparison contrasted CT AP. Vascular/Lymphatic: No  pathologically enlarged lymph nodes. No significant vascular abnormality seen. Reproductive:  Normal appearance of the uterus and adnexa. Other: Deep pelvic abscess, measuring proximally 3.5 x 2.5 cm (AP by transaxial) with intervening viscus in prone position. Musculoskeletal: Obese.  No interval osseous abnormality. IMPRESSION: Aborted CT-guided drain placement of pelvic abscess. Small, deep pelvic abscess with intervening viscus in prone position. No safe window for percutaneous access. Roanna Banning, MD [facility] Radiology Electronically Signed   By: Roanna Banning M.D.   On: 04/28/2022 16:46   DG C-Arm 1-60 Min-No Report  Result Date: 04/27/2022 Fluoroscopy was utilized by the requesting physician.  No radiographic interpretation.   CT ABDOMEN PELVIS W CONTRAST  Result Date: 04/27/2022 CLINICAL DATA:  Left lower quadrant abdominal pain EXAM: CT ABDOMEN AND PELVIS WITH CONTRAST TECHNIQUE: Multidetector CT imaging of the abdomen and pelvis was performed using the standard protocol following bolus  administration of intravenous contrast. RADIATION DOSE REDUCTION: This exam was performed according to the departmental dose-optimization program which includes automated exposure control, adjustment of the mA and/or kV according to patient size and/or use of iterative reconstruction technique. CONTRAST:  OMNIPAQUE IOHEXOL 300 MG/ML  SOLN COMPARISON:  Study done earlier today FINDINGS: Lower chest: There are small linear densities in the lower lung fields, more so on the left side suggesting subsegmental atelectasis. Scattered coronary artery calcifications are seen. Hepatobiliary: There is fatty infiltration in liver. There is 1.7 cm cyst in the left lobe. There is no dilation of bile ducts. Gallbladder is unremarkable. Pancreas: No focal abnormalities are seen. Spleen: Spleen is not enlarged. There is subcentimeter low-density, possibly a cyst or hemangioma.  Adrenals/Urinary Tract: Adrenals are unremarkable. There is 7 mm calculus at the left ureteropelvic junction causing moderate left hydronephrosis. There are few bilateral renal stones, more so in the right kidney. There are 2 stones in the right renal pelvis measuring up to 8 mm. There is no hydronephrosis in the right kidney. Urinary bladder is not distended. There are few left renal cysts measuring up to 2.4 cm. Stomach/Bowel: Stomach is unremarkable. Small bowel loops are not dilated. Appendix is not seen. Diverticula seen in colon. There is wall thickening and pericolic stranding in sigmoid colon. There is 4 x 2.2 cm loculated fluid collection in the upper margin of sigmoid colon. There is another 2 x 1.2 cm fluid collection in the inferior margin of sigmoid. There is a linear loculated fluid collection in left side of pelvis measuring 8.6 x 2.1 cm with interval increase in size. Small amount of free fluid is seen in adjacent to the rectosigmoid with interval increase. Vascular/Lymphatic: Scattered arterial calcifications are seen. Reproductive: Unremarkable. Other: There is no ascites or pneumoperitoneum. There is a stranding in subcutaneous plane in the suprapubic region, possibly residual from previous surgery. Small umbilical hernia containing fat is seen. Musculoskeletal: No acute findings are seen. IMPRESSION: Diverticulosis of colon. There is focal wall thickening along with pericolic stranding in sigmoid colon suggesting acute diverticulitis. There are 2 loculated fluid collections each measuring less than 4 cm in size immediately adjacent to the sigmoid colon suggesting possible pericolic abscesses. There is a linear 8.6 x 2.1 cm loculated fluid collection in left side of pelvis medial to the iliac vessels with interval increase suggesting loculated serous fluid collection or another developing abscess related to acute diverticulitis. Small amount of free fluid is seen in pelvis. There is 7 mm calculus  at the left ureteropelvic junction causing moderate left hydronephrosis. Bilateral renal stones. There is no evidence of intestinal obstruction or pneumoperitoneum. Fatty liver. Linear densities in left lower lung fields suggest subsegmental atelectasis. Other findings as described in the body of the report. Electronically Signed   By: Ernie Avena M.D.   On: 04/27/2022 16:04   CT Renal Stone Study  Result Date: 04/27/2022 CLINICAL DATA:  Left lower quadrant and back pain. EXAM: CT ABDOMEN AND PELVIS WITHOUT CONTRAST TECHNIQUE: Multidetector CT imaging of the abdomen and pelvis was performed following the standard protocol without IV contrast. RADIATION DOSE REDUCTION: This exam was performed according to the departmental dose-optimization program which includes automated exposure control, adjustment of the mA and/or kV according to patient size and/or use of iterative reconstruction technique. COMPARISON:  08/25/2021 FINDINGS: Lower chest: No focal consolidation or pleural effusion. Hepatobiliary: The liver shows diffusely decreased attenuation suggesting fat deposition. Stable 14 mm subcapsular lesion lateral segment left liver compatible with  a cyst. No followup imaging is recommended. There is no evidence for gallstones, gallbladder wall thickening, or pericholecystic fluid. No intrahepatic or extrahepatic biliary dilation. Pancreas: No focal mass lesion. No dilatation of the main duct. No intraparenchymal cyst. No peripancreatic edema. Spleen: No splenomegaly. No focal mass lesion. Adrenals/Urinary Tract: No adrenal nodule or mass. Approximately 5 nonobstructing stones are seen in the right kidney including a 4 x 8 x 7 mm stone in the right renal pelvis. No right ureteral stone. There is moderate left hydronephrosis with multiple punctate nonobstructing stones in the right kidney. The hydronephrosis is secondary to the presence of a 4 x 7 x 9 mm stone at the left UPJ. 2.1 cm water density lesion  anterior interpolar left kidney is compatible with a cyst. No followup imaging is recommended. No left ureteral stone. Bladder is decompressed without evidence for stone disease. Stomach/Bowel: Stomach is unremarkable. No gastric wall thickening. No evidence of outlet obstruction. Duodenum is normally positioned as is the ligament of Treitz. No small bowel wall thickening. No small bowel dilatation. Status post right cecectomy. 4.7 x 3.9 x 3.8 cm ill-defined collection of soft tissue is identified immediately cranial to the proximal sigmoid colon. There is adjacent edema/inflammation in multiple scattered diverticuli are seen in the left colon. Left ovary may be just cranial and lateral to this collection. Vascular/Lymphatic: There is mild atherosclerotic calcification of the abdominal aorta without aneurysm. There is no gastrohepatic or hepatoduodenal ligament lymphadenopathy. No retroperitoneal or mesenteric lymphadenopathy. No pelvic sidewall lymphadenopathy. Reproductive: The uterus is unremarkable. No right adnexal mass. Left ovary is not well demonstrated discrete from the above described 4.7 cm soft tissue lesion adjacent to the sigmoid colon in the left pelvis. Other: There is some edema in the soft tissues of the pelvic floor. No substantial intraperitoneal free fluid Musculoskeletal: No worrisome lytic or sclerotic osseous abnormality. IMPRESSION: 1. 4.7 x 3.9 x 3.8 cm ill-defined collection of soft tissue immediately cranial to the proximal sigmoid colon. There is adjacent edema/inflammation and multiple scattered diverticuli in the left colon. Assessment is limited by lack of intravenous contrast material. Imaging features raise concern for soft tissue lesion in the left pelvis. Findings may reflect sequelae of diverticulitis and pericolonic abscess. Left ovary is not definitely seen distinct from this abnormality in left ovarian lesion is a possibility. Follow-up CT with oral and intravenous contrast  recommended to further evaluate. 2. Moderate left hydronephrosis secondary to the presence of a 4 x 7 x 9 mm stone at the left UPJ. 3. Bilateral nonobstructing nephrolithiasis. 4. Hepatic steatosis. 5.  Aortic Atherosclerosis (ICD10-I70.0). Electronically Signed   By: Kennith Center M.D.   On: 04/27/2022 09:03    Assessment: Bilateral renal/ureteral calculi UTI  Procedure(s): CYSTOSCOPY WITH RETROGRADE PYELOGRAM/URETERAL STENT PLACEMENT, 1 Day Post-Op  doing well.  Plan: Continue antibiotics until cultures return with sensitivities and then transition to p.o. antibiotic that covers this as well as something the general surgery recommends.  Abdominal collection per general surgery.  From a urological standpoint, stable for discharge once sensitivities return on the culture.  We will get her set up for ureteroscopy in 2 to 3 weeks.   Modena Slater, MD Urology 04/28/2022, 6:13 PM

## 2022-04-28 NOTE — Assessment & Plan Note (Signed)
Estimated body mass index is 40.5 kg/m as calculated from the following:   Height as of this encounter: 5' 2.5" (1.588 m).   Weight as of this encounter: 102.1 kg.

## 2022-04-28 NOTE — Procedures (Signed)
Vascular and Interventional Radiology Procedure Note  Patient: Heidi Peterson DOB: 1950/11/22 Medical Record Number: [id number] Note Date/Time: 04/28/22 2:23 PM   Performing Physician: Roanna Banning, MD Assistant(s): None  Diagnosis: Perforated diverticulitis  Procedure: *ABORTED* DRAINAGE CATHETER PLACEMENT OF PELVIC ABSCESS  Anesthesia: Conscious Sedation Complications: None Estimated Blood Loss:  0 mL Specimens: Sent for None  Findings:  Aborted  CT-guided drain placement.  Small, deep pelvic abscess with intervening viscus in prone position. No safe percutaneous window for access.   See detailed procedure note with images in PACS. The patient tolerated the procedure well without incident or complication and was returned to Floor Bed in stable condition.    Roanna Banning, MD [facility] Radiology   Pager. [phone] Clinic. [phone]

## 2022-04-28 NOTE — Assessment & Plan Note (Signed)
-   Continue metoprolol 

## 2022-04-28 NOTE — Progress Notes (Signed)
  Transition of Care [facility]) Screening Note   Patient Details  Name: Heidi Peterson Date of Birth: February 06, 1951   Transition of Care [facility]) CM/SW Contact:    Lanier Clam, RN Phone Number: 04/28/2022, 12:13 PM    Transition of Care Department [facility]) has reviewed patient and no TOC needs have been identified at this time. We will continue to monitor patient advancement through interdisciplinary progression rounds. If new patient transition needs arise, please place a TOC consult.

## 2022-04-28 NOTE — Progress Notes (Signed)
PROGRESS NOTE    Heidi Peterson  XBJ:[id number] DOB: 1951/06/07 DOA: 04/27/2022 PCP: [facility]   Brief Narrative: Heidi Peterson is a 71 y.o. female with a history of hypertension who presented secondary to abdominal and flank pain and found to have obstructive bilateral ureteral stones and diverticulitis with abscess. Urology and General surgery consulted. Patient underwent bilateral ureteral stent placement on 11/8.   Assessment and Plan: * Obstructive uropathy Secondary to left UPJ stone with associated bilateral non obstructing ureteral stones requiring bilateral ureteral stent placement by urology on 11/8. Creatinine stable.  Obesity, Class III, BMI 40-49.9 (morbid obesity) (HCC) Estimated body mass index is 40.5 kg/m as calculated from the following:   Height as of this encounter: 5' 2.5" (1.588 m).   Weight as of this encounter: 102.1 kg.  HTN (hypertension) -Continue metoprolol succinate  Hyperglycemia Hemoglobin A1C of 6.5%. Discussed with patient and she prefers lifestyle changes. Will allow follow-up with primary care physician for repeat hemoglobin A1C to confirm diagnosis of diabetes at that time.  Microcytic anemia Chronic. Stable. Iron panel with possible iron deficiency, although ferritin is normal. -Iron supplementation on discharge  Diverticulitis of intestine with abscess Empiric Zosyn initiated. General surgery consulted with recommendation for IR drain. IR attempted but could not find safe window for aspiration/drain placement. -Await further general surgery recommendations -Continue Zosyn IV  Complicated UTI (urinary tract infection) Associated obstructive uropathy with likely infected stone. Urine culture significant for GNRs. Patient empirically on Zosyn IV -Continue Zosyn IV and await culture data    DVT prophylaxis: SCDs Code Status:   Code Status: Full Code Family Communication: None at  bedside Disposition Plan: Discharge home pending specialist recommendations and transition to outpatient antibiotic regimen   Consultants:  General surgery Interventional radiology  Procedures:  11/8: Cystoscopy with bilateral retrograde pyelogram, bilateral ureteral stent placement   Antimicrobials: Zosyn IV    Subjective: Patient reports feeling better than on admission. Some mild right LQ pain.  Objective: BP (!) 126/59 (BP Location: Left Arm)   Pulse 64   Temp 98.6 F (37 C) (Oral)   Resp (!) 22   Ht 5' 2.5" (1.588 m)   Wt 102.1 kg   SpO2 96%   BMI 40.50 kg/m   Examination:  General exam: Appears calm and comfortable Respiratory system: Clear to auscultation. Respiratory effort normal. Cardiovascular system: S1 & S2 heard, RRR. No murmurs, rubs, gallops or clicks. Gastrointestinal system: Abdomen is non-distended, soft and mildly tender. Normal bowel sounds heard. Central nervous system: Alert and oriented. No focal neurological deficits. Musculoskeletal: No edema. No calf tenderness Skin: No cyanosis. No rashes Psychiatry: Judgement and insight appear normal. Mood & affect appropriate.    Data Reviewed: I have personally reviewed following labs and imaging studies  CBC Lab Results  Component Value Date   WBC 16.9 (H) 04/28/2022   RBC 4.42 04/28/2022   HGB 10.3 (L) 04/28/2022   HCT 33.3 (L) 04/28/2022   MCV 75.3 (L) 04/28/2022   MCH 23.3 (L) 04/28/2022   PLT 484 (H) 04/28/2022   MCHC 30.9 04/28/2022   RDW 16.5 (H) 04/28/2022   LYMPHSABS 1.5 04/27/2022   MONOABS 0.6 04/27/2022   EOSABS 0.1 04/27/2022   BASOSABS 0.0 04/27/2022     Last metabolic panel Lab Results  Component Value Date   NA 137 04/28/2022   K 4.2 04/28/2022   CL 106 04/28/2022   CO2 22 04/28/2022   BUN 19 04/28/2022   CREATININE 0.96 04/28/2022  GLUCOSE 203 (H) 04/28/2022   GFRNONAA >60 04/28/2022   CALCIUM 9.4 04/28/2022   PROT 7.3 04/28/2022   ALBUMIN 3.0 (L) 04/28/2022    BILITOT 0.6 04/28/2022   ALKPHOS 77 04/28/2022   AST 17 04/28/2022   ALT 18 04/28/2022   ANIONGAP 9 04/28/2022    GFR: Estimated Creatinine Clearance: 61.6 mL/min (by C-G formula based on SCr of 0.96 mg/dL).  Recent Results (from the past 240 hour(s))  Urine Culture     Status: Abnormal (Preliminary result)   Collection Time: 04/27/22  7:53 AM   Specimen: Urine, Clean Catch  Result Value Ref Range Status   Specimen Description   Final    URINE, CLEAN CATCH Performed at [facility], [address] [place], [place] [address]    Special Requests   Final    NONE Performed at [facility], [address] [place], [place] [address]    Culture (A)  Final    >=100,000 COLONIES/mL ESCHERICHIA COLI SUSCEPTIBILITIES TO FOLLOW Performed at [facility], [address] [place], [place] [address]    Report Status PENDING  Incomplete  Culture, blood (Routine X 2) w Reflex to ID Panel     Status: None (Preliminary result)   Collection Time: 04/27/22  1:50 PM   Specimen: BLOOD  Result Value Ref Range Status   Specimen Description   Final    BLOOD RIGHT ANTECUBITAL Performed at [facility], [address], [place], [place] [address]    Special Requests   Final    BOTTLES DRAWN AEROBIC AND ANAEROBIC Blood Culture adequate volume Performed at [facility], [address] [place], [place] [address]    Culture   Final    NO GROWTH < 24 HOURS Performed at [facility], [address], [place], [place] [address]    Report Status PENDING  Incomplete  Culture, blood (Routine X 2) w Reflex to ID Panel     Status: None (Preliminary result)   Collection Time: 04/27/22  9:10 PM   Specimen: BLOOD LEFT ARM  Result Value Ref Range Status   Specimen Description   Final    BLOOD LEFT ARM Performed at [facility], [address] [place], [place] [address]    Special Requests   Final     BOTTLES DRAWN AEROBIC AND ANAEROBIC Blood Culture adequate volume Performed at [facility], [address] [place], [place] [address]    Culture   Final    NO GROWTH < 24 HOURS Performed at [facility], [address] [place], [place] [address]    Report Status PENDING  Incomplete      Radiology Studies: DG C-Arm 1-60 Min-No Report  Result Date: 04/27/2022 Fluoroscopy was utilized by the requesting physician.  No radiographic interpretation.   CT ABDOMEN PELVIS W CONTRAST  Result Date: 04/27/2022 CLINICAL DATA:  Left lower quadrant abdominal pain EXAM: CT ABDOMEN AND PELVIS WITH CONTRAST TECHNIQUE: Multidetector CT imaging of the abdomen and pelvis was performed using the standard protocol following bolus administration of intravenous contrast. RADIATION DOSE REDUCTION: This exam was performed according to the departmental dose-optimization program which includes automated exposure control, adjustment of the mA and/or kV according to patient size and/or use of iterative reconstruction technique. CONTRAST:  OMNIPAQUE IOHEXOL 300 MG/ML  SOLN COMPARISON:  Study done earlier today FINDINGS: Lower chest: There are small linear densities in the lower lung fields,  more so on the left side suggesting subsegmental atelectasis. Scattered coronary artery calcifications are seen. Hepatobiliary: There is fatty infiltration in liver. There is 1.7 cm cyst in the left lobe. There is no dilation of bile ducts. Gallbladder is unremarkable. Pancreas: No focal abnormalities are seen. Spleen: Spleen is not enlarged. There is subcentimeter low-density, possibly a cyst or hemangioma. Adrenals/Urinary Tract: Adrenals are unremarkable. There is 7 mm calculus at the left ureteropelvic junction causing moderate left hydronephrosis. There are few bilateral renal stones, more so in the right kidney. There are 2 stones in the right renal pelvis measuring up to 8 mm. There is no  hydronephrosis in the right kidney. Urinary bladder is not distended. There are few left renal cysts measuring up to 2.4 cm. Stomach/Bowel: Stomach is unremarkable. Small bowel loops are not dilated. Appendix is not seen. Diverticula seen in colon. There is wall thickening and pericolic stranding in sigmoid colon. There is 4 x 2.2 cm loculated fluid collection in the upper margin of sigmoid colon. There is another 2 x 1.2 cm fluid collection in the inferior margin of sigmoid. There is a linear loculated fluid collection in left side of pelvis measuring 8.6 x 2.1 cm with interval increase in size. Small amount of free fluid is seen in adjacent to the rectosigmoid with interval increase. Vascular/Lymphatic: Scattered arterial calcifications are seen. Reproductive: Unremarkable. Other: There is no ascites or pneumoperitoneum. There is a stranding in subcutaneous plane in the suprapubic region, possibly residual from previous surgery. Small umbilical hernia containing fat is seen. Musculoskeletal: No acute findings are seen. IMPRESSION: Diverticulosis of colon. There is focal wall thickening along with pericolic stranding in sigmoid colon suggesting acute diverticulitis. There are 2 loculated fluid collections each measuring less than 4 cm in size immediately adjacent to the sigmoid colon suggesting possible pericolic abscesses. There is a linear 8.6 x 2.1 cm loculated fluid collection in left side of pelvis medial to the iliac vessels with interval increase suggesting loculated serous fluid collection or another developing abscess related to acute diverticulitis. Small amount of free fluid is seen in pelvis. There is 7 mm calculus at the left ureteropelvic junction causing moderate left hydronephrosis. Bilateral renal stones. There is no evidence of intestinal obstruction or pneumoperitoneum. Fatty liver. Linear densities in left lower lung fields suggest subsegmental atelectasis. Other findings as described in the  body of the report. Electronically Signed   By: Ernie Avena M.D.   On: 04/27/2022 16:04   CT Renal Stone Study  Result Date: 04/27/2022 CLINICAL DATA:  Left lower quadrant and back pain. EXAM: CT ABDOMEN AND PELVIS WITHOUT CONTRAST TECHNIQUE: Multidetector CT imaging of the abdomen and pelvis was performed following the standard protocol without IV contrast. RADIATION DOSE REDUCTION: This exam was performed according to the departmental dose-optimization program which includes automated exposure control, adjustment of the mA and/or kV according to patient size and/or use of iterative reconstruction technique. COMPARISON:  08/25/2021 FINDINGS: Lower chest: No focal consolidation or pleural effusion. Hepatobiliary: The liver shows diffusely decreased attenuation suggesting fat deposition. Stable 14 mm subcapsular lesion lateral segment left liver compatible with a cyst. No followup imaging is recommended. There is no evidence for gallstones, gallbladder wall thickening, or pericholecystic fluid. No intrahepatic or extrahepatic biliary dilation. Pancreas: No focal mass lesion. No dilatation of the main duct. No intraparenchymal cyst. No peripancreatic edema. Spleen: No splenomegaly. No focal mass lesion. Adrenals/Urinary Tract: No adrenal nodule or mass. Approximately 5 nonobstructing stones are seen in the right kidney  including a 4 x 8 x 7 mm stone in the right renal pelvis. No right ureteral stone. There is moderate left hydronephrosis with multiple punctate nonobstructing stones in the right kidney. The hydronephrosis is secondary to the presence of a 4 x 7 x 9 mm stone at the left UPJ. 2.1 cm water density lesion anterior interpolar left kidney is compatible with a cyst. No followup imaging is recommended. No left ureteral stone. Bladder is decompressed without evidence for stone disease. Stomach/Bowel: Stomach is unremarkable. No gastric wall thickening. No evidence of outlet obstruction. Duodenum is  normally positioned as is the ligament of Treitz. No small bowel wall thickening. No small bowel dilatation. Status post right cecectomy. 4.7 x 3.9 x 3.8 cm ill-defined collection of soft tissue is identified immediately cranial to the proximal sigmoid colon. There is adjacent edema/inflammation in multiple scattered diverticuli are seen in the left colon. Left ovary may be just cranial and lateral to this collection. Vascular/Lymphatic: There is mild atherosclerotic calcification of the abdominal aorta without aneurysm. There is no gastrohepatic or hepatoduodenal ligament lymphadenopathy. No retroperitoneal or mesenteric lymphadenopathy. No pelvic sidewall lymphadenopathy. Reproductive: The uterus is unremarkable. No right adnexal mass. Left ovary is not well demonstrated discrete from the above described 4.7 cm soft tissue lesion adjacent to the sigmoid colon in the left pelvis. Other: There is some edema in the soft tissues of the pelvic floor. No substantial intraperitoneal free fluid Musculoskeletal: No worrisome lytic or sclerotic osseous abnormality. IMPRESSION: 1. 4.7 x 3.9 x 3.8 cm ill-defined collection of soft tissue immediately cranial to the proximal sigmoid colon. There is adjacent edema/inflammation and multiple scattered diverticuli in the left colon. Assessment is limited by lack of intravenous contrast material. Imaging features raise concern for soft tissue lesion in the left pelvis. Findings may reflect sequelae of diverticulitis and pericolonic abscess. Left ovary is not definitely seen distinct from this abnormality in left ovarian lesion is a possibility. Follow-up CT with oral and intravenous contrast recommended to further evaluate. 2. Moderate left hydronephrosis secondary to the presence of a 4 x 7 x 9 mm stone at the left UPJ. 3. Bilateral nonobstructing nephrolithiasis. 4. Hepatic steatosis. 5.  Aortic Atherosclerosis (ICD10-I70.0). Electronically Signed   By: Kennith Center M.D.   On:  04/27/2022 09:03      LOS: 1 day    Jacquelin Hawking, MD [facility] 04/28/2022, 3:01 PM   If 7PM-7AM, please contact night-coverage [email or web address]

## 2022-04-28 NOTE — Assessment & Plan Note (Addendum)
Associated obstructive uropathy with likely infected stone. Urine culture significant for pan-sensitive E. Coli. Patient empirically on Zosyn IV. -Continue Zosyn IV secondary to intraabdominal infection

## 2022-04-28 NOTE — Assessment & Plan Note (Signed)
Secondary to left UPJ stone with associated bilateral non obstructing ureteral stones requiring bilateral ureteral stent placement by urology on 11/8. Creatinine stable.

## 2022-04-28 NOTE — Progress Notes (Signed)
[facility] Progress Note  1 Day Post-Op  Subjective: CC-  Sore from ureteral stent placement but denies any abdominal pain at this time. No n/v. WBC up 16.7, TMAX 101.7 IR planning drain placement today.  Objective: Vital signs in last 24 hours: Temp:  [97.7 F (36.5 C)-101.7 F (38.7 C)] 98.5 F (36.9 C) (11/09 1237) Pulse Rate:  [56-96] 56 (11/09 1237) Resp:  [15-22] 18 (11/09 1237) BP: (100-169)/(41-83) 127/59 (11/09 1237) SpO2:  [91 %-100 %] 95 % (11/09 0818) Weight:  [102.1 kg] 102.1 kg (11/08 1628) Last BM Date : 04/26/22 (per pt "very small")  Intake/Output from previous day: 11/08 0701 - 11/09 0700 In: 1050.1 [I.V.:900.1; IV Piggyback:150] Out: -  Intake/Output this shift: Total I/O In: -  Out: 200 [Urine:200]  PE: Gen:  Alert, NAD, pleasant Abd: soft, ND, +BS, lap and midline incisions well healed. Nontender currently  Lab Results:  Recent Labs    04/27/22 0753 04/28/22 0431  WBC 12.6* 16.9*  HGB 10.6* 10.3*  HCT 35.4* 33.3*  PLT 501* 484*   BMET Recent Labs    04/27/22 0753 04/28/22 0431  NA 136 137  K 4.2 4.2  CL 105 106  CO2 23 22  GLUCOSE 195* 203*  BUN 16 19  CREATININE 0.91 0.96  CALCIUM 9.7 9.4   PT/INR Recent Labs    04/28/22 0431  LABPROT 15.1  INR 1.2   CMP     Component Value Date/Time   NA 137 04/28/2022 0431   K 4.2 04/28/2022 0431   CL 106 04/28/2022 0431   CO2 22 04/28/2022 0431   GLUCOSE 203 (H) 04/28/2022 0431   BUN 19 04/28/2022 0431   CREATININE 0.96 04/28/2022 0431   CALCIUM 9.4 04/28/2022 0431   PROT 7.3 04/28/2022 0431   ALBUMIN 3.0 (L) 04/28/2022 0431   AST 17 04/28/2022 0431   ALT 18 04/28/2022 0431   ALKPHOS 77 04/28/2022 0431   BILITOT 0.6 04/28/2022 0431   GFRNONAA >60 04/28/2022 0431   Lipase     Component Value Date/Time   LIPASE 26 04/27/2022 0753       Studies/Results: DG C-Arm 1-60 Min-No Report  Result Date: 04/27/2022 Fluoroscopy was utilized by the requesting  physician.  No radiographic interpretation.   CT ABDOMEN PELVIS W CONTRAST  Result Date: 04/27/2022 CLINICAL DATA:  Left lower quadrant abdominal pain EXAM: CT ABDOMEN AND PELVIS WITH CONTRAST TECHNIQUE: Multidetector CT imaging of the abdomen and pelvis was performed using the standard protocol following bolus administration of intravenous contrast. RADIATION DOSE REDUCTION: This exam was performed according to the departmental dose-optimization program which includes automated exposure control, adjustment of the mA and/or kV according to patient size and/or use of iterative reconstruction technique. CONTRAST:  OMNIPAQUE IOHEXOL 300 MG/ML  SOLN COMPARISON:  Study done earlier today FINDINGS: Lower chest: There are small linear densities in the lower lung fields, more so on the left side suggesting subsegmental atelectasis. Scattered coronary artery calcifications are seen. Hepatobiliary: There is fatty infiltration in liver. There is 1.7 cm cyst in the left lobe. There is no dilation of bile ducts. Gallbladder is unremarkable. Pancreas: No focal abnormalities are seen. Spleen: Spleen is not enlarged. There is subcentimeter low-density, possibly a cyst or hemangioma. Adrenals/Urinary Tract: Adrenals are unremarkable. There is 7 mm calculus at the left ureteropelvic junction causing moderate left hydronephrosis. There are few bilateral renal stones, more so in the right kidney. There are 2 stones in the right renal pelvis measuring up  to 8 mm. There is no hydronephrosis in the right kidney. Urinary bladder is not distended. There are few left renal cysts measuring up to 2.4 cm. Stomach/Bowel: Stomach is unremarkable. Small bowel loops are not dilated. Appendix is not seen. Diverticula seen in colon. There is wall thickening and pericolic stranding in sigmoid colon. There is 4 x 2.2 cm loculated fluid collection in the upper margin of sigmoid colon. There is another 2 x 1.2 cm fluid collection in the  inferior margin of sigmoid. There is a linear loculated fluid collection in left side of pelvis measuring 8.6 x 2.1 cm with interval increase in size. Small amount of free fluid is seen in adjacent to the rectosigmoid with interval increase. Vascular/Lymphatic: Scattered arterial calcifications are seen. Reproductive: Unremarkable. Other: There is no ascites or pneumoperitoneum. There is a stranding in subcutaneous plane in the suprapubic region, possibly residual from previous surgery. Small umbilical hernia containing fat is seen. Musculoskeletal: No acute findings are seen. IMPRESSION: Diverticulosis of colon. There is focal wall thickening along with pericolic stranding in sigmoid colon suggesting acute diverticulitis. There are 2 loculated fluid collections each measuring less than 4 cm in size immediately adjacent to the sigmoid colon suggesting possible pericolic abscesses. There is a linear 8.6 x 2.1 cm loculated fluid collection in left side of pelvis medial to the iliac vessels with interval increase suggesting loculated serous fluid collection or another developing abscess related to acute diverticulitis. Small amount of free fluid is seen in pelvis. There is 7 mm calculus at the left ureteropelvic junction causing moderate left hydronephrosis. Bilateral renal stones. There is no evidence of intestinal obstruction or pneumoperitoneum. Fatty liver. Linear densities in left lower lung fields suggest subsegmental atelectasis. Other findings as described in the body of the report. Electronically Signed   By: Ernie Avena M.D.   On: 04/27/2022 16:04   CT Renal Stone Study  Result Date: 04/27/2022 CLINICAL DATA:  Left lower quadrant and back pain. EXAM: CT ABDOMEN AND PELVIS WITHOUT CONTRAST TECHNIQUE: Multidetector CT imaging of the abdomen and pelvis was performed following the standard protocol without IV contrast. RADIATION DOSE REDUCTION: This exam was performed according to the departmental  dose-optimization program which includes automated exposure control, adjustment of the mA and/or kV according to patient size and/or use of iterative reconstruction technique. COMPARISON:  08/25/2021 FINDINGS: Lower chest: No focal consolidation or pleural effusion. Hepatobiliary: The liver shows diffusely decreased attenuation suggesting fat deposition. Stable 14 mm subcapsular lesion lateral segment left liver compatible with a cyst. No followup imaging is recommended. There is no evidence for gallstones, gallbladder wall thickening, or pericholecystic fluid. No intrahepatic or extrahepatic biliary dilation. Pancreas: No focal mass lesion. No dilatation of the main duct. No intraparenchymal cyst. No peripancreatic edema. Spleen: No splenomegaly. No focal mass lesion. Adrenals/Urinary Tract: No adrenal nodule or mass. Approximately 5 nonobstructing stones are seen in the right kidney including a 4 x 8 x 7 mm stone in the right renal pelvis. No right ureteral stone. There is moderate left hydronephrosis with multiple punctate nonobstructing stones in the right kidney. The hydronephrosis is secondary to the presence of a 4 x 7 x 9 mm stone at the left UPJ. 2.1 cm water density lesion anterior interpolar left kidney is compatible with a cyst. No followup imaging is recommended. No left ureteral stone. Bladder is decompressed without evidence for stone disease. Stomach/Bowel: Stomach is unremarkable. No gastric wall thickening. No evidence of outlet obstruction. Duodenum is normally positioned as is the  ligament of Treitz. No small bowel wall thickening. No small bowel dilatation. Status post right cecectomy. 4.7 x 3.9 x 3.8 cm ill-defined collection of soft tissue is identified immediately cranial to the proximal sigmoid colon. There is adjacent edema/inflammation in multiple scattered diverticuli are seen in the left colon. Left ovary may be just cranial and lateral to this collection. Vascular/Lymphatic: There is  mild atherosclerotic calcification of the abdominal aorta without aneurysm. There is no gastrohepatic or hepatoduodenal ligament lymphadenopathy. No retroperitoneal or mesenteric lymphadenopathy. No pelvic sidewall lymphadenopathy. Reproductive: The uterus is unremarkable. No right adnexal mass. Left ovary is not well demonstrated discrete from the above described 4.7 cm soft tissue lesion adjacent to the sigmoid colon in the left pelvis. Other: There is some edema in the soft tissues of the pelvic floor. No substantial intraperitoneal free fluid Musculoskeletal: No worrisome lytic or sclerotic osseous abnormality. IMPRESSION: 1. 4.7 x 3.9 x 3.8 cm ill-defined collection of soft tissue immediately cranial to the proximal sigmoid colon. There is adjacent edema/inflammation and multiple scattered diverticuli in the left colon. Assessment is limited by lack of intravenous contrast material. Imaging features raise concern for soft tissue lesion in the left pelvis. Findings may reflect sequelae of diverticulitis and pericolonic abscess. Left ovary is not definitely seen distinct from this abnormality in left ovarian lesion is a possibility. Follow-up CT with oral and intravenous contrast recommended to further evaluate. 2. Moderate left hydronephrosis secondary to the presence of a 4 x 7 x 9 mm stone at the left UPJ. 3. Bilateral nonobstructing nephrolithiasis. 4. Hepatic steatosis. 5.  Aortic Atherosclerosis (ICD10-I70.0). Electronically Signed   By: Kennith Center M.D.   On: 04/27/2022 09:03    Anti-infectives: Anti-infectives (From admission, onward)    Start     Dose/Rate Route Frequency Ordered Stop   04/27/22 1800  piperacillin-tazobactam (ZOSYN) IVPB 3.375 g        3.375 g 12.5 mL/hr over 240 Minutes Intravenous Every 8 hours 04/27/22 1452     04/27/22 1200  piperacillin-tazobactam (ZOSYN) IVPB 3.375 g        3.375 g 100 mL/hr over 30 Minutes Intravenous  Once 04/27/22 1157 04/27/22 1309         Assessment/Plan  Sigmoid diverticulitis with multiple abscesses - 1st bout, never had a colonoscopy - CT 11/8 shows acute sigmoid diverticulitis with 2 loculated fluid collections each measuring less than 4 cm in size immediately adjacent to the sigmoid colon suggesting possible pericolic abscesses; there is a linear 8.6 x 2.1 cm loculated fluid collection in left side of pelvis medial to the iliac vessels with interval increase suggesting loculated serous fluid collection or another developing abscess; no pneumoperitoneum - IR consult today for drain placement. Continue IV zosyn. Ok for liquid diet after procedure. Ultimately if this resolves with conservative measures patient will need outpatient GI follow up for colonoscopy in 6-8 weeks.  ID - zosyn 11/8 FEN - IVF, NPO VTE - ok for chemical dvt ppx after IR procedure from our standpoint Foley - none  R renal calculi, L UPJ stone with hydronephrosis, UTI - s/p bilateral ureteral stent placement 11/8, urology following HTN Obesity BMI 40.50  I reviewed Consultant interventional radiology notes, hospitalist notes, last 24 h vitals and pain scores, last 48 h intake and output, last 24 h labs and trends, and last 24 h imaging results.    LOS: 1 day    Franne Forts, [facility] 04/28/2022, 12:52 PM Please see Amion for pager  number during day hours 7:00am-4:30pm

## 2022-04-28 NOTE — Hospital Course (Addendum)
Heidi Peterson is a 71 y.o. female with a history of hypertension who presented secondary to abdominal and flank pain and found to have a left obstructive UPJ stone and diverticulitis with abscess. Urology and General surgery consulted. Patient underwent bilateral ureteral stent placement on 11/8. Attempt to place drain into diverticular abscess unsuccessful.

## 2022-04-28 NOTE — Assessment & Plan Note (Addendum)
Empiric Zosyn initiated. General surgery consulted with recommendation for IR drain. IR attempted but could not find safe window for aspiration/drain placement. WBC stable. -Await further general surgery recommendations -Continue Zosyn IV

## 2022-04-29 DIAGNOSIS — I10 Essential (primary) hypertension: Secondary | ICD-10-CM | POA: Diagnosis not present

## 2022-04-29 DIAGNOSIS — K5720 Diverticulitis of large intestine with perforation and abscess without bleeding: Secondary | ICD-10-CM | POA: Diagnosis not present

## 2022-04-29 DIAGNOSIS — N139 Obstructive and reflux uropathy, unspecified: Secondary | ICD-10-CM | POA: Diagnosis not present

## 2022-04-29 DIAGNOSIS — N390 Urinary tract infection, site not specified: Secondary | ICD-10-CM | POA: Diagnosis not present

## 2022-04-29 LAB — CBC
HCT: 31 % — ABNORMAL LOW (ref 36.0–46.0)
Hemoglobin: 9.4 g/dL — ABNORMAL LOW (ref 12.0–15.0)
MCH: 23.2 pg — ABNORMAL LOW (ref 26.0–34.0)
MCHC: 30.3 g/dL (ref 30.0–36.0)
MCV: 76.5 fL — ABNORMAL LOW (ref 80.0–100.0)
Platelets: 481 10*3/uL — ABNORMAL HIGH (ref 150–400)
RBC: 4.05 MIL/uL (ref 3.87–5.11)
RDW: 16.7 % — ABNORMAL HIGH (ref 11.5–15.5)
WBC: 17.6 10*3/uL — ABNORMAL HIGH (ref 4.0–10.5)
nRBC: 0 % (ref 0.0–0.2)

## 2022-04-29 LAB — BASIC METABOLIC PANEL
Anion gap: 7 (ref 5–15)
BUN: 22 mg/dL (ref 8–23)
CO2: 23 mmol/L (ref 22–32)
Calcium: 9.1 mg/dL (ref 8.9–10.3)
Chloride: 109 mmol/L (ref 98–111)
Creatinine, Ser: 0.95 mg/dL (ref 0.44–1.00)
GFR, Estimated: >60 mL/min (ref >60)
Glucose, Bld: 158 mg/dL — ABNORMAL HIGH (ref 70–99)
Potassium: 3.7 mmol/L (ref 3.5–5.1)
Sodium: 139 mmol/L (ref 135–145)

## 2022-04-29 LAB — URINE CULTURE: Culture: >=100000 — AB

## 2022-04-29 MED ORDER — OXYCODONE HCL 5 MG PO TABS
5.0000 mg | ORAL_TABLET | Freq: Once | ORAL | Status: AC
  Administered 2022-04-30: 5 mg via ORAL
  Filled 2022-04-29: qty 1

## 2022-04-29 MED ORDER — SODIUM CHLORIDE 0.45 % IV SOLN: INTRAVENOUS | Status: DC

## 2022-04-29 NOTE — Progress Notes (Signed)
PROGRESS NOTE    Heidi Peterson  DXI:[id number] DOB: 1951-03-13 DOA: 04/27/2022 PCP: [facility]   Brief Narrative: Heidi Peterson is a 71 y.o. female with a history of hypertension who presented secondary to abdominal and flank pain and found to have a left obstructive UPJ stone and diverticulitis with abscess. Urology and General surgery consulted. Patient underwent bilateral ureteral stent placement on 11/8.   Assessment and Plan: * Obstructive uropathy Secondary to left UPJ stone with associated bilateral non obstructing ureteral stones requiring bilateral ureteral stent placement by urology on 11/8. Creatinine stable.  Obesity, Class III, BMI 40-49.9 (morbid obesity) (HCC) Estimated body mass index is 40.5 kg/m as calculated from the following:   Height as of this encounter: 5' 2.5" (1.588 m).   Weight as of this encounter: 102.1 kg.  HTN (hypertension) -Continue metoprolol succinate  Hyperglycemia Hemoglobin A1C of 6.5%. Discussed with patient and she prefers lifestyle changes. Will allow follow-up with primary care physician for repeat hemoglobin A1C to confirm diagnosis of diabetes at that time.  Microcytic anemia Chronic. Stable. Iron panel with possible iron deficiency, although ferritin is normal. -Iron supplementation on discharge  Diverticulitis of intestine with abscess Empiric Zosyn initiated. General surgery consulted with recommendation for IR drain. IR attempted but could not find safe window for aspiration/drain placement. WBC stable. -Await further general surgery recommendations -Continue Zosyn IV  Complicated UTI (urinary tract infection) Associated obstructive uropathy with likely infected stone. Urine culture significant for pan-sensitive E. Coli. Patient empirically on Zosyn IV. -Continue Zosyn IV secondary to intraabdominal infection    DVT prophylaxis: SCDs Code Status:   Code Status: Full Code Family  Communication: Husband at bedside Disposition Plan: Discharge home pending specialist recommendations and transition to outpatient antibiotic regimen   Consultants:  General surgery Interventional radiology  Procedures:  11/8: Cystoscopy with bilateral retrograde pyelogram, bilateral ureteral stent placement   Antimicrobials: Zosyn IV    Subjective: Patient is feeling okay today. Some mild abdominal pain secondary to stents.   Objective: BP 133/61 (BP Location: Left Arm)   Pulse 72   Temp 97.6 F (36.4 C) (Oral)   Resp 17   Ht 5' 2.5" (1.588 m)   Wt 102.1 kg   SpO2 94%   BMI 40.50 kg/m   Examination:  General exam: Appears calm and comfortable Respiratory system: Clear to auscultation. Respiratory effort normal. Cardiovascular system: S1 & S2 heard, RRR. Gastrointestinal system: Abdomen is non-distended, soft and nontender.  Normal bowel sounds heard. Central nervous system: Alert and oriented. No focal neurological deficits. Musculoskeletal: No edema. No calf tenderness Skin: No cyanosis. No rashes Psychiatry: Judgement and insight appear normal. Mood & affect appropriate.    Data Reviewed: I have personally reviewed following labs and imaging studies  CBC Lab Results  Component Value Date   WBC 17.6 (H) 04/29/2022   RBC 4.05 04/29/2022   HGB 9.4 (L) 04/29/2022   HCT 31.0 (L) 04/29/2022   MCV 76.5 (L) 04/29/2022   MCH 23.2 (L) 04/29/2022   PLT 481 (H) 04/29/2022   MCHC 30.3 04/29/2022   RDW 16.7 (H) 04/29/2022   LYMPHSABS 1.5 04/27/2022   MONOABS 0.6 04/27/2022   EOSABS 0.1 04/27/2022   BASOSABS 0.0 04/27/2022     Last metabolic panel Lab Results  Component Value Date   NA 139 04/29/2022   K 3.7 04/29/2022   CL 109 04/29/2022   CO2 23 04/29/2022   BUN 22 04/29/2022   CREATININE 0.95 04/29/2022  GLUCOSE 158 (H) 04/29/2022   GFRNONAA >60 04/29/2022   CALCIUM 9.1 04/29/2022   PROT 7.3 04/28/2022   ALBUMIN 3.0 (L) 04/28/2022   BILITOT 0.6  04/28/2022   ALKPHOS 77 04/28/2022   AST 17 04/28/2022   ALT 18 04/28/2022   ANIONGAP 7 04/29/2022    GFR: Estimated Creatinine Clearance: 62.3 mL/min (by C-G formula based on SCr of 0.95 mg/dL).  Recent Results (from the past 240 hour(s))  Urine Culture     Status: Abnormal   Collection Time: 04/27/22  7:53 AM   Specimen: Urine, Clean Catch  Result Value Ref Range Status   Specimen Description   Final    URINE, CLEAN CATCH Performed at [facility], [address] [place], [place] [address]    Special Requests   Final    NONE Performed at [facility], [address] [place], [place] [address]    Culture >=100,000 COLONIES/mL ESCHERICHIA COLI (A)  Final   Report Status 04/29/2022 FINAL  Final   Organism ID, Bacteria ESCHERICHIA COLI (A)  Final      Susceptibility   Escherichia coli - MIC*    AMPICILLIN 4 SENSITIVE Sensitive     CEFAZOLIN <=4 SENSITIVE Sensitive     CEFEPIME <=0.12 SENSITIVE Sensitive     CEFTRIAXONE <=0.25 SENSITIVE Sensitive     CIPROFLOXACIN <=0.25 SENSITIVE Sensitive     GENTAMICIN <=1 SENSITIVE Sensitive     IMIPENEM <=0.25 SENSITIVE Sensitive     NITROFURANTOIN <=16 SENSITIVE Sensitive     TRIMETH/SULFA <=20 SENSITIVE Sensitive     AMPICILLIN/SULBACTAM <=2 SENSITIVE Sensitive     PIP/TAZO <=4 SENSITIVE Sensitive     * >=100,000 COLONIES/mL ESCHERICHIA COLI  Culture, blood (Routine X 2) w Reflex to ID Panel     Status: None (Preliminary result)   Collection Time: 04/27/22  1:50 PM   Specimen: BLOOD  Result Value Ref Range Status   Specimen Description   Final    BLOOD RIGHT ANTECUBITAL Performed at [facility], [address], [place], [place] [address]    Special Requests   Final    BOTTLES DRAWN AEROBIC AND ANAEROBIC Blood Culture adequate volume Performed at [facility], [address], [place], [place] [address]    Culture   Final    NO GROWTH 2  DAYS Performed at [facility], [address] [place], [place] [address]    Report Status PENDING  Incomplete  Culture, blood (Routine X 2) w Reflex to ID Panel     Status: None (Preliminary result)   Collection Time: 04/27/22  9:10 PM   Specimen: BLOOD LEFT ARM  Result Value Ref Range Status   Specimen Description   Final    BLOOD LEFT ARM Performed at [facility], [address] [place], [place] [address]    Special Requests   Final    BOTTLES DRAWN AEROBIC AND ANAEROBIC Blood Culture adequate volume Performed at [facility], [address] [place], [place] [address]    Culture   Final    NO GROWTH 2 DAYS Performed at [facility], [address], [place], [place] [address]    Report Status PENDING  Incomplete      Radiology Studies: CT PELVIS LIMITED WO CONTRAST  Result Date: 04/28/2022 CLINICAL DATA:  Pelvic abscess.  Abscess drain placement EXAM: CT PELVIS WITHOUT CONTRAST TECHNIQUE: Multidetector CT imaging of the pelvis was performed following the  standard protocol without intravenous contrast. RADIATION DOSE REDUCTION: This exam was performed according to the departmental dose-optimization program which includes automated exposure control, adjustment of the mA and/or kV according to patient size and/or use of iterative reconstruction technique. COMPARISON:  CT AP, 05/07/2022. FINDINGS: Informed written consent was obtained from the patient and/or patient's representative after a discussion of the risks, benefits and alternatives to treatment. The patient was placed prone on the CT gantry and a pre procedural CT was performed. A timeout was performed prior to the initiation of the procedure. Urinary Tract: Decompressed urinary bladder. Bilateral retrograde ureteral stents with pigtails coiled within the bladder. Bowel: Imaged portions of the small bowel and colon are nonobstructed. Sigmoid diverticulitis, best appreciated on  recent comparison contrasted CT AP. Vascular/Lymphatic: No pathologically enlarged lymph nodes. No significant vascular abnormality seen. Reproductive:  Normal appearance of the uterus and adnexa. Other: Deep pelvic abscess, measuring proximally 3.5 x 2.5 cm (AP by transaxial) with intervening viscus in prone position. Musculoskeletal: Obese.  No interval osseous abnormality. IMPRESSION: Aborted CT-guided drain placement of pelvic abscess. Small, deep pelvic abscess with intervening viscus in prone position. No safe window for percutaneous access. Roanna Banning, MD [facility] Electronically Signed   By: Roanna Banning M.D.   On: 04/28/2022 16:46   DG C-Arm 1-60 Min-No Report  Result Date: 04/27/2022 Fluoroscopy was utilized by the requesting physician.  No radiographic interpretation.   CT ABDOMEN PELVIS W CONTRAST  Result Date: 04/27/2022 CLINICAL DATA:  Left lower quadrant abdominal pain EXAM: CT ABDOMEN AND PELVIS WITH CONTRAST TECHNIQUE: Multidetector CT imaging of the abdomen and pelvis was performed using the standard protocol following bolus administration of intravenous contrast. RADIATION DOSE REDUCTION: This exam was performed according to the departmental dose-optimization program which includes automated exposure control, adjustment of the mA and/or kV according to patient size and/or use of iterative reconstruction technique. CONTRAST:  OMNIPAQUE IOHEXOL 300 MG/ML  SOLN COMPARISON:  Study done earlier today FINDINGS: Lower chest: There are small linear densities in the lower lung fields, more so on the left side suggesting subsegmental atelectasis. Scattered coronary artery calcifications are seen. Hepatobiliary: There is fatty infiltration in liver. There is 1.7 cm cyst in the left lobe. There is no dilation of bile ducts. Gallbladder is unremarkable. Pancreas: No focal abnormalities are seen. Spleen: Spleen is not enlarged. There is  subcentimeter low-density, possibly a cyst or hemangioma. Adrenals/Urinary Tract: Adrenals are unremarkable. There is 7 mm calculus at the left ureteropelvic junction causing moderate left hydronephrosis. There are few bilateral renal stones, more so in the right kidney. There are 2 stones in the right renal pelvis measuring up to 8 mm. There is no hydronephrosis in the right kidney. Urinary bladder is not distended. There are few left renal cysts measuring up to 2.4 cm. Stomach/Bowel: Stomach is unremarkable. Small bowel loops are not dilated. Appendix is not seen. Diverticula seen in colon. There is wall thickening and pericolic stranding in sigmoid colon. There is 4 x 2.2 cm loculated fluid collection in the upper margin of sigmoid colon. There is another 2 x 1.2 cm fluid collection in the inferior margin of sigmoid. There is a linear loculated fluid collection in left side of pelvis measuring 8.6 x 2.1 cm with interval increase in size. Small amount of free fluid is seen in adjacent to the rectosigmoid with interval increase. Vascular/Lymphatic: Scattered arterial calcifications are seen. Reproductive: Unremarkable. Other: There is no ascites or pneumoperitoneum. There is  a stranding in subcutaneous plane in the suprapubic region, possibly residual from previous surgery. Small umbilical hernia containing fat is seen. Musculoskeletal: No acute findings are seen. IMPRESSION: Diverticulosis of colon. There is focal wall thickening along with pericolic stranding in sigmoid colon suggesting acute diverticulitis. There are 2 loculated fluid collections each measuring less than 4 cm in size immediately adjacent to the sigmoid colon suggesting possible pericolic abscesses. There is a linear 8.6 x 2.1 cm loculated fluid collection in left side of pelvis medial to the iliac vessels with interval increase suggesting loculated serous fluid collection or another developing abscess related to acute diverticulitis. Small  amount of free fluid is seen in pelvis. There is 7 mm calculus at the left ureteropelvic junction causing moderate left hydronephrosis. Bilateral renal stones. There is no evidence of intestinal obstruction or pneumoperitoneum. Fatty liver. Linear densities in left lower lung fields suggest subsegmental atelectasis. Other findings as described in the body of the report. Electronically Signed   By: Ernie Avena M.D.   On: 04/27/2022 16:04      LOS: 2 days    Jacquelin Hawking, MD [facility] 04/29/2022, 10:32 AM   If 7PM-7AM, please contact night-coverage [email or web address]

## 2022-04-29 NOTE — Progress Notes (Signed)
[facility] Progress Note  2 Days Post-Op  Subjective: CC-  Still bit irritated from the stents, having a little bit of lower abdominal pain but this is very mild.  Objective: Vital signs in last 24 hours: Temp:  [97.6 F (36.4 C)-98.6 F (37 C)] 97.6 F (36.4 C) (11/10 0526) Pulse Rate:  [56-75] 72 (11/10 0526) Resp:  [16-22] 17 (11/10 0526) BP: (126-167)/(59-91) 133/61 (11/10 0526) SpO2:  [94 %-100 %] 94 % (11/10 0526) Last BM Date : 04/26/22  Intake/Output from previous day: 11/09 0701 - 11/10 0700 In: -  Out: 675 [Urine:675] Intake/Output this shift: Total I/O In: 240 [P.O.:240] Out: 100 [Urine:100]  PE: Gen:  Alert, NAD, pleasant Abd: soft, ND, +BS, lap and midline incisions well healed.  Minimally tender in the left lower quadrant and suprapubic region  Lab Results:  Recent Labs    04/28/22 0431 04/29/22 0427  WBC 16.9* 17.6*  HGB 10.3* 9.4*  HCT 33.3* 31.0*  PLT 484* 481*    BMET Recent Labs    04/28/22 0431 04/29/22 0427  NA 137 139  K 4.2 3.7  CL 106 109  CO2 22 23  GLUCOSE 203* 158*  BUN 19 22  CREATININE 0.96 0.95  CALCIUM 9.4 9.1    PT/INR Recent Labs    04/28/22 0431  LABPROT 15.1  INR 1.2    CMP     Component Value Date/Time   NA 139 04/29/2022 0427   K 3.7 04/29/2022 0427   CL 109 04/29/2022 0427   CO2 23 04/29/2022 0427   GLUCOSE 158 (H) 04/29/2022 0427   BUN 22 04/29/2022 0427   CREATININE 0.95 04/29/2022 0427   CALCIUM 9.1 04/29/2022 0427   PROT 7.3 04/28/2022 0431   ALBUMIN 3.0 (L) 04/28/2022 0431   AST 17 04/28/2022 0431   ALT 18 04/28/2022 0431   ALKPHOS 77 04/28/2022 0431   BILITOT 0.6 04/28/2022 0431   GFRNONAA >60 04/29/2022 0427   Lipase     Component Value Date/Time   LIPASE 26 04/27/2022 0753       Studies/Results: CT PELVIS LIMITED WO CONTRAST  Result Date: 04/28/2022 CLINICAL DATA:  Pelvic abscess.  Abscess drain placement EXAM: CT PELVIS WITHOUT CONTRAST TECHNIQUE:  Multidetector CT imaging of the pelvis was performed following the standard protocol without intravenous contrast. RADIATION DOSE REDUCTION: This exam was performed according to the departmental dose-optimization program which includes automated exposure control, adjustment of the mA and/or kV according to patient size and/or use of iterative reconstruction technique. COMPARISON:  CT AP, 05/07/2022. FINDINGS: Informed written consent was obtained from the patient and/or patient's representative after a discussion of the risks, benefits and alternatives to treatment. The patient was placed prone on the CT gantry and a pre procedural CT was performed. A timeout was performed prior to the initiation of the procedure. Urinary Tract: Decompressed urinary bladder. Bilateral retrograde ureteral stents with pigtails coiled within the bladder. Bowel: Imaged portions of the small bowel and colon are nonobstructed. Sigmoid diverticulitis, best appreciated on recent comparison contrasted CT AP. Vascular/Lymphatic: No pathologically enlarged lymph nodes. No significant vascular abnormality seen. Reproductive:  Normal appearance of the uterus and adnexa. Other: Deep pelvic abscess, measuring proximally 3.5 x 2.5 cm (AP by transaxial) with intervening viscus in prone position. Musculoskeletal: Obese.  No interval osseous abnormality. IMPRESSION: Aborted CT-guided drain placement of pelvic abscess. Small, deep pelvic abscess with intervening viscus in prone position. No safe window for percutaneous access. Roanna Banning, MD [facility] Electronically Signed   By: Roanna Banning M.D.   On: 04/28/2022 16:46   DG C-Arm 1-60 Min-No Report  Result Date: 04/27/2022 Fluoroscopy was utilized by the requesting physician.  No radiographic interpretation.   CT ABDOMEN PELVIS W CONTRAST  Result Date: 04/27/2022 CLINICAL DATA:  Left lower quadrant abdominal pain EXAM: CT ABDOMEN AND  PELVIS WITH CONTRAST TECHNIQUE: Multidetector CT imaging of the abdomen and pelvis was performed using the standard protocol following bolus administration of intravenous contrast. RADIATION DOSE REDUCTION: This exam was performed according to the departmental dose-optimization program which includes automated exposure control, adjustment of the mA and/or kV according to patient size and/or use of iterative reconstruction technique. CONTRAST:  OMNIPAQUE IOHEXOL 300 MG/ML  SOLN COMPARISON:  Study done earlier today FINDINGS: Lower chest: There are small linear densities in the lower lung fields, more so on the left side suggesting subsegmental atelectasis. Scattered coronary artery calcifications are seen. Hepatobiliary: There is fatty infiltration in liver. There is 1.7 cm cyst in the left lobe. There is no dilation of bile ducts. Gallbladder is unremarkable. Pancreas: No focal abnormalities are seen. Spleen: Spleen is not enlarged. There is subcentimeter low-density, possibly a cyst or hemangioma. Adrenals/Urinary Tract: Adrenals are unremarkable. There is 7 mm calculus at the left ureteropelvic junction causing moderate left hydronephrosis. There are few bilateral renal stones, more so in the right kidney. There are 2 stones in the right renal pelvis measuring up to 8 mm. There is no hydronephrosis in the right kidney. Urinary bladder is not distended. There are few left renal cysts measuring up to 2.4 cm. Stomach/Bowel: Stomach is unremarkable. Small bowel loops are not dilated. Appendix is not seen. Diverticula seen in colon. There is wall thickening and pericolic stranding in sigmoid colon. There is 4 x 2.2 cm loculated fluid collection in the upper margin of sigmoid colon. There is another 2 x 1.2 cm fluid collection in the inferior margin of sigmoid. There is a linear loculated fluid collection in left side of pelvis measuring 8.6 x 2.1 cm with interval increase in size. Small amount of free fluid is  seen in adjacent to the rectosigmoid with interval increase. Vascular/Lymphatic: Scattered arterial calcifications are seen. Reproductive: Unremarkable. Other: There is no ascites or pneumoperitoneum. There is a stranding in subcutaneous plane in the suprapubic region, possibly residual from previous surgery. Small umbilical hernia containing fat is seen. Musculoskeletal: No acute findings are seen. IMPRESSION: Diverticulosis of colon. There is focal wall thickening along with pericolic stranding in sigmoid colon suggesting acute diverticulitis. There are 2 loculated fluid collections each measuring less than 4 cm in size immediately adjacent to the sigmoid colon suggesting possible pericolic abscesses. There is a linear 8.6 x 2.1 cm loculated fluid collection in left side of pelvis medial to the iliac vessels with interval increase suggesting loculated serous fluid collection or another developing abscess related to acute diverticulitis. Small amount of free fluid is seen in pelvis. There is 7 mm calculus at the left ureteropelvic junction causing moderate left hydronephrosis. Bilateral renal stones. There is no evidence of intestinal obstruction or pneumoperitoneum. Fatty liver. Linear densities in left lower lung fields suggest subsegmental atelectasis. Other findings as described in the body of the report. Electronically Signed   By: Ernie Avena M.D.   On: 04/27/2022 16:04    Anti-infectives: Anti-infectives (From admission, onward)    Start     Dose/Rate Route Frequency Ordered Stop   04/27/22 1800  piperacillin-tazobactam (ZOSYN)  IVPB 3.375 g        3.375 g 12.5 mL/hr over 240 Minutes Intravenous Every 8 hours 04/27/22 1452     04/27/22 1200  piperacillin-tazobactam (ZOSYN) IVPB 3.375 g        3.375 g 100 mL/hr over 30 Minutes Intravenous  Once 04/27/22 1157 04/27/22 1309        Assessment/Plan  Sigmoid diverticulitis with multiple abscesses - 1st bout, never had a colonoscopy - CT  11/8 shows acute sigmoid diverticulitis with 2 loculated fluid collections each measuring less than 4 cm in size immediately adjacent to the sigmoid colon suggesting possible pericolic abscesses; there is a linear 8.6 x 2.1 cm loculated fluid collection in left side of pelvis medial to the iliac vessels with interval increase suggesting loculated serous fluid collection or another developing abscess; no pneumoperitoneum -Not amenable to IR drainage 11/9 -We will continue bowel rest (clear liquids okay) and IV antibiotics, continue to follow WBC.  She is afebrile and has a fairly benign exam at this point.  Anticipate repeat imaging early next week to assess for drain ability versus progression. Ultimately if this resolves with conservative measures patient will need outpatient GI follow up for colonoscopy in 6-8 weeks.  ID - zosyn 11/8 FEN - IVF, NPO VTE - ok for chemical dvt ppx after IR procedure from our standpoint Foley - none  R renal calculi, L UPJ stone with hydronephrosis, UTI - s/p bilateral ureteral stent placement 11/8, urology following HTN Obesity BMI 40.50  I reviewed Consultant interventional radiology notes, hospitalist notes, last 24 h vitals and pain scores, last 48 h intake and output, last 24 h labs and trends, and last 24 h imaging results.    LOS: 2 days    Berna Bue, MD [facility] 04/29/2022, 11:46 AM Please see Amion for pager number during day hours 7:00am-4:30pm

## 2022-04-29 NOTE — Progress Notes (Signed)
Initial Nutrition Assessment  DOCUMENTATION CODES:   Obesity unspecified  INTERVENTION:   -Placed "Low fiber", "High Fiber" "Plate Method" and "Carbohydrate Counting" handouts in AVS -Reviewed diet principles with patient  -Will monitor for diet advancement  NUTRITION DIAGNOSIS:   Inadequate oral intake related to acute illness (diverticulitis) as evidenced by  (clear liquid diet).  GOAL:   Patient will meet greater than or equal to 90% of their needs  MONITOR:   PO intake, Weight trends, Labs, I & O's  REASON FOR ASSESSMENT:   Consult Assessment of nutrition requirement/status, Diet education  ASSESSMENT:   71 y.o. female with a history of hypertension who presented secondary to abdominal and flank pain and found to have a left obstructive UPJ stone and diverticulitis with abscess. Urology and General surgery consulted. Patient underwent bilateral ureteral stent placement on 11/8.  Patient in room with husband at bedside. Pt currently on clears. Pt states she doesn't feel ready for diet advancement and is not taking in much at this time. Had some juice this morning. Did take in some broth, an icee and ice tea last night. Pt reports feeling distressed that her HgbA1c is 6.5%. Pt reports she helped her first husband with his diabetes until he died from complications from the disease. She aspires to manage it with lifestyle changes only, does not want to be on medications. Reviewed principles of diabetes diet and encouraged pt to not only adjust diet but also include some form of physical activity and stress management. Pt states she has been increasingly stressed caring for her father who is advanced age and lives alone.  We also discussed the diet progression for diverticulitis.  Will provide low and high fiber handouts as well as Carbohhydrate counting handout for DM.  Medications reviewed.  Labs reviewed: Low iron   NUTRITION - FOCUSED PHYSICAL EXAM:  No depletions  noted.  Diet Order:   Diet Order             Diet clear liquid Room service appropriate? Yes; Fluid consistency: Thin  Diet effective now                   EDUCATION NEEDS:   Education needs have been addressed  Skin:  Skin Assessment: Reviewed RN Assessment  Last BM:  11/7  Height:   Ht Readings from Last 1 Encounters:  04/27/22 5' 2.5" (1.588 m)    Weight:   Wt Readings from Last 1 Encounters:  04/27/22 102.1 kg    BMI:  Body mass index is 40.5 kg/m.  Estimated Nutritional Needs:   Kcal:  1500-1700  Protein:  75-90g  Fluid:  1.7L/day    Tilda Franco, MS, RD, LDN Inpatient Clinical Dietitian Contact information available via Amion

## 2022-04-29 NOTE — Discharge Instructions (Signed)
Low Fiber Nutrition Therapy   You may need a low-fiber diet if you have Crohn's disease, diverticulitis, gastroparesis, ulcerative colitis, a new colostomy, or new ileostomy. A low-fiber diet may also be needed following radiation therapy to the pelvis and lower bowel or recent intestinal surgery.  A low-fiber diet reduces the frequency and volume of your stools. This lessens irritation to the gastrointestinal (GI) tract and can help you heal. Use this diet if you have a stricture so your intestine doesn't get blocked. The goal of this diet is to get less than 8 grams of fiber daily. It's also important to eat enough protein foods while you are on a low-fiber diet.  Drink nutrition supplements that have 1 gram of fiber or less in each serving. If your stricture is severe or if your inflammation is severe, drink more liquids to reduce symptoms and to get enough calories and protein.  Tips Eat about 5 to 6 small meals daily or about every 3 to 4 hours. Do not skip meals.  Every time you eat, include a small amount of protein (1 to 2 ounces) plus an additional food. Low fiber starch foods are the best choice to eat with protein.  Limit acidic, spicy and high-fat or fried and greasy foods to reduce GI symptoms.  Do not eat raw fruits and vegetables while on this diet. All fruits and vegetables need to be cooked and without peels or skins.  Drink a lot of fluids, at least 8 cups of fluid each day. Limit drinks with caffeine, sugar, and sugar substitutes.  Plain water is the best choice. Avoid mixing drink packets or flavor drops into water. .  Take a chewable multivitamin with minerals. Gummy vitamins do not have enough minerals and can block an ostomy and non-chewable supplements are not easily digested. Chewable supplements must be used if you have a stricture or ostomy.  If you are lactose intolerant, you may need to eat low-lactose dairy products. If you can't tolerate dairy, ask your RDN about how  you can get enough calcium from other foods.  Do not take a calcium supplement. They can cause a blockage.  It is important to add high-calcium foods gradually to your diet and monitor for symptoms to avoid a blockage.  Do not add more fiber to your diet until your health care provider or registered dietitian nutritionist (RDN) tells you it's OK. Fiber is part of whole grains, fruits and vegetables (foods from plants) and needs to be slowly added back in to your diet when your body is healed.  Choose foods that have been safely handled and prepared to lower your risk of foodborne illness. Talk to your RDN or see the Food Safety Nutrition Therapy handout for more information.   Foods Recommended These foods are low in fat and fiber and will help with your GI symptoms. Food Group Foods Recommended  Grains  Choose grain foods with less than 2 grams of fiber per serving. Refined white flour products--for example, enriched white bread without seeds, crackers or pasta Cream of wheat or rice Grits (fine ground) Tortillas: white flour or corn White rice, well-cooked (do not rinse, or soak before cooking) Cold and hot cereals made from white or refined flour such as puffed rice or corn flakes  Protein Foods  Lean, very tender, well-cooked poultry or fish; red meats: beef, pork or lamb (slow cook until soft; chop meats if you have stricture or ostomy) Eggs, well-cooked Smooth nut butters such as almond,  peanut, or sunflower Tofu  Dairy  If you have lactose intolerance, drinking milk products from cows or goats may make diarrhea worse. Foods marked with an asterisk (*) have lactose. Milk: fat-free, 1% or 2% * (choose best tolerated) Lactose-free milk Buttermilk* Fortified non-dairy milks: almond, cashew, coconut, or rice (be aware that these options are not good sources of protein so you will need to eat an additional protein food) Kefir* (Don't include kefir in the diet until approved by your health  care provider) Yogurt*/lactose-free yogurt (without nuts, fruit, granola or chocolate) Mild cheese* (hard and aged cheeses tend to be lower in lactose such as cheddar, swiss or parmesan) Cottage cheese* or lactose-free cottage cheese Low-fat ice cream* or lactose-free ice cream Sherbet* (usually lower lactose)  Vegetables  Canned and well-cooked vegetables without seeds, skins, or hulls  Carrots or green beans, cooked White, red or yellow potatoes without skins Strained vegetable juice  Fruit Soft, and well-cooked fruits without skins, seeds, or membranes Canned fruit in juice: peaches, pears, or applesauce Fruit juice without pulp diluted by half with water may be tolerated better Fruit drinks fortified with vitamin C may be tolerated better than 100% fruit juice  Oils  When possible, choose healthy oils and fats, such as olive and canola oils, plant oils rather than solid fats.  Other  Broth and strained soups made from allowed foods Desserts (small portions) without whole grains, seeds, nuts, raisins, or coconut Jelly (clear)   Foods Not Recommended These foods are higher in fat and fiber and may make your GI symptoms worse.  Food Group Foods Not Recommended  Grains  Bread, whole wheat or with whole grain flour or seeds or nuts Brown rice, quinoa, kasha, barley Tortillas: whole grain Whole wheat pasta Whole grain and high-fiber cereals, including oatmeal, bran flakes or shredded wheat Popcorn  Protein Foods  Steak, pork chops, or other meats that are fatty or have gristle Fried meat, poultry, or fish Seafood with a tough or rubbery texture, such as shrimp Luncheon meats such as bologna and salami Sausage, bacon, or hot dogs Dried beans, peas, or lentils Hummus Sushi Nuts and chunky nut butters  Dairy  Whole milk Pea milk and soymilk (may cause diarrhea, gas, bloating, and abdominal pain) Cream Half-and-half Sour cream Yogurt with added fruit, nuts, or granola or chocolate   Vegetables  Alfalfa or bean sprouts (high fiber and risk for bacteria) Raw or undercooked vegetables: beets; broccoli; brussels sprouts; cabbage; cauliflower; collard, mustard, or turnip greens; corn; cucumber; green peas or any kind of peas; kale; lima beans; mushrooms; okra; olives; pickles and relish; onions; parsnips; peppers; potato skins; sauerkraut; spinach; tomatoes  Fruit Raw fruit Dried fruit Avocado, berries, coconut Canned fruit in syrup Canned fruit with mandarin oranges, papaya or pineapple Fruit juice with pulp Prune juice Fruit skin  Oils  Pork rinds   Low-Fiber (8 grams) Sample 1-Day Menu  Breakfast  cup cream of wheat (0.5 gram fiber)  1 slice white toast (1 gram fiber)  1 teaspoon margarine, soft tub  2 scrambled eggs   Morning Snack 1 cup lactose-free nutrition supplement  Lunch 2 slices white bread (2 grams fiber)  3 tablespoons tuna  1 tablespoon mayonnaise  1 cup chicken noodle soup (1 gram fiber)   cup apple juice   Afternoon Snack 6 saltine crackers (0.5 gram fiber)  2 ounces low-fat cheddar cheese  Evening Meal 3 ounces tender chicken breast  1 cup white rice (0.5 gram fiber)   cup  cooked canned green beans (2 grams fiber)   cup cranberry juice   Evening Snack 1 cup lactose-free nutrition supplement   Copyright 2020  [employer]  High Fiber Nutrition Therapy  Fiber and fluid may help you feel less constipated and bloated and can also help ease diarrhea. Increase fiber slowly over the course of a few weeks. This will keep your symptoms from getting worse. Tips Tips for Adding Fiber to Your Eating Plan Slowly increase the amount of fiber you eat to 25 to 35 grams per day. Eat whole grain breads and cereals. Look for choices with 100% whole wheat, rye, oats, or bran as the first or second ingredient. Have brown or wild rice instead of white rice or potatoes. Enjoy a variety of grains. Good choices include barley, oats,  farro, kamut, and quinoa. Bake with whole wheat flour. You can use it to replace some white or all-purpose flour in recipes. Enjoy baked beans more often! Add dried beans and peas to casseroles or soups. Choose fresh fruit and vegetables instead of juices. Eat fruits and vegetables with peels or skins on. Compare food labels of similar foods to find higher fiber choices. On packaged foods, the amount of fiber per serving is listed on the Nutrition Facts label. Check the Nutrition Facts labels and try to choose products with at least 4 g dietary fiber per serving. Drink plenty of fluids. Set a goal of at least 8 cups per day. You may need even more fluid as you eat higher amounts of fiber. Fluid helps your body process fiber without discomfort. Foods Recommended Foods With at Least 4 g Fiber per Serving Food Group Choose  Grains ?- cup high-fiber cereal  Dried beans and peas  cup cooked red beans, kidney beans, large lima beans, navy beans, pinto beans, white beans, lentils, or black-eyed peas  Vegetables 1 artichoke (cooked)  Fruits  cup blackberries or raspberries 4 dried prunes   Foods With 1 to 3 g Fiber per Serving Food Group Choose  Grains 1 bagel (8.4-ZYSA diameter) 1 slice whole wheat, cracked wheat, pumpernickel, or rye bread 2-inch square cornbread 4 whole wheat crackers 1 bran, blueberry, cornmeal, or English muffin  cup cereal with 1-3 g fiber per serving (check dietary fiber on the product's Nutrition Facts label) 2 tablespoons wheat germ or whole wheat flour  Fruits 1 apple (3-inch diameter) or  cup applesauce  cup apricots (canned) 1 banana  cup cherries (canned or fresh)  cup cranberries (fresh) 3 dates 2 medium figs (fresh)  cup fruit cocktail (canned)  grapefruit 1 kiwi fruit 1 orange (2-inch diameter) 1 peach (fresh) or  cup peaches (canned) 1 pear (fresh) or  cup pears (canned) 1 plum (2-inch diameter)  cup raisins  cup strawberries  (fresh) 1 tangerine  Vegetables  cup bean sprouts (raw)  cup beets (diced, canned)  cup broccoli, brussels sprouts, or cabbage  (cooked)  cup carrots  cup cauliflower  cup corn  cup eggplant  cup okra (boiled)  cup potatoes (baked or mashed)  cup spinach, kale, or turnip greens (cooked)  cup squash--winter, summer, or zucchini (cooked)  cup sweet potatoes or yams  cup tomatoes (canned)  Other 2 tablespoons almonds or peanuts 1 cup popcorn (popped)   High Fiber Vegetarian (Lacto-Ovo) Sample 1-Day Menu  Breakfast  cup bran cereal  1 banana  cup blueberries 1 cup 1% milk  Lunch 2 slices whole wheat bread  2 tablespoons hummus 1 ounce cheddar cheese  1 leaf lettuce 2 slices tomato  cup vegetarian baked beans 1 orange 1 cup 1% milk  Evening Meal Stir fry made with:  cup tempeh   cup brown rice 1 cup frozen broccoli 1 tablespoon soy sauce  cup peanuts  1 pear  Evening Snack 6 ounces fruit yogurt  1 cup air popped popcorn  High Fiber Vegan Sample 1-Day Menu  Breakfast  cup bran cereal  1 banana  cup blueberries 1 cup soymilk fortified with calcium, vitamin B12, and vitamin D  Lunch  cup chili with beans with:   cup tempeh crumbles  cup crushed whole wheat crackers 1 apple 1 cup soymilk fortified with calcium, vitamin B12, and vitamin D  Evening Meal 1 veggie burger  1 whole wheat bun  1 leaf lettuce  1 slice tomato Salad made with: 1 cup lettuce  cup chickpeas   cucumbers 1 tablespoon italian dressing 1 cup strawberries   Evening Snack  cup almonds  1 cup carrot sticks  High Fiber Sample 1-Day Menu  Breakfast 1/2 cup orange juice, with pulp  1/2 cup raisin bran 1 cup fat-free milk 1 cup coffee  Morning Snack 1 cup plain yogurt  2 cups water  Lunch 1 1/2 cups chili  1/2 cup kidney beans 1/2 cup soy crumble 2 tablespoons shredded cheese 8 whole wheat crackers 1 apple (with skin)   Evening Meal 2 ounces sliced chicken  1/4 cup  tofu 2 cups mixed fresh vegetables 1 cup brown rice 1/2 cup strawberries 1 cup hot tea  Evening Snack 2 tablespoons almonds  1 cup hot chocolate   Copyright 2020  Academy [employer]   Carbohydrate Counting For People With Diabetes  Foods with carbohydrates make your blood glucose level go up. Learning how to count carbohydrates can help you control your blood glucose levels. First, identify the foods you eat that contain carbohydrates. Then, using the Foods with Carbohydrates chart, determine about how much carbohydrates are in your meals and snacks. Make sure you are eating foods with fiber, protein, and healthy fat along with your carbohydrate foods. Foods with Carbohydrates The following table shows carbohydrate foods that have about 15 grams of carbohydrate each. Using measuring cups, spoons, or a food scale when you first begin learning about carbohydrate counting can help you learn about the portion sizes you typically eat. The following foods have 15 grams carbohydrate each:  Grains 1 slice bread (1 ounce)  1 small tortilla (6-inch size)   large bagel (1 ounce)  1/3 cup pasta or rice (cooked)   hamburger or hot dog bun ( ounce)   cup cooked cereal   to  cup ready-to-eat cereal  2 taco shells (5-inch size) Fruit 1 small fresh fruit ( to 1 cup)   medium banana  17 small grapes (3 ounces)  1 cup melon or berries   cup canned or frozen fruit  2 tablespoons dried fruit (blueberries, cherries, cranberries, raisins)   cup unsweetened fruit juice  Starchy Vegetables  cup cooked beans, peas, corn, potatoes/sweet potatoes   large baked potato (3 ounces)  1 cup acorn or butternut squash  Snack Foods 3 to 6 crackers  8 potato chips or 13 tortilla chips ( ounce to 1 ounce)  3 cups popped popcorn  Dairy 3/4 cup (6 ounces) nonfat plain yogurt, or yogurt with sugar-free sweetener  1 cup milk  1 cup plain rice, soy, coconut or flavored almond milk Sweets  and Desserts  cup ice cream  or frozen yogurt  1 tablespoon jam, jelly, pancake syrup, table sugar, or honey  2 tablespoons light pancake syrup  1 inch square of frosted cake or 2 inch square of unfrosted cake  2 small cookies (2/3 ounce each) or  large cookie  Sometimes you'll have to estimate carbohydrate amounts if you don't know the exact recipe. One cup of mixed foods like soups can have 1 to 2 carbohydrate servings, while some casseroles might have 2 or more servings of carbohydrate. Foods that have less than 20 calories in each serving can be counted as "free" foods. Count 1 cup raw vegetables, or  cup cooked non-starchy vegetables as "free" foods. If you eat 3 or more servings at one meal, then count them as 1 carbohydrate serving.  Foods without Carbohydrates  Not all foods contain carbohydrates. Meat, some dairy, fats, non-starchy vegetables, and many beverages don't contain carbohydrate. So when you count carbohydrates, you can generally exclude chicken, pork, beef, fish, seafood, eggs, tofu, cheese, butter, sour cream, avocado, nuts, seeds, olives, mayonnaise, water, black coffee, unsweetened tea, and zero-calorie drinks. Vegetables with no or low carbohydrate include green beans, cauliflower, tomatoes, and onions. How much carbohydrate should I eat at each meal?  Carbohydrate counting can help you plan your meals and manage your weight. Following are some starting points for carbohydrate intake at each meal. Work with your registered dietitian nutritionist to find the best range that works for your blood glucose and weight.   To Lose Weight To Maintain Weight  Women 2 - 3 carb servings 3 - 4 carb servings  Men 3 - 4 carb servings 4 - 5 carb servings  Checking your blood glucose after meals will help you know if you need to adjust the timing, type, or number of carbohydrate servings in your meal plan. Achieve and keep a healthy body weight by balancing your food intake and physical  activity.  Tips How should I plan my meals?  Plan for half the food on your plate to include non-starchy vegetables, like salad greens, broccoli, or carrots. Try to eat 3 to 5 servings of non-starchy vegetables every day. Have a protein food at each meal. Protein foods include chicken, fish, meat, eggs, or beans (note that beans contain carbohydrate). These two food groups (non-starchy vegetables and proteins) are low in carbohydrate. If you fill up your plate with these foods, you will eat less carbohydrate but still fill up your stomach. Try to limit your carbohydrate portion to  of the plate.  What fats are healthiest to eat?  Diabetes increases risk for heart disease. To help protect your heart, eat more healthy fats, such as olive oil, nuts, and avocado. Eat less saturated fats like butter, cream, and high-fat meats, like bacon and sausage. Avoid trans fats, which are in all foods that list "partially hydrogenated oil" as an ingredient. What should I drink?  Choose drinks that are not sweetened with sugar. The healthiest choices are water, carbonated or seltzer waters, and tea and coffee without added sugars.  Sweet drinks will make your blood glucose go up very quickly. One serving of soda or energy drink is  cup. It is best to drink these beverages only if your blood glucose is low.  Artificially sweetened, or diet drinks, typically do not increase your blood glucose if they have zero calories in them. Read labels of beverages, as some diet drinks do have carbohydrate and will raise your blood glucose. Label Reading Tips Read Nutrition Facts labels  to find out how many grams of carbohydrate are in a food you want to eat. Don't forget: sometimes serving sizes on the label aren't the same as how much food you are going to eat, so you may need to calculate how much carbohydrate is in the food you are serving yourself.   Carbohydrate Counting for People with Diabetes Sample 1-Day Menu  Breakfast   cup yogurt, low fat, low sugar (1 carbohydrate serving)   cup cereal, ready-to-eat, unsweetened (1 carbohydrate serving)  1 cup strawberries (1 carbohydrate serving)   cup almonds ( carbohydrate serving)  Lunch 1, 5 ounce can chunk light tuna  2 ounces cheese, low fat cheddar  6 whole wheat crackers (1 carbohydrate serving)  1 small apple (1 carbohydrate servings)   cup carrots ( carbohydrate serving)   cup snap peas  1 cup 1% milk (1 carbohydrate serving)   Evening Meal Stir fry made with: 3 ounces chicken  1 cup brown rice (3 carbohydrate servings)   cup broccoli ( carbohydrate serving)   cup green beans   cup onions  1 tablespoon olive oil  2 tablespoons teriyaki sauce ( carbohydrate serving)  Evening Snack 1 extra small banana (1 carbohydrate serving)  1 tablespoon peanut butter   Carbohydrate Counting for People with Diabetes Vegan Sample 1-Day Menu  Breakfast 1 cup cooked oatmeal (2 carbohydrate servings)   cup blueberries (1 carbohydrate serving)  2 tablespoons flaxseeds  1 cup soymilk fortified with calcium and vitamin D  1 cup coffee  Lunch 2 slices whole wheat bread (2 carbohydrate servings)   cup baked tofu   cup lettuce  2 slices tomato  2 slices avocado   cup baby carrots ( carbohydrate serving)  1 orange (1 carbohydrate serving)  1 cup soymilk fortified with calcium and vitamin D   Evening Meal Burrito made with: 1 6-inch corn tortilla (1 carbohydrate serving)  1 cup refried vegetarian beans (2 carbohydrate servings)   cup chopped tomatoes   cup lettuce   cup salsa  1/3 cup brown rice (1 carbohydrate serving)  1 tablespoon olive oil for rice   cup zucchini   Evening Snack 6 small whole grain crackers (1 carbohydrate serving)  2 apricots ( carbohydrate serving)   cup unsalted peanuts ( carbohydrate serving)    Carbohydrate Counting for People with Diabetes Vegetarian (Lacto-Ovo) Sample 1-Day Menu  Breakfast 1 cup cooked oatmeal (2  carbohydrate servings)   cup blueberries (1 carbohydrate serving)  2 tablespoons flaxseeds  1 egg  1 cup 1% milk (1 carbohydrate serving)  1 cup coffee  Lunch 2 slices whole wheat bread (2 carbohydrate servings)  2 ounces low-fat cheese   cup lettuce  2 slices tomato  2 slices avocado   cup baby carrots ( carbohydrate serving)  1 orange (1 carbohydrate serving)  1 cup unsweetened tea  Evening Meal Burrito made with: 1 6-inch corn tortilla (1 carbohydrate serving)   cup refried vegetarian beans (1 carbohydrate serving)   cup tomatoes   cup lettuce   cup salsa  1/3 cup brown rice (1 carbohydrate serving)  1 tablespoon olive oil for rice   cup zucchini  1 cup 1% milk (1 carbohydrate serving)  Evening Snack 6 small whole grain crackers (1 carbohydrate serving)  2 apricots ( carbohydrate serving)   cup unsalted peanuts ( carbohydrate serving)    Copyright 2020  Academy of [employer]. All rights reserved.  Using Nutrition Labels: Carbohydrate  Serving Size  Look at the  serving size. All the information on the label is based on this portion. Servings Per Container  The number of servings contained in the package. Guidelines for Carbohydrate  Look at the total grams of carbohydrate in the serving size.  1 carbohydrate choice = 15 grams of carbohydrate. Range of Carbohydrate Grams Per Choice  Carbohydrate Grams/Choice Carbohydrate Choices  6-10   11-20 1  21-25 1  26-35 2  36-40 2  41-50 3  51-55 3  56-65 4  66-70 4  71-80 5    Copyright 2020  Academy of [employer]. All rights reserved.   Plate Method for Diabetes   Foods with carbohydrates make your blood glucose level go up. The plate method is a simple way to meal plan and control the amount of carbohydrate you eat.         Use the following guidance to build a healthy plate to control carbohydrates. Divide a 9-inch plate into 3 sections, and consider your beverage the  4th section of your meal: Food Group Examples of Foods/Beverages for This Section of your Meal  Section 1: Non-starchy vegetables Fill  of your plate to include non-starchy vegetables Asparagus, broccoli, brussels sprouts, cabbage, carrots, cauliflower, celery, cucumber, green beans, mushrooms, peppers, salad greens, tomatoes, or zucchini.  Section 2: Protein foods Fill  of your plate to include a lean protein Lean meat, poultry, fish, seafood, cheese, eggs, lean deli meat, tofu, beans, lentils, nuts or nut butters.  Section 3: Carbohydrate foods Fill  of your plate to include carbohydrate foods Whole grains, whole wheat bread, brown rice, whole grain pasta, polenta, corn tortillas, fruit, or starchy vegetables (potatoes, green peas, corn, beans, acorn squash, and butternut squash). One cup of milk also counts as a food that contains carbohydrate.  Section 4: Beverage Choose water or a low-calorie drink for your beverage. Unsweetened tea, coffee, or flavored/sparkling water without added sugar.  Image reprinted with permission from The American [employer].  Copyright 2022 by the American Diabetes Association.   Copyright 2022  [employer]y [employer]. All rights reserved

## 2022-04-29 NOTE — Progress Notes (Signed)
RT called to patient's room to assist with CPAP.  Patient has a hospital provided CPAP at bedside but wanted assistance with setting up her home CPAP machine.  Patient asked for distilled water and a stand to place the CPAP machine on.  RT explained that we don't have distilled water but can provide sterile water. Pt agreed.  RT moved the nightstand next to the bed for her to use to place her CPAP on and provided a bottle of sterile water. Patient's spouse set-up her machine.

## 2022-04-29 NOTE — Plan of Care (Signed)
Patient ambulating in hallway with husband.  Third ambulation since 1600.  Gas pain resolving with walking and pain pill earlier.  States stents continue to be uncomfortable.  Returns to bed.

## 2022-04-30 DIAGNOSIS — N139 Obstructive and reflux uropathy, unspecified: Secondary | ICD-10-CM | POA: Diagnosis not present

## 2022-04-30 DIAGNOSIS — N390 Urinary tract infection, site not specified: Secondary | ICD-10-CM | POA: Diagnosis not present

## 2022-04-30 DIAGNOSIS — I10 Essential (primary) hypertension: Secondary | ICD-10-CM | POA: Diagnosis not present

## 2022-04-30 DIAGNOSIS — K5720 Diverticulitis of large intestine with perforation and abscess without bleeding: Secondary | ICD-10-CM | POA: Diagnosis not present

## 2022-04-30 LAB — BASIC METABOLIC PANEL
Anion gap: 5 (ref 5–15)
BUN: 18 mg/dL (ref 8–23)
CO2: 24 mmol/L (ref 22–32)
Calcium: 8.9 mg/dL (ref 8.9–10.3)
Chloride: 109 mmol/L (ref 98–111)
Creatinine, Ser: 0.92 mg/dL (ref 0.44–1.00)
GFR, Estimated: >60 mL/min (ref >60)
Glucose, Bld: 138 mg/dL — ABNORMAL HIGH (ref 70–99)
Potassium: 3.6 mmol/L (ref 3.5–5.1)
Sodium: 138 mmol/L (ref 135–145)

## 2022-04-30 LAB — CBC
HCT: 31 % — ABNORMAL LOW (ref 36.0–46.0)
Hemoglobin: 9.2 g/dL — ABNORMAL LOW (ref 12.0–15.0)
MCH: 23.1 pg — ABNORMAL LOW (ref 26.0–34.0)
MCHC: 29.7 g/dL — ABNORMAL LOW (ref 30.0–36.0)
MCV: 77.7 fL — ABNORMAL LOW (ref 80.0–100.0)
Platelets: 463 10*3/uL — ABNORMAL HIGH (ref 150–400)
RBC: 3.99 MIL/uL (ref 3.87–5.11)
RDW: 16.7 % — ABNORMAL HIGH (ref 11.5–15.5)
WBC: 15.2 10*3/uL — ABNORMAL HIGH (ref 4.0–10.5)
nRBC: 0 % (ref 0.0–0.2)

## 2022-04-30 LAB — MAGNESIUM: Magnesium: 2.2 mg/dL (ref 1.7–2.4)

## 2022-04-30 MED ORDER — HYDROCODONE-ACETAMINOPHEN 5-325 MG PO TABS
1.0000 | ORAL_TABLET | Freq: Four times a day (QID) | ORAL | Status: DC | PRN
  Administered 2022-04-30 – 2022-05-05 (×16): 1 via ORAL
  Filled 2022-04-30 (×16): qty 1

## 2022-04-30 NOTE — Progress Notes (Signed)
PROGRESS NOTE    Heidi Peterson  JKD:[id number] DOB: 03-20-1951 DOA: 04/27/2022 PCP: [facility]   Brief Narrative: Heidi Peterson is a 71 y.o. female with a history of hypertension who presented secondary to abdominal and flank pain and found to have a left obstructive UPJ stone and diverticulitis with abscess. Urology and General surgery consulted. Patient underwent bilateral ureteral stent placement on 11/8. Attempt to place drain into diverticular abscess unsuccessful.   Assessment and Plan: * Diverticulitis of intestine with abscess Empiric Zosyn initiated. General surgery consulted with recommendation for IR drain. IR attempted but could not find safe window for aspiration/drain placement. Leukocytosis mostly stable. -Await further general surgery recommendations -Continue Zosyn IV  Complicated UTI (urinary tract infection) Associated obstructive uropathy with likely infected stone. Urine culture significant for pan-sensitive E. Coli. Patient empirically on Zosyn IV. -Continue Zosyn IV secondary to intraabdominal infection  Obstructive uropathy Secondary to left UPJ stone with associated bilateral non obstructing ureteral stones requiring bilateral ureteral stent placement by urology on 11/8. Creatinine stable.  Obesity, Class III, BMI 40-49.9 (morbid obesity) (HCC) Estimated body mass index is 40.5 kg/m as calculated from the following:   Height as of this encounter: 5' 2.5" (1.588 m).   Weight as of this encounter: 102.1 kg.  HTN (hypertension) -Continue metoprolol succinate  Hyperglycemia Hemoglobin A1C of 6.5%. Discussed with patient and she prefers lifestyle changes. Will allow follow-up with primary care physician for repeat hemoglobin A1C to confirm diagnosis of diabetes at that time.  Microcytic anemia Chronic. Stable. Iron panel with possible iron deficiency, although ferritin is normal. -Iron supplementation on  discharge    DVT prophylaxis: SCDs Code Status:   Code Status: Full Code Family Communication: Husband at bedside Disposition Plan: Discharge home pending specialist recommendations and transition to outpatient antibiotic regimen   Consultants:  General surgery Interventional radiology  Procedures:  11/8: Cystoscopy with bilateral retrograde pyelogram, bilateral ureteral stent placement   Antimicrobials: Zosyn IV    Subjective: Pain is worse at night, mostly from stents. Some mild LLQ pain.  Objective: BP (!) 155/68 (BP Location: Left Arm)   Pulse 72   Temp 98.4 F (36.9 C) (Oral)   Resp 18   Ht 5' 2.5" (1.588 m)   Wt 102.1 kg   SpO2 93%   BMI 40.50 kg/m   Examination:  General exam: Appears calm and comfortable Respiratory system: Clear to auscultation. Respiratory effort normal. Cardiovascular system: S1 & S2 heard, RRR. No murmurs, rubs, gallops or clicks. Gastrointestinal system: Abdomen is nondistended, soft and nontender. Normal bowel sounds heard. Central nervous system: Alert and oriented. No focal neurological deficits. Musculoskeletal: No edema. No calf tenderness Skin: No cyanosis. No rashes Psychiatry: Judgement and insight appear normal. Mood & affect appropriate.    Data Reviewed: I have personally reviewed following labs and imaging studies  CBC Lab Results  Component Value Date   WBC 15.2 (H) 04/30/2022   RBC 3.99 04/30/2022   HGB 9.2 (L) 04/30/2022   HCT 31.0 (L) 04/30/2022   MCV 77.7 (L) 04/30/2022   MCH 23.1 (L) 04/30/2022   PLT 463 (H) 04/30/2022   MCHC 29.7 (L) 04/30/2022   RDW 16.7 (H) 04/30/2022   LYMPHSABS 1.5 04/27/2022   MONOABS 0.6 04/27/2022   EOSABS 0.1 04/27/2022   BASOSABS 0.0 04/27/2022     Last metabolic panel Lab Results  Component Value Date   NA 138 04/30/2022   K 3.6 04/30/2022   CL 109 04/30/2022  CO2 24 04/30/2022   BUN 18 04/30/2022   CREATININE 0.92 04/30/2022   GLUCOSE 138 (H) 04/30/2022    GFRNONAA >60 04/30/2022   CALCIUM 8.9 04/30/2022   PROT 7.3 04/28/2022   ALBUMIN 3.0 (L) 04/28/2022   BILITOT 0.6 04/28/2022   ALKPHOS 77 04/28/2022   AST 17 04/28/2022   ALT 18 04/28/2022   ANIONGAP 5 04/30/2022    GFR: Estimated Creatinine Clearance: 64.3 mL/min (by C-G formula based on SCr of 0.92 mg/dL).  Recent Results (from the past 240 hour(s))  Urine Culture     Status: Abnormal   Collection Time: 04/27/22  7:53 AM   Specimen: Urine, Clean Catch  Result Value Ref Range Status   Specimen Description   Final    URINE, CLEAN CATCH Performed at [facility], [address] [place], [place] [address]    Special Requests   Final    NONE Performed at [facility], [address] [place], [place] [address]    Culture >=100,000 COLONIES/mL ESCHERICHIA COLI (A)  Final   Report Status 04/29/2022 FINAL  Final   Organism ID, Bacteria ESCHERICHIA COLI (A)  Final      Susceptibility   Escherichia coli - MIC*    AMPICILLIN 4 SENSITIVE Sensitive     CEFAZOLIN <=4 SENSITIVE Sensitive     CEFEPIME <=0.12 SENSITIVE Sensitive     CEFTRIAXONE <=0.25 SENSITIVE Sensitive     CIPROFLOXACIN <=0.25 SENSITIVE Sensitive     GENTAMICIN <=1 SENSITIVE Sensitive     IMIPENEM <=0.25 SENSITIVE Sensitive     NITROFURANTOIN <=16 SENSITIVE Sensitive     TRIMETH/SULFA <=20 SENSITIVE Sensitive     AMPICILLIN/SULBACTAM <=2 SENSITIVE Sensitive     PIP/TAZO <=4 SENSITIVE Sensitive     * >=100,000 COLONIES/mL ESCHERICHIA COLI  Culture, blood (Routine X 2) w Reflex to ID Panel     Status: None (Preliminary result)   Collection Time: 04/27/22  1:50 PM   Specimen: BLOOD  Result Value Ref Range Status   Specimen Description   Final    BLOOD RIGHT ANTECUBITAL Performed at [facility], [address] [place], [place] [address]    Special Requests   Final    BOTTLES DRAWN AEROBIC AND ANAEROBIC Blood Culture adequate volume Performed at [facility], [address], [place], [place] [address]    Culture   Final    NO GROWTH 3 DAYS Performed at [facility], [address] [place], [place] [address]    Report Status PENDING  Incomplete  Culture, blood (Routine X 2) w Reflex to ID Panel     Status: None (Preliminary result)   Collection Time: 04/27/22  9:10 PM   Specimen: BLOOD LEFT ARM  Result Value Ref Range Status   Specimen Description   Final    BLOOD LEFT ARM Performed at [facility], [address] [place], [place] [address]    Special Requests   Final    BOTTLES DRAWN AEROBIC AND ANAEROBIC Blood Culture adequate volume Performed at [facility], [address] [place], [place] [address]    Culture   Final    NO GROWTH 3 DAYS Performed at [facility], [address], [place], [place] [address]    Report Status PENDING  Incomplete      Radiology Studies: CT PELVIS LIMITED WO CONTRAST  Result Date: 04/28/2022 CLINICAL DATA:  Pelvic abscess.  Abscess drain placement EXAM:  CT PELVIS WITHOUT CONTRAST TECHNIQUE: Multidetector CT imaging of the pelvis was performed following the standard protocol without intravenous contrast. RADIATION DOSE REDUCTION: This exam was performed according to the departmental dose-optimization program which includes automated exposure control, adjustment of the mA and/or kV according to patient size and/or use of iterative reconstruction technique. COMPARISON:  CT AP, 05/07/2022. FINDINGS: Informed written consent was obtained from the patient and/or patient's representative after a discussion of the risks, benefits and alternatives to treatment. The patient was placed prone on the CT gantry and a pre procedural CT was performed. A timeout was performed prior to the initiation of the procedure. Urinary Tract: Decompressed urinary bladder. Bilateral retrograde ureteral stents with pigtails coiled within the bladder. Bowel:  Imaged portions of the small bowel and colon are nonobstructed. Sigmoid diverticulitis, best appreciated on recent comparison contrasted CT AP. Vascular/Lymphatic: No pathologically enlarged lymph nodes. No significant vascular abnormality seen. Reproductive:  Normal appearance of the uterus and adnexa. Other: Deep pelvic abscess, measuring proximally 3.5 x 2.5 cm (AP by transaxial) with intervening viscus in prone position. Musculoskeletal: Obese.  No interval osseous abnormality. IMPRESSION: Aborted CT-guided drain placement of pelvic abscess. Small, deep pelvic abscess with intervening viscus in prone position. No safe window for percutaneous access. Roanna Banning, MD [facility] Radiology Electronically Signed   By: Roanna Banning M.D.   On: 04/28/2022 16:46      LOS: 3 days    Jacquelin Hawking, MD [facility] 04/30/2022, 9:48 AM   If 7PM-7AM, please contact night-coverage [email or web address]

## 2022-04-30 NOTE — Plan of Care (Signed)
Patient sleeping with cpap on.  Pain controlled with medication, lower abdomen continues to be painful.iv intact and infusing.

## 2022-04-30 NOTE — Progress Notes (Signed)
3 Days Post-Op   Subjective/Chief Complaint: Pt states pain is controlled  Nurse says patient with more pain? Pain scores 5 or less last 18 hours or so down from 9  No BM passing flatus    Objective: Vital signs in last 24 hours: Temp:  [98.2 F (36.8 C)-99.3 F (37.4 C)] 98.4 F (36.9 C) (11/11 0602) Pulse Rate:  [72-93] 72 (11/11 0602) Resp:  [18-20] 18 (11/11 0602) BP: (143-166)/(60-72) 155/68 (11/11 0602) SpO2:  [90 %-94 %] 93 % (11/11 0602) Last BM Date : 04/26/22  Intake/Output from previous day: 11/10 0701 - 11/11 0700 In: 1081.3 [P.O.:600; I.V.:231.3; IV Piggyback:250] Out: 1050 [Urine:1050] Intake/Output this shift: Total I/O In: -  Out: 100 [Urine:100]   Gen:  Alert, NAD, pleasant Abd: soft, ND, +BS, lap and midline incisions well healed.  Minimally tender in the left lower quadrant and suprapubic region    Lab Results:  Recent Labs    04/29/22 0427 04/30/22 0543  WBC 17.6* 15.2*  HGB 9.4* 9.2*  HCT 31.0* 31.0*  PLT 481* 463*   BMET Recent Labs    04/29/22 0427 04/30/22 0543  NA 139 138  K 3.7 3.6  CL 109 109  CO2 23 24  GLUCOSE 158* 138*  BUN 22 18  CREATININE 0.95 0.92  CALCIUM 9.1 8.9   PT/INR Recent Labs    04/28/22 0431  LABPROT 15.1  INR 1.2   ABG No results for input(s): "PHART", "HCO3" in the last 72 hours.  Invalid input(s): "PCO2", "PO2"  Studies/Results: CT PELVIS LIMITED WO CONTRAST  Result Date: 04/28/2022 CLINICAL DATA:  Pelvic abscess.  Abscess drain placement EXAM: CT PELVIS WITHOUT CONTRAST TECHNIQUE: Multidetector CT imaging of the pelvis was performed following the standard protocol without intravenous contrast. RADIATION DOSE REDUCTION: This exam was performed according to the departmental dose-optimization program which includes automated exposure control, adjustment of the mA and/or kV according to patient size and/or use of iterative reconstruction technique. COMPARISON:  CT AP, 05/07/2022. FINDINGS: Informed  written consent was obtained from the patient and/or patient's representative after a discussion of the risks, benefits and alternatives to treatment. The patient was placed prone on the CT gantry and a pre procedural CT was performed. A timeout was performed prior to the initiation of the procedure. Urinary Tract: Decompressed urinary bladder. Bilateral retrograde ureteral stents with pigtails coiled within the bladder. Bowel: Imaged portions of the small bowel and colon are nonobstructed. Sigmoid diverticulitis, best appreciated on recent comparison contrasted CT AP. Vascular/Lymphatic: No pathologically enlarged lymph nodes. No significant vascular abnormality seen. Reproductive:  Normal appearance of the uterus and adnexa. Other: Deep pelvic abscess, measuring proximally 3.5 x 2.5 cm (AP by transaxial) with intervening viscus in prone position. Musculoskeletal: Obese.  No interval osseous abnormality. IMPRESSION: Aborted CT-guided drain placement of pelvic abscess. Small, deep pelvic abscess with intervening viscus in prone position. No safe window for percutaneous access. Heidi Banning, MD [facility] Radiology Electronically Signed   By: Heidi Peterson M.D.   On: 04/28/2022 16:46    Anti-infectives: Anti-infectives (From admission, onward)    Start     Dose/Rate Route Frequency Ordered Stop   04/27/22 1800  piperacillin-tazobactam (ZOSYN) IVPB 3.375 g        3.375 g 12.5 mL/hr over 240 Minutes Intravenous Every 8 hours 04/27/22 1452     04/27/22 1200  piperacillin-tazobactam (ZOSYN) IVPB 3.375 g        3.375 g 100 mL/hr over 30 Minutes  Intravenous  Once 04/27/22 1157 04/27/22 1309       Assessment/Plan: s/p Procedure(s): CYSTOSCOPY WITH RETROGRADE PYELOGRAM/URETERAL STENT PLACEMENT (Bilateral)  Sigmoid diverticulitis with multiple abscesses - 1st bout, never had a colonoscopy - CT 11/8 shows acute sigmoid diverticulitis with 2 loculated fluid  collections each measuring less than 4 cm in size immediately adjacent to the sigmoid colon suggesting possible pericolic abscesses; there is a linear 8.6 x 2.1 cm loculated fluid collection in left side of pelvis medial to the iliac vessels with interval increase suggesting loculated serous fluid collection or another developing abscess; no pneumoperitoneum -Not amenable to IR drainage 11/9 -We will try full liquids today  and IV antibiotics, continue to follow WBC.  She is afebrile and has a fairly benign exam at this point.  Anticipate repeat imaging early next week to assess for drain ability versus progression. Ultimately if this resolves with conservative measures patient will need outpatient GI follow up for colonoscopy in 6-8 weeks.   ID - zosyn 11/8 FEN - IVF, NPO VTE - ok for chemical dvt ppx after IR procedure from our standpoint Foley - none   R renal calculi, L UPJ stone with hydronephrosis, UTI - s/p bilateral ureteral stent placement 11/8, urology following HTN Obesity BMI 40.50       LOS: 3 days    Clovis Pu Alyona Romack MD  04/30/2022 Total time 30 minutes

## 2022-05-01 DIAGNOSIS — N390 Urinary tract infection, site not specified: Secondary | ICD-10-CM | POA: Diagnosis not present

## 2022-05-01 DIAGNOSIS — I10 Essential (primary) hypertension: Secondary | ICD-10-CM | POA: Diagnosis not present

## 2022-05-01 DIAGNOSIS — K5720 Diverticulitis of large intestine with perforation and abscess without bleeding: Secondary | ICD-10-CM | POA: Diagnosis not present

## 2022-05-01 DIAGNOSIS — N139 Obstructive and reflux uropathy, unspecified: Secondary | ICD-10-CM | POA: Diagnosis not present

## 2022-05-01 MED ORDER — MENTHOL 3 MG MT LOZG
1.0000 | LOZENGE | OROMUCOSAL | Status: DC | PRN
  Filled 2022-05-01: qty 9

## 2022-05-01 MED ORDER — GUAIFENESIN-DM 100-10 MG/5ML PO SYRP
5.0000 mL | ORAL_SOLUTION | ORAL | Status: DC | PRN
  Administered 2022-05-01: 5 mL via ORAL
  Filled 2022-05-01: qty 10

## 2022-05-01 NOTE — Progress Notes (Signed)
PROGRESS NOTE    Heidi Peterson  NTI:[id number] DOB: [birth year] DOA: 04/27/2022 PCP: [facility]   Brief Narrative: Heidi Peterson is a 71 y.o. female with a history of hypertension who presented secondary to abdominal and flank pain and found to have a left obstructive UPJ stone and diverticulitis with abscess. Urology and General surgery consulted. Patient underwent bilateral ureteral stent placement on 11/8. Attempt to place drain into diverticular abscess unsuccessful.   Assessment and Plan: * Diverticulitis of intestine with abscess Empiric Zosyn initiated. General surgery consulted with recommendation for IR drain. IR attempted but could not find safe window for aspiration/drain placement. Leukocytosis mostly stable. -General surgery recommendations: repeat CT scan in 1-2 days -Continue Zosyn IV -CBC in AM  Complicated UTI (urinary tract infection) Associated obstructive uropathy with likely infected stone. Urine culture significant for pan-sensitive E. Coli. Patient empirically on Zosyn IV. -Continue Zosyn IV secondary to intraabdominal infection  Obstructive uropathy Secondary to left UPJ stone with associated bilateral non obstructing ureteral stones requiring bilateral ureteral stent placement by urology on 11/8. Creatinine stable.  Obesity, Class III, BMI 40-49.9 (morbid obesity) (HCC) Estimated body mass index is 40.5 kg/m as calculated from the following:   Height as of this encounter: 5' 2.5" (1.588 m).   Weight as of this encounter: 102.1 kg.  HTN (hypertension) -Continue metoprolol succinate  Hyperglycemia Hemoglobin A1C of 6.5%. Discussed with patient and she prefers lifestyle changes. Will allow follow-up with primary care physician for repeat hemoglobin A1C to confirm diagnosis of diabetes at that time.  Microcytic anemia Chronic. Stable. Iron panel with possible iron deficiency, although ferritin is normal. -Iron  supplementation on discharge    DVT prophylaxis: SCDs Code Status:   Code Status: Full Code Family Communication: Husband at bedside Disposition Plan: Discharge home pending specialist recommendations and transition to outpatient antibiotic regimen   Consultants:  General surgery Interventional radiology  Procedures:  11/8: Cystoscopy with bilateral retrograde pyelogram, bilateral ureteral stent placement   Antimicrobials: Zosyn IV    Subjective: Abdominal pain is stable. No nausea/vomiting.  Objective: BP (!) 145/60 (BP Location: Left Arm)   Pulse 70   Temp 99.9 F (37.7 C) (Oral)   Resp 18   Ht 5' 2.5" (1.588 m)   Wt 102.1 kg   SpO2 94%   BMI 40.50 kg/m   Examination:  General exam: Appears calm and comfortable Respiratory system: Clear to auscultation. Respiratory effort normal. Cardiovascular system: S1 & S2 heard, RRR. Gastrointestinal system: Abdomen is nondistended, soft and nontender. No organomegaly or masses felt. Normal bowel sounds heard. Central nervous system: Alert and oriented. No focal neurological deficits. Musculoskeletal: No edema. No calf tenderness Skin: No cyanosis. No rashes Psychiatry: Judgement and insight appear normal. Mood & affect appropriate.    Data Reviewed: I have personally reviewed following labs and imaging studies  CBC Lab Results  Component Value Date   WBC 15.2 (H) 04/30/2022   RBC 3.99 04/30/2022   HGB 9.2 (L) 04/30/2022   HCT 31.0 (L) 04/30/2022   MCV 77.7 (L) 04/30/2022   MCH 23.1 (L) 04/30/2022   PLT 463 (H) 04/30/2022   MCHC 29.7 (L) 04/30/2022   RDW 16.7 (H) 04/30/2022   LYMPHSABS 1.5 04/27/2022   MONOABS 0.6 04/27/2022   EOSABS 0.1 04/27/2022   BASOSABS 0.0 04/27/2022     Last metabolic panel Lab Results  Component Value Date   NA 138 04/30/2022   K 3.6 04/30/2022   CL 109 04/30/2022  CO2 24 04/30/2022   BUN 18 04/30/2022   CREATININE 0.92 04/30/2022   GLUCOSE 138 (H) 04/30/2022   GFRNONAA  >60 04/30/2022   CALCIUM 8.9 04/30/2022   PROT 7.3 04/28/2022   ALBUMIN 3.0 (L) 04/28/2022   BILITOT 0.6 04/28/2022   ALKPHOS 77 04/28/2022   AST 17 04/28/2022   ALT 18 04/28/2022   ANIONGAP 5 04/30/2022    GFR: Estimated Creatinine Clearance: 64.3 mL/min (by C-G formula based on SCr of 0.92 mg/dL).  Recent Results (from the past 240 hour(s))  Urine Culture     Status: Abnormal   Collection Time: 04/27/22  7:53 AM   Specimen: Urine, Clean Catch  Result Value Ref Range Status   Specimen Description   Final    URINE, CLEAN CATCH Performed at [facility], [address] [place], [place] [address]    Special Requests   Final    NONE Performed at [facility], [address], [place], [place] [address]    Culture >=100,000 COLONIES/mL ESCHERICHIA COLI (A)  Final   Report Status 04/29/2022 FINAL  Final   Organism ID, Bacteria ESCHERICHIA COLI (A)  Final      Susceptibility   Escherichia coli - MIC*    AMPICILLIN 4 SENSITIVE Sensitive     CEFAZOLIN <=4 SENSITIVE Sensitive     CEFEPIME <=0.12 SENSITIVE Sensitive     CEFTRIAXONE <=0.25 SENSITIVE Sensitive     CIPROFLOXACIN <=0.25 SENSITIVE Sensitive     GENTAMICIN <=1 SENSITIVE Sensitive     IMIPENEM <=0.25 SENSITIVE Sensitive     NITROFURANTOIN <=16 SENSITIVE Sensitive     TRIMETH/SULFA <=20 SENSITIVE Sensitive     AMPICILLIN/SULBACTAM <=2 SENSITIVE Sensitive     PIP/TAZO <=4 SENSITIVE Sensitive     * >=100,000 COLONIES/mL ESCHERICHIA COLI  Culture, blood (Routine X 2) w Reflex to ID Panel     Status: None (Preliminary result)   Collection Time: 04/27/22  1:50 PM   Specimen: BLOOD  Result Value Ref Range Status   Specimen Description   Final    BLOOD RIGHT ANTECUBITAL Performed at [facility], [address], [place], [place] [address]    Special Requests   Final    BOTTLES DRAWN AEROBIC AND ANAEROBIC Blood Culture adequate volume Performed at [facility], [address] [place], [place] [address]    Culture   Final    NO GROWTH 4 DAYS Performed at [facility], [address] [place], [place] [address]    Report Status PENDING  Incomplete  Culture, blood (Routine X 2) w Reflex to ID Panel     Status: None (Preliminary result)   Collection Time: 04/27/22  9:10 PM   Specimen: BLOOD LEFT ARM  Result Value Ref Range Status   Specimen Description   Final    BLOOD LEFT ARM Performed at [facility], [address] [place], [place] [address]    Special Requests   Final    BOTTLES DRAWN AEROBIC AND ANAEROBIC Blood Culture adequate volume Performed at [facility], [address], [place], [place] [address]    Culture   Final    NO GROWTH 4 DAYS Performed at [facility], [address], [place], [place] [address]    Report Status PENDING  Incomplete      Radiology Studies: No results found.    LOS: 4 days    Jacquelin Hawking, MD [facility] 05/01/2022, 9:06  AM   If 7PM-7AM, please contact night-coverage [email or web address]

## 2022-05-01 NOTE — Progress Notes (Addendum)
4 Days Post-Op   Subjective/Chief Complaint: Patient feels about the same.  Does have left-sided pain that she thinks is from her stents.  States her abdomen does not really hurt her.   Objective: Vital signs in last 24 hours: Temp:  [98.7 F (37.1 C)-99.9 F (37.7 C)] 99.9 F (37.7 C) (11/12 0349) Pulse Rate:  [68-72] 70 (11/12 0349) Resp:  [18-24] 18 (11/12 0349) BP: (145-156)/(60-73) 145/60 (11/12 0349) SpO2:  [86 %-96 %] 94 % (11/12 0349) Last BM Date : 04/26/22  Intake/Output from previous day: 11/11 0701 - 11/12 0700 In: 2549.9 [P.O.:600; I.V.:1799.9; IV Piggyback:150] Out: 600 [Urine:600] Intake/Output this shift: No intake/output data recorded.   Gen:  Alert, NAD, pleasant Abd: soft, ND, +BS, lap and midline incisions well healed.  Minimally tender in the left lower quadrant and suprapubic region Lab Results:  Recent Labs    04/29/22 0427 04/30/22 0543  WBC 17.6* 15.2*  HGB 9.4* 9.2*  HCT 31.0* 31.0*  PLT 481* 463*   BMET Recent Labs    04/29/22 0427 04/30/22 0543  NA 139 138  K 3.7 3.6  CL 109 109  CO2 23 24  GLUCOSE 158* 138*  BUN 22 18  CREATININE 0.95 0.92  CALCIUM 9.1 8.9   PT/INR No results for input(s): "LABPROT", "INR" in the last 72 hours. ABG No results for input(s): "PHART", "HCO3" in the last 72 hours.  Invalid input(s): "PCO2", "PO2"  Studies/Results: No results found.  Anti-infectives: Anti-infectives (From admission, onward)    Start     Dose/Rate Route Frequency Ordered Stop   04/27/22 1800  piperacillin-tazobactam (ZOSYN) IVPB 3.375 g        3.375 g 12.5 mL/hr over 240 Minutes Intravenous Every 8 hours 04/27/22 1452     04/27/22 1200  piperacillin-tazobactam (ZOSYN) IVPB 3.375 g        3.375 g 100 mL/hr over 30 Minutes Intravenous  Once 04/27/22 1157 04/27/22 1309       Assessment/Plan: s/p Procedure(s): CYSTOSCOPY WITH RETROGRADE PYELOGRAM/URETERAL STENT PLACEMENT (Bilateral)   Sigmoid diverticulitis with  multiple abscesses - 1st bout, never had a colonoscopy - CT 11/8 shows acute sigmoid diverticulitis with 2 loculated fluid collections each measuring less than 4 cm in size immediately adjacent to the sigmoid colon suggesting possible pericolic abscesses; there is a linear 8.6 x 2.1 cm loculated fluid collection in left side of pelvis medial to the iliac vessels with interval increase suggesting loculated serous fluid collection or another developing abscess; no pneumoperitoneum -Not amenable to IR drainage 11/9 -We will try full liquids today  and IV antibiotics, continue to follow WBC.  She is afebrile and has a fairly benign exam at this point.  Anticipate repeat imaging early next week to assess for drain ability versus progression.   Exam stable today.  Keep on full liquids.  Recheck CBC in AM.  Will need repeat CT scanning either tomorrow or Tuesday.  Primary service to follow-up on Monday.  Continue IV antibiotics for now.  Ultimately if this resolves with conservative measures patient will need outpatient GI follow up for colonoscopy in 6-8 weeks.   ID - zosyn 11/8 FEN - IVF, NPO VTE - ok for chemical dvt ppx after IR procedure from our standpoint Foley - none   R renal calculi, L UPJ stone with hydronephrosis, UTI - s/p bilateral ureteral stent placement 11/8, urology following    LOS: 4 days    Clovis Pu Mylena Sedberry  MD  05/01/2022 TOTAL TIME 20 MINUTES

## 2022-05-01 NOTE — Plan of Care (Signed)
Patient sleeping with cpap on and iv infusing.  Able to ambulate with iv pole to bathroom and return to bed without difficulty. Pain controlled

## 2022-05-01 NOTE — Plan of Care (Signed)
  Problem: Education: Goal: Knowledge of General Education information will improve Description Including pain rating scale, medication(s)/side effects and non-pharmacologic comfort measures Outcome: Progressing   

## 2022-05-02 LAB — CBC
HCT: 30.8 % — ABNORMAL LOW (ref 36.0–46.0)
Hemoglobin: 9.2 g/dL — ABNORMAL LOW (ref 12.0–15.0)
MCH: 23.1 pg — ABNORMAL LOW (ref 26.0–34.0)
MCHC: 29.9 g/dL — ABNORMAL LOW (ref 30.0–36.0)
MCV: 77.2 fL — ABNORMAL LOW (ref 80.0–100.0)
Platelets: 505 10*3/uL — ABNORMAL HIGH (ref 150–400)
RBC: 3.99 MIL/uL (ref 3.87–5.11)
RDW: 16.6 % — ABNORMAL HIGH (ref 11.5–15.5)
WBC: 14.5 10*3/uL — ABNORMAL HIGH (ref 4.0–10.5)
nRBC: 0 % (ref 0.0–0.2)

## 2022-05-02 LAB — CULTURE, BLOOD (ROUTINE X 2)
Culture: NO GROWTH
Culture: NO GROWTH
Special Requests: ADEQUATE
Special Requests: ADEQUATE

## 2022-05-02 NOTE — Progress Notes (Signed)
PROGRESS NOTE    Heidi Peterson  IEP:[id number] DOB: 1951/03/22 DOA: 04/27/2022 PCP: [facility]   Brief Narrative: Heidi Peterson is a 71 y.o. female with a history of hypertension who presented secondary to abdominal and flank pain and found to have a left obstructive UPJ stone and diverticulitis with abscess. Urology and General surgery consulted. Patient underwent bilateral ureteral stent placement on 11/8. Attempt to place drain into diverticular abscess unsuccessful.   Assessment and Plan: * Diverticulitis of intestine with abscess Empiric Zosyn initiated. General surgery consulted with recommendation for IR drain. IR attempted but could not find safe window for aspiration/drain placement. Leukocytosis mostly stable. -General surgery recommendations: planning repeat CT scan; recommendations pending today -Continue Zosyn IV -CBC in AM  Complicated UTI (urinary tract infection) Associated obstructive uropathy with likely infected stone. Urine culture significant for pan-sensitive E. Coli. Patient empirically on Zosyn IV. -Continue Zosyn IV secondary to intraabdominal infection  Obstructive uropathy Secondary to left UPJ stone with associated bilateral non obstructing ureteral stones requiring bilateral ureteral stent placement by urology on 11/8. Creatinine stable.  Obesity, Class III, BMI 40-49.9 (morbid obesity) (HCC) Estimated body mass index is 40.5 kg/m as calculated from the following:   Height as of this encounter: 5' 2.5" (1.588 m).   Weight as of this encounter: 102.1 kg.  HTN (hypertension) -Continue metoprolol succinate  Hyperglycemia Hemoglobin A1C of 6.5%. Discussed with patient and she prefers lifestyle changes. Will allow follow-up with primary care physician for repeat hemoglobin A1C to confirm diagnosis of diabetes at that time.  Microcytic anemia Chronic. Stable. Iron panel with possible iron deficiency, although  ferritin is normal. -Iron supplementation on discharge    DVT prophylaxis: SCDs Code Status:   Code Status: Full Code Family Communication: Husband at bedside Disposition Plan: Discharge home pending specialist recommendations and transition to outpatient antibiotic regimen   Consultants:  General surgery Interventional radiology  Procedures:  11/8: Cystoscopy with bilateral retrograde pyelogram, bilateral ureteral stent placement   Antimicrobials: Zosyn IV    Subjective: Abdominal pain mostly from stent but is manageable. No nausea, vomiting. Patient with flatus and having gas pain. No bowel movement.  Objective: BP (!) 145/72 (BP Location: Left Arm)   Pulse (!) 57   Temp 97.8 F (36.6 C) (Oral)   Resp 20   Ht 5' 2.5" (1.588 m)   Wt 102.1 kg   SpO2 96%   BMI 40.50 kg/m   Examination:  General exam: Appears calm and comfortable Respiratory system: Clear to auscultation. Respiratory effort normal. Cardiovascular system: S1 & S2 heard, RRR. No murmurs, rubs, gallops or clicks. Gastrointestinal system: Abdomen is soft and nontender. Normal bowel sounds heard. Central nervous system: Alert and oriented. No focal neurological deficits. Musculoskeletal: No calf tenderness Skin: No cyanosis. No rashes Psychiatry: Judgement and insight appear normal. Mood & affect appropriate.    Data Reviewed: I have personally reviewed following labs and imaging studies  CBC Lab Results  Component Value Date   WBC 14.5 (H) 05/02/2022   RBC 3.99 05/02/2022   HGB 9.2 (L) 05/02/2022   HCT 30.8 (L) 05/02/2022   MCV 77.2 (L) 05/02/2022   MCH 23.1 (L) 05/02/2022   PLT 505 (H) 05/02/2022   MCHC 29.9 (L) 05/02/2022   RDW 16.6 (H) 05/02/2022   LYMPHSABS 1.5 04/27/2022   MONOABS 0.6 04/27/2022   EOSABS 0.1 04/27/2022   BASOSABS 0.0 04/27/2022     Last metabolic panel Lab Results  Component Value Date  NA 138 04/30/2022   K 3.6 04/30/2022   CL 109 04/30/2022   CO2 24  04/30/2022   BUN 18 04/30/2022   CREATININE 0.92 04/30/2022   GLUCOSE 138 (H) 04/30/2022   GFRNONAA >60 04/30/2022   CALCIUM 8.9 04/30/2022   PROT 7.3 04/28/2022   ALBUMIN 3.0 (L) 04/28/2022   BILITOT 0.6 04/28/2022   ALKPHOS 77 04/28/2022   AST 17 04/28/2022   ALT 18 04/28/2022   ANIONGAP 5 04/30/2022    GFR: Estimated Creatinine Clearance: 64.3 mL/min (by C-G formula based on SCr of 0.92 mg/dL).  Recent Results (from the past 240 hour(s))  Urine Culture     Status: Abnormal   Collection Time: 04/27/22  7:53 AM   Specimen: Urine, Clean Catch  Result Value Ref Range Status   Specimen Description   Final    URINE, CLEAN CATCH Performed at [facility], [address] [place], [place] [address]    Special Requests   Final    NONE Performed at [facility], [address], [place], [place] [address]    Culture >=100,000 COLONIES/mL ESCHERICHIA COLI (A)  Final   Report Status 04/29/2022 FINAL  Final   Organism ID, Bacteria ESCHERICHIA COLI (A)  Final      Susceptibility   Escherichia coli - MIC*    AMPICILLIN 4 SENSITIVE Sensitive     CEFAZOLIN <=4 SENSITIVE Sensitive     CEFEPIME <=0.12 SENSITIVE Sensitive     CEFTRIAXONE <=0.25 SENSITIVE Sensitive     CIPROFLOXACIN <=0.25 SENSITIVE Sensitive     GENTAMICIN <=1 SENSITIVE Sensitive     IMIPENEM <=0.25 SENSITIVE Sensitive     NITROFURANTOIN <=16 SENSITIVE Sensitive     TRIMETH/SULFA <=20 SENSITIVE Sensitive     AMPICILLIN/SULBACTAM <=2 SENSITIVE Sensitive     PIP/TAZO <=4 SENSITIVE Sensitive     * >=100,000 COLONIES/mL ESCHERICHIA COLI  Culture, blood (Routine X 2) w Reflex to ID Panel     Status: None   Collection Time: 04/27/22  1:50 PM   Specimen: BLOOD  Result Value Ref Range Status   Specimen Description   Final    BLOOD RIGHT ANTECUBITAL Performed at [facility], [address], [place], [place] [address]    Special Requests   Final    BOTTLES DRAWN  AEROBIC AND ANAEROBIC Blood Culture adequate volume Performed at [facility], [address], [place], [place] [address]    Culture   Final    NO GROWTH 5 DAYS Performed at [facility], [address] [place], [place] [address]    Report Status 05/02/2022 FINAL  Final  Culture, blood (Routine X 2) w Reflex to ID Panel     Status: None   Collection Time: 04/27/22  9:10 PM   Specimen: BLOOD LEFT ARM  Result Value Ref Range Status   Specimen Description   Final    BLOOD LEFT ARM Performed at [facility], [address] [place], [place] [address]    Special Requests   Final    BOTTLES DRAWN AEROBIC AND ANAEROBIC Blood Culture adequate volume Performed at [facility], [address] [place], [place] [address]    Culture   Final    NO GROWTH 5 DAYS Performed at [facility], [address], [place], [place] [address]    Report Status 05/02/2022 FINAL  Final      Radiology Studies: No results found.  LOS: 5 days    Jacquelin Hawking, MD [facility] 05/02/2022, 9:29 AM   If 7PM-7AM, please contact night-coverage [email or web address]

## 2022-05-02 NOTE — Progress Notes (Signed)
[facility] Progress Note  5 Days Post-Op  Subjective: CC-  Feels about the same as yesterday. Continues to have some left sided abdominal pain but thinks it is from the stent. No n/v. Passing flatus, no BM since last Tuesday.  Objective: Vital signs in last 24 hours: Temp:  [97.8 F (36.6 C)-98.6 F (37 C)] 97.8 F (36.6 C) (11/13 0456) Pulse Rate:  [57-72] 57 (11/13 0838) Resp:  [18-20] 20 (11/13 0456) BP: (145-172)/(55-72) 145/72 (11/13 0838) SpO2:  [96 %-98 %] 96 % (11/13 0838) Last BM Date : 04/27/22  Intake/Output from previous day: 11/12 0701 - 11/13 0700 In: 170 [P.O.:120; IV Piggyback:50] Out: 800 [Urine:800] Intake/Output this shift: No intake/output data recorded.  PE: Gen:  Alert, NAD, pleasant Abd: soft, ND, +BS, lap and midline incisions well healed.  Minimally tender in the left lower quadrant and suprapubic region  Lab Results:  Recent Labs    04/30/22 0543 05/02/22 0442  WBC 15.2* 14.5*  HGB 9.2* 9.2*  HCT 31.0* 30.8*  PLT 463* 505*   BMET Recent Labs    04/30/22 0543  NA 138  K 3.6  CL 109  CO2 24  GLUCOSE 138*  BUN 18  CREATININE 0.92  CALCIUM 8.9   PT/INR No results for input(s): "LABPROT", "INR" in the last 72 hours. CMP     Component Value Date/Time   NA 138 04/30/2022 0543   K 3.6 04/30/2022 0543   CL 109 04/30/2022 0543   CO2 24 04/30/2022 0543   GLUCOSE 138 (H) 04/30/2022 0543   BUN 18 04/30/2022 0543   CREATININE 0.92 04/30/2022 0543   CALCIUM 8.9 04/30/2022 0543   PROT 7.3 04/28/2022 0431   ALBUMIN 3.0 (L) 04/28/2022 0431   AST 17 04/28/2022 0431   ALT 18 04/28/2022 0431   ALKPHOS 77 04/28/2022 0431   BILITOT 0.6 04/28/2022 0431   GFRNONAA >60 04/30/2022 0543   Lipase     Component Value Date/Time   LIPASE 26 04/27/2022 0753       Studies/Results: No results found.  Anti-infectives: Anti-infectives (From admission, onward)    Start     Dose/Rate Route Frequency Ordered Stop   04/27/22  1800  piperacillin-tazobactam (ZOSYN) IVPB 3.375 g        3.375 g 12.5 mL/hr over 240 Minutes Intravenous Every 8 hours 04/27/22 1452     04/27/22 1200  piperacillin-tazobactam (ZOSYN) IVPB 3.375 g        3.375 g 100 mL/hr over 30 Minutes Intravenous  Once 04/27/22 1157 04/27/22 1309        Assessment/Plan Sigmoid diverticulitis with multiple abscesses - 1st bout, never had a colonoscopy - CT 11/8 shows acute sigmoid diverticulitis with 2 loculated fluid collections each measuring less than 4 cm in size immediately adjacent to the sigmoid colon suggesting possible pericolic abscesses; there is a linear 8.6 x 2.1 cm loculated fluid collection in left side of pelvis medial to the iliac vessels with interval increase suggesting loculated serous fluid collection or another developing abscess; no pneumoperitoneum - Not amenable to IR drainage 11/9  - Exam stable and WBC slightly down today. Continue IV antibiotics. Plan for repeat CT scan today vs tomorrow, will discuss timing with MD.   ID - zosyn 11/8 FEN - IVF, FLD VTE - ok for chemical dvt ppx from surgical standpoint Foley - none   R renal calculi, L UPJ stone with hydronephrosis, UTI - s/p bilateral ureteral stent placement 11/8, urology following HTN Obesity BMI  40.50   I reviewed Consultant interventional radiology notes, hospitalist notes, last 24 h vitals and pain scores, last 48 h intake and output, last 24 h labs and trends, and last 24 h imaging results.    LOS: 5 days    Franne Forts, [facility] 05/02/2022, 10:50 AM Please see Amion for pager number during day hours 7:00am-4:30pm

## 2022-05-03 ENCOUNTER — Inpatient Hospital Stay (HOSPITAL_COMMUNITY): Payer: Medicare Other

## 2022-05-03 MED ORDER — FENTANYL CITRATE (PF) 100 MCG/2ML IJ SOLN
INTRAMUSCULAR | Status: AC
  Filled 2022-05-03: qty 2

## 2022-05-03 MED ORDER — FENTANYL CITRATE (PF) 100 MCG/2ML IJ SOLN
INTRAMUSCULAR | Status: AC | PRN
  Administered 2022-05-03 (×2): 50 ug via INTRAVENOUS

## 2022-05-03 MED ORDER — MIDAZOLAM HCL 2 MG/2ML IJ SOLN
INTRAMUSCULAR | Status: AC
  Filled 2022-05-03: qty 2

## 2022-05-03 MED ORDER — IOHEXOL 9 MG/ML PO SOLN
500.0000 mL | ORAL | Status: AC
  Administered 2022-05-03 (×2): 500 mL via ORAL

## 2022-05-03 MED ORDER — IOHEXOL 9 MG/ML PO SOLN
ORAL | Status: AC
  Administered 2022-05-03: 500 mL
  Filled 2022-05-03: qty 1000

## 2022-05-03 MED ORDER — SODIUM CHLORIDE 0.9% FLUSH
5.0000 mL | Freq: Three times a day (TID) | INTRAVENOUS | Status: DC
  Administered 2022-05-03 – 2022-05-05 (×6): 5 mL

## 2022-05-03 MED ORDER — SODIUM CHLORIDE (PF) 0.9 % IJ SOLN
INTRAMUSCULAR | Status: AC
  Filled 2022-05-03: qty 50

## 2022-05-03 MED ORDER — LIDOCAINE HCL (PF) 1 % IJ SOLN
INTRAMUSCULAR | Status: AC | PRN
  Administered 2022-05-03: 10 mL

## 2022-05-03 MED ORDER — IOHEXOL 300 MG/ML  SOLN
100.0000 mL | Freq: Once | INTRAMUSCULAR | Status: AC | PRN
  Administered 2022-05-03: 100 mL via INTRAVENOUS

## 2022-05-03 MED ORDER — MIDAZOLAM HCL 2 MG/2ML IJ SOLN
INTRAMUSCULAR | Status: AC | PRN
  Administered 2022-05-03 (×2): 1 mg via INTRAVENOUS

## 2022-05-03 NOTE — Progress Notes (Signed)
PROGRESS NOTE    Heidi Peterson  BJS:[id number] DOB: 10-22-1950 DOA: 04/27/2022 PCP: [facility]   Brief Narrative: Heidi Peterson is a 71 y.o. female with a history of hypertension who presented secondary to abdominal and flank pain and found to have a left obstructive UPJ stone and diverticulitis with abscess. Urology and General surgery consulted. Patient underwent bilateral ureteral stent placement on 11/8. Attempt to place drain into diverticular abscess unsuccessful. Repeat CT abdomen/pelvis shows evidence of new abscess.   Assessment and Plan:  * Diverticulitis of intestine with abscess Empiric Zosyn initiated. General surgery consulted with recommendation for IR drain. IR attempted but could not find safe window for aspiration/drain placement. Leukocytosis stable. CT abdomen/pelvis shows new abscess adjacent to previously noted abscess. -General surgery recommendations: pending after CT results. I anticipate IR consultation for evaluation -Continue Zosyn IV -CBC in AM  Complicated UTI (urinary tract infection) Associated obstructive uropathy with likely infected stone. Urine culture significant for pan-sensitive E. Coli. Patient empirically on Zosyn IV. -Continue Zosyn IV secondary to intraabdominal infection  Obstructive uropathy Secondary to left UPJ stone with associated bilateral non obstructing ureteral stones requiring bilateral ureteral stent placement by urology on 11/8. Creatinine stable.  Obesity, Class III, BMI 40-49.9 (morbid obesity) (HCC) Estimated body mass index is 40.5 kg/m as calculated from the following:   Height as of this encounter: 5' 2.5" (1.588 m).   Weight as of this encounter: 102.1 kg.  HTN (hypertension) -Continue metoprolol succinate  Hyperglycemia Hemoglobin A1C of 6.5%. Discussed with patient and she prefers lifestyle changes. Will allow follow-up with primary care physician for repeat hemoglobin A1C  to confirm diagnosis of diabetes at that time.  Microcytic anemia Chronic. Stable. Iron panel with possible iron deficiency, although ferritin is normal. -Iron supplementation on discharge    DVT prophylaxis: SCDs Code Status:   Code Status: Full Code Family Communication: None at bedside Disposition Plan: Discharge home pending specialist recommendations and transition to outpatient antibiotic regimen   Consultants:  General surgery Interventional radiology  Procedures:  11/8: Cystoscopy with bilateral retrograde pyelogram, bilateral ureteral stent placement   Antimicrobials: Zosyn IV    Subjective: Patient is not feeling well today. Sleepy as she did not get much sleep.  Objective: BP (!) 152/65 (BP Location: Right Arm)   Pulse 62   Temp (!) 97.3 F (36.3 C) (Oral)   Resp 20   Ht 5' 2.5" (1.588 m)   Wt 102.1 kg   SpO2 97%   BMI 40.50 kg/m   Examination:  General exam: Appears calm and comfortable Respiratory system: Respiratory effort normal. Gastrointestinal system: Abdomen is soft and non-tender. Central nervous system: Alert and oriented. Musculoskeletal: No calf tenderness Psychiatry: Judgement and insight appear normal. Mood & affect appropriate.    Data Reviewed: I have personally reviewed following labs and imaging studies  CBC Lab Results  Component Value Date   WBC 14.5 (H) 05/02/2022   RBC 3.99 05/02/2022   HGB 9.2 (L) 05/02/2022   HCT 30.8 (L) 05/02/2022   MCV 77.2 (L) 05/02/2022   MCH 23.1 (L) 05/02/2022   PLT 505 (H) 05/02/2022   MCHC 29.9 (L) 05/02/2022   RDW 16.6 (H) 05/02/2022   LYMPHSABS 1.5 04/27/2022   MONOABS 0.6 04/27/2022   EOSABS 0.1 04/27/2022   BASOSABS 0.0 04/27/2022     Last metabolic panel Lab Results  Component Value Date   NA 138 04/30/2022   K 3.6 04/30/2022   CL 109 04/30/2022  CO2 24 04/30/2022   BUN 18 04/30/2022   CREATININE 0.92 04/30/2022   GLUCOSE 138 (H) 04/30/2022   GFRNONAA >60 04/30/2022    CALCIUM 8.9 04/30/2022   PROT 7.3 04/28/2022   ALBUMIN 3.0 (L) 04/28/2022   BILITOT 0.6 04/28/2022   ALKPHOS 77 04/28/2022   AST 17 04/28/2022   ALT 18 04/28/2022   ANIONGAP 5 04/30/2022    GFR: Estimated Creatinine Clearance: 64.3 mL/min (by C-G formula based on SCr of 0.92 mg/dL).  Recent Results (from the past 240 hour(s))  Urine Culture     Status: Abnormal   Collection Time: 04/27/22  7:53 AM   Specimen: Urine, Clean Catch  Result Value Ref Range Status   Specimen Description   Final    URINE, CLEAN CATCH Performed at [facility], [address], [place], [place] [address]    Special Requests   Final    NONE Performed at [facility], [address], [place], [place] [address]    Culture >=100,000 COLONIES/mL ESCHERICHIA COLI (A)  Final   Report Status 04/29/2022 FINAL  Final   Organism ID, Bacteria ESCHERICHIA COLI (A)  Final      Susceptibility   Escherichia coli - MIC*    AMPICILLIN 4 SENSITIVE Sensitive     CEFAZOLIN <=4 SENSITIVE Sensitive     CEFEPIME <=0.12 SENSITIVE Sensitive     CEFTRIAXONE <=0.25 SENSITIVE Sensitive     CIPROFLOXACIN <=0.25 SENSITIVE Sensitive     GENTAMICIN <=1 SENSITIVE Sensitive     IMIPENEM <=0.25 SENSITIVE Sensitive     NITROFURANTOIN <=16 SENSITIVE Sensitive     TRIMETH/SULFA <=20 SENSITIVE Sensitive     AMPICILLIN/SULBACTAM <=2 SENSITIVE Sensitive     PIP/TAZO <=4 SENSITIVE Sensitive     * >=100,000 COLONIES/mL ESCHERICHIA COLI  Culture, blood (Routine X 2) w Reflex to ID Panel     Status: None   Collection Time: 04/27/22  1:50 PM   Specimen: BLOOD  Result Value Ref Range Status   Specimen Description   Final    BLOOD RIGHT ANTECUBITAL Performed at [facility], [address], [place], [place] [address]    Special Requests   Final    BOTTLES DRAWN AEROBIC AND ANAEROBIC Blood Culture adequate volume Performed at [facility], [address],  [place], [place] [address]    Culture   Final    NO GROWTH 5 DAYS Performed at [facility], [address] [place], [place] [address]    Report Status 05/02/2022 FINAL  Final  Culture, blood (Routine X 2) w Reflex to ID Panel     Status: None   Collection Time: 04/27/22  9:10 PM   Specimen: BLOOD LEFT ARM  Result Value Ref Range Status   Specimen Description   Final    BLOOD LEFT ARM Performed at [facility], [address], [place], [place] [address]    Special Requests   Final    BOTTLES DRAWN AEROBIC AND ANAEROBIC Blood Culture adequate volume Performed at [facility], [address] [place], [place] [address]    Culture   Final    NO GROWTH 5 DAYS Performed at [facility], [address], [place], [place] [address]    Report Status 05/02/2022 FINAL  Final      Radiology Studies: No results found.    LOS: 6 days    Jacquelin Hawking, MD [facility] 05/03/2022, 12:20 PM  If 7PM-7AM, please contact night-coverage [email or web address]

## 2022-05-03 NOTE — Progress Notes (Signed)
Referring Physician(s): Blackman,D  Supervising Physician: Corrie Mckusick  Patient Status:  [facility] - In-pt  Chief Complaint:  Abdominal pain/abscess  Subjective: Pt doing ok; still has some LLQ discomfort; f/u CT today showed:  1. Interval placement of bilateral nephroureteral stents, in appropriate position. Slight improvement in now mild-to-moderate left hydronephrosis. Left ureteropelvic junction 7 mm stone is seen previously a 9 mm stone was seen. 2. Right lower pole 8 mm and 6 mm caliceal stones likely have mildly moved from the more renal pelvis location previously. 3. Sigmoid diverticulosis with inflammatory wall thickening again indicating diverticulitis. Stable to minimally decreased size of an abscess just superior to the sigmoid colon, however this connects to a now larger left hemipelvis where enhancing abscess and a newly developing small posterior midline pelvis rim enhancing abscess, as described above. The left hemipelvis abscess now more diffusely contacts the inferior aspect of the left ovary. 4. Small left and trace right pleural effusions, new from prior. 5. Two 4-mm right middle lobe pulmonary nodules are unchanged from oldest available CT 08/09/2021. No follow-up needed if patient is low-risk (and has no known or suspected primary neoplasm). Non-contrast chest CT can be considered in 12 months if patient is high-risk  She is afebrile; last WBC 14.5(15.2); blood cx neg; urine cx- e coli; pt seen previously on 11/9 for poss pelvic drain placement but no perc window available; now request received again from [facility] to reevaluate for drain; pt denies fever,HA,CP, dyspnea, cough, N/V or bleeding    Past Medical History:  Diagnosis Date   Kidney stones    Past Surgical History:  Procedure Laterality Date   CYSTOSCOPY W/ URETERAL STENT PLACEMENT Bilateral 04/27/2022   Procedure: CYSTOSCOPY WITH RETROGRADE PYELOGRAM/URETERAL STENT PLACEMENT;  Surgeon: Lucas Mallow, MD;  Location: [facility];  Service: Urology;  Laterality: Bilateral;   IR RADIOLOGIST EVAL & MGMT  08/25/2021   LAPAROSCOPIC APPENDECTOMY N/A 08/09/2021   Procedure: LAPAROSCOPIC CONVERTED TO OPEN ILEOSEECTOMY;  Surgeon: Ileana Roup, MD;  Location: [facility];  Service: General;  Laterality: N/A;     Allergies: Toradol [ketorolac tromethamine]  Medications: Prior to Admission medications   Medication Sig Start Date End Date Taking? Authorizing Provider  acetaminophen (TYLENOL) 500 MG tablet Take 1,000 mg by mouth every 8 (eight) hours as needed (pain).   Yes [provider]  acetaminophen-codeine (TYLENOL #3) 300-30 MG tablet Take 1 tablet by mouth every 4 (four) hours as needed. 01/21/22  Yes [provider]  BIOTIN PO Take 1 tablet by mouth every morning.   Yes [provider]  Cholecalciferol (VITAMIN D-3) 125 MCG (5000 UT) TABS Take 2 tablets by mouth daily.   Yes [provider]  Glucosamine-Chondroitin (OSTEO BI-FLEX REGULAR STRENGTH PO) Take 2 tablets by mouth daily.   Yes [provider]  lactobacillus acidophilus (BACID) TABS tablet Take 1 tablet by mouth daily. Probiotic with apple cider vinegar   Yes [provider]  lisinopril (ZESTRIL) 40 MG tablet Take 40 mg by mouth every morning.   Yes [provider]  methocarbamol (ROBAXIN) 500 MG tablet Take 1 tablet (500 mg total) by mouth every 6 (six) hours as needed for muscle spasms. 08/19/21  Yes Saverio Danker, PA-C  metoprolol succinate (TOPROL-XL) 50 MG 24 hr tablet Take 50 mg by mouth every morning. Take with or immediately following a meal.   Yes [provider]  naproxen sodium (ALEVE) 220 MG tablet Take 220 mg by mouth every 8 (eight)  hours as needed (pain).   Yes [provider]  Omega-3 Fatty Acids (OMEGA-3 PO) Take 1 capsule by mouth every morning.   Yes [provider]  oxyCODONE (OXY IR/ROXICODONE) 5 MG immediate release  tablet Take 1-2 tablets (5-10 mg total) by mouth every 6 (six) hours as needed. 08/19/21  Yes Saverio Danker, PA-C  ASPERCREME LIDOCAINE EX Apply 1 application topically at bedtime as needed (knee pain/arthritis). Patient not taking: Reported on 04/27/2022    [provider]  Multiple Vitamin (MULTIVITAMIN WITH MINERALS) TABS tablet Take 1 tablet by mouth every morning. Patient not taking: Reported on 04/27/2022    [provider]  PRESCRIPTION MEDICATION Inhale into the lungs See admin instructions. CPAP - use at night and whenever resting Patient not taking: Reported on 04/27/2022    [provider]     Vital Signs: BP (!) 183/73 (BP Location: Left Arm)   Pulse 62   Temp 98.3 F (36.8 C) (Oral)   Resp 18   Ht 5' 2.5" (1.588 m)   Wt 225 lb (102.1 kg)   SpO2 96%   BMI 40.50 kg/m   Physical Exam awake/alert; chest- sl dim BS bases; heart- RRR; abd- obese, soft,+BS, mildly tender LLQ; no sig LE edema  Imaging: CT ABDOMEN PELVIS W CONTRAST  Result Date: 05/03/2022 CLINICAL DATA:  Continued abdominal pain. History of diverticulitis with abscess. Tried to drain last week but unable to place a drain. Complication of diverticulitis suspected. EXAM: CT ABDOMEN AND PELVIS WITH CONTRAST TECHNIQUE: Multidetector CT imaging of the abdomen and pelvis was performed using the standard protocol following bolus administration of intravenous contrast. RADIATION DOSE REDUCTION: This exam was performed according to the departmental dose-optimization program which includes automated exposure control, adjustment of the mA and/or kV according to patient size and/or use of iterative reconstruction technique. CONTRAST:  183mL OMNIPAQUE IOHEXOL 300 MG/ML  SOLN COMPARISON:  CT abdomen and pelvis with contrast 08/25/2021, CT abdomen pelvis without contrast 04/27/2022, CT abdomen pelvis with contrast 04/27/2022; attempted CT-guided pelvic abscess drain placement 04/28/2022 (procedure was aborted  due to intervening viscus in prone position precluding access); CT abdomen and pelvis 08/09/2021 FINDINGS: Lower chest: Lateral peripheral basilar linear and ground-glass subsegmental atelectasis. Small left and trace right pleural effusions, new from 04/27/2022. There is again posterior left lower lobe curvilinear subsegmental atelectasis versus scarring. Heart size is moderately enlarged. There are dense coronary artery calcifications. Anterior middle lobe 4 mm subpleural nodule (axial series 4, image 17) and more inferior 4 mm anterior middle lobe subpleural nodule (axial image 21), unchanged from 08/09/2021 CT, now demonstrating 8 month stability. Hepatobiliary: There is diffuse decreased density again seen throughout the liver suggesting fatty infiltration. Anterior left hepatic lobe 19 mm fluid density cyst is unchanged from 08/09/2021. No intrahepatic or extrahepatic biliary ductal dilatation. No gallbladder wall thickening or calcified gallstones. Pancreas: Unremarkable. No pancreatic ductal dilatation or surrounding inflammatory changes. Spleen: Unchanged oval well-circumscribed low-density 9 mm lesion within the lateral aspect of the spleen (axial series 2, image 22) compared to 08/09/2021. Adrenals/Urinary Tract: Normal adrenals. The kidneys enhance uniformly and are symmetric in size. New bilateral nephroureteral stents with the proximal pigtails within the lateral renal pelves and the distal pigtails within the urinary bladder. Mild interval decrease in persistent mild-to-moderate left hydronephrosis. The previously seen left ureteropelvic junction 9 mm stone is now seen adjacent to the nephroureteral stent and measures up to 7 mm (axial series 2, image 46 and coronal series 5, image 58). Within the  posterior calyx of the lower pole of the right kidney there is an 8 mm stone (axial series image 29) that may represent posterior migration of a similarly sized stone previously seen within the urinary  pelvis. A second, 6 mm stone previously seen within the right urinary pelvis now may be newly seen within the more anterior calyx of the lower pole of the right kidney (axial series 2, image 53). Smaller right-sided renal stones are unchanged. A fluid density 2.4 cm anterior left midpole renal cyst is unchanged. No follow-up imaging is recommended. No stone is seen within the urinary bladder which is decompressed, limiting further evaluation. Stomach/Bowel: Oral contrast is seen as distal as the rectum. Moderate sigmoid diverticula are again seen. There is persistent moderate wall thickening of the mid and distal sigmoid colon. Just superior to the sigmoid colon there is a 3.4 x 2.7 x 2.5 cm (transverse by AP by craniocaudal, axial series 2, image 73) centrally low density and peripheral enhancing likely abscess, previously measuring approximately 3.4 x 4.4 x 3.5 cm when measured in a similar manner on 04/27/2022. This now appears to connect to a further enlarged rim enhancing abscess within the left hemipelvis just medial to the iliac vessels (axial series 2, image 79), which now measures up to approximately 11 x 4 x 7 cm (AP by transverse by craniocaudal), previously 10 x 2 x 6 cm. At the posterior midline aspect of the pelvis, just anterior to the rectum, there is now interval enlargement of a rim enhancing abscess measuring up to 3.1 cm (axial series 2, image 81). This appears connected to the left hemipelvis abscess. Postsurgical changes of proximal ascending colon resection and ileocolic anastomosis are again noted, related to remote perforated appendicitis. No dilated loops of bowel to indicate bowel obstruction. Vascular/Lymphatic: No abdominal aortic aneurysm. The major intra-aortic branch vessels are patent. Mild-to-moderate atherosclerotic calcifications. A splenule is again seen anterior to the left kidney. No mesenteric, retroperitoneal, or pelvic pathologically enlarged lymph nodes by CT criteria.  There are subcentimeter short axis left para-aortic lymph nodes just inferior to the left renal artery measuring up to 8 mm (axial series 2, image 39), minimally increased from 7 mm previously. These are likely reactive. Reproductive: The uterus is present. The left ovary again appears to be just superior and lateral to the abscess superior to the sigmoid colon. The left hemipelvis connected abscess does more diffusely contacts the inferior aspect of the left ovary (coronal series 5, image 68). Other: Postsurgical changes are again seen within the midline ventral abdominal wall. No pneumoperitoneum. No significant free fluid. Musculoskeletal: Moderate L4-5 and L5-S1 facet joint arthropathy. Minimal grade 1 anterolisthesis of L4 on L5, unchanged. IMPRESSION: Compared to 04/27/2022: 1. Interval placement of bilateral nephroureteral stents, in appropriate position. Slight improvement in now mild-to-moderate left hydronephrosis. Left ureteropelvic junction 7 mm stone is seen previously a 9 mm stone was seen. 2. Right lower pole 8 mm and 6 mm caliceal stones likely have mildly moved from the more renal pelvis location previously. 3. Sigmoid diverticulosis with inflammatory wall thickening again indicating diverticulitis. Stable to minimally decreased size of an abscess just superior to the sigmoid colon, however this connects to a now larger left hemipelvis where enhancing abscess and a newly developing small posterior midline pelvis rim enhancing abscess, as described above. The left hemipelvis abscess now more diffusely contacts the inferior aspect of the left ovary. 4. Small left and trace right pleural effusions, new from prior. 5. Two 4-mm right middle  lobe pulmonary nodules are unchanged from oldest available CT 08/09/2021. No follow-up needed if patient is low-risk (and has no known or suspected primary neoplasm). Non-contrast chest CT can be considered in 12 months if patient is high-risk. This recommendation  follows the consensus statement: Guidelines for Management of Incidental Pulmonary Nodules Detected on CT Images: From the Fleischner Society 2017; Radiology 2017; 284:228-243. Electronically Signed   By: Yvonne Kendall M.D.   On: 05/03/2022 12:22    Labs:  CBC: Recent Labs    04/28/22 0431 04/29/22 0427 04/30/22 0543 05/02/22 0442  WBC 16.9* 17.6* 15.2* 14.5*  HGB 10.3* 9.4* 9.2* 9.2*  HCT 33.3* 31.0* 31.0* 30.8*  PLT 484* 481* 463* 505*    COAGS: Recent Labs    08/17/21 1010 04/28/22 0431  INR 1.0 1.2    BMP: Recent Labs    04/27/22 0753 04/28/22 0431 04/29/22 0427 04/30/22 0543  NA 136 137 139 138  K 4.2 4.2 3.7 3.6  CL 105 106 109 109  CO2 23 22 23 24   GLUCOSE 195* 203* 158* 138*  BUN 16 19 22 18   CALCIUM 9.7 9.4 9.1 8.9  CREATININE 0.91 0.96 0.95 0.92  GFRNONAA >60 >60 >60 >60    LIVER FUNCTION TESTS: Recent Labs    08/09/21 0820 04/27/22 0753 04/28/22 0431  BILITOT 0.6 0.6 0.6  AST 16 18 17   ALT 14 17 18   ALKPHOS 87 85 77  PROT 6.9 7.2 7.3  ALBUMIN 3.6 3.2* 3.0*    Assessment and Plan: Patient known to IR service from drainage of left pelvic abscess on 08/17/2021 following acute perforated appendicitis and lap converted to open ileocecectomy.  Drain was removed on 08/25/2021 following negative fistula study. She was [facility] on 11/8 with persistent abdominal/flank pain, nausea, frequent urination/cloudy urine;  pt was evaluated for poss pelvic abscess drain placement on 11/9, however no safe perc window available; now with latest CT showing:   1. Interval placement of bilateral nephroureteral stents, in appropriate position. Slight improvement in now mild-to-moderate left hydronephrosis. Left ureteropelvic junction 7 mm stone is seen previously a 9 mm stone was seen. 2. Right lower pole 8 mm and 6 mm caliceal stones likely have mildly moved from the more renal pelvis location previously. 3. Sigmoid diverticulosis with  inflammatory wall thickening again indicating diverticulitis. Stable to minimally decreased size of an abscess just superior to the sigmoid colon, however this connects to a now larger left hemipelvis where enhancing abscess and a newly developing small posterior midline pelvis rim enhancing abscess, as described above. The left hemipelvis abscess now more diffusely contacts the inferior aspect of the left ovary. 4. Small left and trace right pleural effusions, new from prior. 5. Two 4-mm right middle lobe pulmonary nodules are unchanged from oldest available CT 08/09/2021. No follow-up needed if patient is low-risk (and has no known or suspected primary neoplasm). Non-contrast chest CT can be considered in 12 months if patient is high-risk  Request received again from [facility] to revaluate pt for pelvic drain placement; images were reviewed by Dr. Earleen Newport.Risks and benefits discussed with the patient/spouse including bleeding, infection, damage to adjacent structures, bowel perforation/fistula connection, and sepsis or inability to place drain.   All of the patient's questions were answered, patient is agreeable to proceed. Consent signed and in chart. Procedure scheduled for today.   Electronically Signed: D. Rowe Robert, PA-C 05/03/2022, 3:50 PM   I spent a total of 20 minutes at the the patient's  bedside AND on the patient's hospital floor or unit, greater than 50% of which was counseling/coordinating care for pelvic abscess drain placement    Patient ID: Heidi Peterson, female   DOB: [birth year], 71 y.o.   MRN: [id number]

## 2022-05-03 NOTE — Plan of Care (Signed)

## 2022-05-03 NOTE — Care Management Important Message (Signed)
Important Message  Patient Details  Name: Heidi Peterson MRN: [id number] Date of Birth: 1950/07/15   Medicare Important Message Given:  Yes     Mardene Sayer 05/03/2022, 3:13 PM

## 2022-05-03 NOTE — Progress Notes (Signed)
[facility] Progress Note  6 Days Post-Op  Subjective: Still having some LLQ abdominal pain, worse at night. Pain is not worse with walking or palpation. Tolerating liquids without n/v. Having bowel function. Husband at bedside as well.   Objective: Vital signs in last 24 hours: Temp:  [97.3 F (36.3 C)] 97.3 F (36.3 C) (11/14 0516) Pulse Rate:  [58] 58 (11/14 0516) Resp:  [20] 20 (11/14 0516) BP: (152)/(65) 152/65 (11/14 0516) SpO2:  [97 %] 97 % (11/14 0516) Last BM Date : 05/02/22  Intake/Output from previous day: 11/13 0701 - 11/14 0700 In: 55.8 [IV Piggyback:55.8] Out: 650 [Urine:650] Intake/Output this shift: Total I/O In: -  Out: 200 [Urine:200]  PE: Gen:  Alert, NAD, pleasant Abd: soft, ND, +BS, lap and midline incisions well healed.  Minimally tender in the left lower quadrant and suprapubic region  Lab Results:  Recent Labs    05/02/22 0442  WBC 14.5*  HGB 9.2*  HCT 30.8*  PLT 505*    BMET No results for input(s): "NA", "K", "CL", "CO2", "GLUCOSE", "BUN", "CREATININE", "CALCIUM" in the last 72 hours.  PT/INR No results for input(s): "LABPROT", "INR" in the last 72 hours. CMP     Component Value Date/Time   NA 138 04/30/2022 0543   K 3.6 04/30/2022 0543   CL 109 04/30/2022 0543   CO2 24 04/30/2022 0543   GLUCOSE 138 (H) 04/30/2022 0543   BUN 18 04/30/2022 0543   CREATININE 0.92 04/30/2022 0543   CALCIUM 8.9 04/30/2022 0543   PROT 7.3 04/28/2022 0431   ALBUMIN 3.0 (L) 04/28/2022 0431   AST 17 04/28/2022 0431   ALT 18 04/28/2022 0431   ALKPHOS 77 04/28/2022 0431   BILITOT 0.6 04/28/2022 0431   GFRNONAA >60 04/30/2022 0543   Lipase     Component Value Date/Time   LIPASE 26 04/27/2022 0753       Studies/Results: No results found.  Anti-infectives: Anti-infectives (From admission, onward)    Start     Dose/Rate Route Frequency Ordered Stop   04/27/22 1800  piperacillin-tazobactam (ZOSYN) IVPB 3.375 g        3.375  g 12.5 mL/hr over 240 Minutes Intravenous Every 8 hours 04/27/22 1452     04/27/22 1200  piperacillin-tazobactam (ZOSYN) IVPB 3.375 g        3.375 g 100 mL/hr over 30 Minutes Intravenous  Once 04/27/22 1157 04/27/22 1309        Assessment/Plan Sigmoid diverticulitis with multiple abscesses - 1st bout, never had a colonoscopy - CT 11/8 shows acute sigmoid diverticulitis with 2 loculated fluid collections each measuring less than 4 cm in size immediately adjacent to the sigmoid colon suggesting possible pericolic abscesses; there is a linear 8.6 x 2.1 cm loculated fluid collection in left side of pelvis medial to the iliac vessels with interval increase suggesting loculated serous fluid collection or another developing abscess; no pneumoperitoneum - Not amenable to IR drainage 11/9  - Exam stable and WBC slightly down 11/13. Continue IV antibiotics.  - repeat CT today    ID - zosyn 11/8 FEN - IVF, FLD VTE - ok for chemical dvt ppx from surgical standpoint Foley - none   R renal calculi, L UPJ stone with hydronephrosis, UTI - s/p bilateral ureteral stent placement 11/8, urology following HTN Obesity BMI 40.50   I reviewed hospitalist notes, last 24 h vitals and pain scores, last 48 h intake and output, last 24 h labs and trends.    LOS:  6 days    Juliet Rude, [facility] 05/03/2022, 10:30 AM Please see Amion for pager number during day hours 7:00am-4:30pm

## 2022-05-03 NOTE — Procedures (Addendum)
Interventional Radiology Procedure Note  Procedure: Image guided drain placement, left trans-gluteal.  39F pigtail drain. ~100cc purulent material aspirated  Complications: None  EBL: None Sample: Culture sent  Recommendations: - Routine drain care, with sterile flushes, record output - follow up Cx - routine wound care - ok to advance diet per primary order  Signed,  Yvone Neu. Loreta Ave, DO

## 2022-05-04 DIAGNOSIS — R109 Unspecified abdominal pain: Secondary | ICD-10-CM

## 2022-05-04 DIAGNOSIS — N200 Calculus of kidney: Secondary | ICD-10-CM

## 2022-05-04 LAB — COMPREHENSIVE METABOLIC PANEL
ALT: 18 U/L (ref 0–44)
AST: 16 U/L (ref 15–41)
Albumin: 2.3 g/dL — ABNORMAL LOW (ref 3.5–5.0)
Alkaline Phosphatase: 80 U/L (ref 38–126)
Anion gap: 7 (ref 5–15)
BUN: 14 mg/dL (ref 8–23)
CO2: 28 mmol/L (ref 22–32)
Calcium: 9.2 mg/dL (ref 8.9–10.3)
Chloride: 104 mmol/L (ref 98–111)
Creatinine, Ser: 0.81 mg/dL (ref 0.44–1.00)
GFR, Estimated: >60 mL/min (ref >60)
Glucose, Bld: 114 mg/dL — ABNORMAL HIGH (ref 70–99)
Potassium: 3.3 mmol/L — ABNORMAL LOW (ref 3.5–5.1)
Sodium: 139 mmol/L (ref 135–145)
Total Bilirubin: 0.6 mg/dL (ref 0.3–1.2)
Total Protein: 6 g/dL — ABNORMAL LOW (ref 6.5–8.1)

## 2022-05-04 LAB — CBC
HCT: 30.2 % — ABNORMAL LOW (ref 36.0–46.0)
Hemoglobin: 9 g/dL — ABNORMAL LOW (ref 12.0–15.0)
MCH: 22.7 pg — ABNORMAL LOW (ref 26.0–34.0)
MCHC: 29.8 g/dL — ABNORMAL LOW (ref 30.0–36.0)
MCV: 76.3 fL — ABNORMAL LOW (ref 80.0–100.0)
Platelets: 506 10*3/uL — ABNORMAL HIGH (ref 150–400)
RBC: 3.96 MIL/uL (ref 3.87–5.11)
RDW: 16.3 % — ABNORMAL HIGH (ref 11.5–15.5)
WBC: 10.3 10*3/uL (ref 4.0–10.5)
nRBC: 0 % (ref 0.0–0.2)

## 2022-05-04 MED ORDER — POTASSIUM CHLORIDE CRYS ER 20 MEQ PO TBCR: 40.0000 meq | EXTENDED_RELEASE_TABLET | Freq: Once | ORAL | Status: DC

## 2022-05-04 MED ORDER — ACETAMINOPHEN 325 MG PO TABS
650.0000 mg | ORAL_TABLET | Freq: Four times a day (QID) | ORAL | Status: DC | PRN
  Administered 2022-05-04: 650 mg via ORAL
  Filled 2022-05-04: qty 2

## 2022-05-04 MED ORDER — POTASSIUM CHLORIDE CRYS ER 20 MEQ PO TBCR
60.0000 meq | EXTENDED_RELEASE_TABLET | Freq: Once | ORAL | Status: AC
  Administered 2022-05-04: 60 meq via ORAL
  Filled 2022-05-04: qty 3

## 2022-05-04 NOTE — Progress Notes (Signed)
  Progress Note   Patient: Heidi Peterson LEX:[id number] DOB: 1951/04/19 DOA: 04/27/2022     7 DOS: the patient was seen and examined on 05/04/2022   Brief hospital course: Heidi Peterson is a 71 y.o. female with a history of hypertension who presented secondary to abdominal and flank pain and found to have a left obstructive UPJ stone and diverticulitis with abscess. Urology and General surgery consulted. Patient underwent bilateral ureteral stent placement on 11/8. Attempt to place drain into diverticular abscess unsuccessful. Repeat CT abdomen/pelvis shows evidence of new abscess.  Assessment and Plan: * Diverticulitis of intestine with abscess Had been continued on empiric zosyn. General surgery following -Pt now s/p drain placed by IR on 11/14  -WBC has normalized -Per General Surgery, recs to cont current course, may consider d/c within the next 24-48hrs pending ability to care for drains and clinical progress   Complicated UTI (urinary tract infection) Associated obstructive uropathy with likely infected stone. Urine culture significant for pan-sensitive E. Coli. Patient empirically on Zosyn IV. -Continue Zosyn IV secondary to intraabdominal infection   Obstructive uropathy Secondary to left UPJ stone with associated bilateral non obstructing ureteral stones requiring bilateral ureteral stent placement by urology on 11/8. Creatinine stable.   Obesity, Class III, BMI 40-49.9 (morbid obesity) (HCC) Estimated body mass index is 40.5 kg/m as calculated from the following:   Height as of this encounter: 5' 2.5" (1.588 m).   Weight as of this encounter: 102.1 kg.   HTN (hypertension) -Continue metoprolol succinate   Hyperglycemia Hemoglobin A1C of 6.5%. Discussed with patient and she prefers lifestyle changes. Will allow follow-up with primary care physician for repeat hemoglobin A1C to confirm diagnosis of diabetes at that time.   Microcytic anemia Chronic.  Stable. Iron panel with possible iron deficiency, although ferritin is normal. -Iron supplementation on discharge  Hypokalemia -Will replace      Subjective: Reports feeling better today.   Physical Exam: Vitals:   05/03/22 1827 05/03/22 1914 05/04/22 0007 05/04/22 0413  BP: (!) 128/57 (!) 173/51 (!) 143/64 135/64  Pulse: (!) 53 (!) 58 61 (!) 46  Resp: 18 20 18 16   Temp: 98.8 F (37.1 C) 98.9 F (37.2 C) 98.7 F (37.1 C) 98.9 F (37.2 C)  TempSrc: Oral Oral Oral Oral  SpO2: 95% 97% 96% 95%  Weight:      Height:       General exam: Awake, laying in bed, in nad Respiratory system: Normal respiratory effort, no wheezing Cardiovascular system: regular rate, s1, s2 Gastrointestinal system: Soft, nondistended, positive BS, drain in place Central nervous system: CN2-12 grossly intact, strength intact Extremities: Perfused, no clubbing Skin: Normal skin turgor, no notable skin lesions seen Psychiatry: Mood normal // no visual hallucinations   Data Reviewed:  Labs reviewed: Na 139, K 3.3, Cr 0.81, Hgb 9.0, WBC 10.3  Family Communication: Pt in room, family is at bedside  Disposition: Status is: Inpatient Remains inpatient appropriate because: severity of illness  Planned Discharge Destination: Home    Author: , MD 05/04/2022 4:29 PM  For on call review [email or web address].

## 2022-05-04 NOTE — Progress Notes (Signed)
Referring Physician(s): Blackman,D  Supervising Physician: Ruthann Cancer  Patient Status:  [facility] - In-pt  Chief Complaint:  Left lower abdominal pain, diverticular abscess  Subjective: Pt feeling better since pelvic drain placed yesterday; has some expected discomfort at left TG drain site; denies LLQ pain,fever, N/V   Allergies: Toradol [ketorolac tromethamine]  Medications: Prior to Admission medications   Medication Sig Start Date End Date Taking? Authorizing Provider  acetaminophen (TYLENOL) 500 MG tablet Take 1,000 mg by mouth every 8 (eight) hours as needed (pain).   Yes [provider]  acetaminophen-codeine (TYLENOL #3) 300-30 MG tablet Take 1 tablet by mouth every 4 (four) hours as needed. 01/21/22  Yes [provider]  BIOTIN PO Take 1 tablet by mouth every morning.   Yes [provider]  Cholecalciferol (VITAMIN D-3) 125 MCG (5000 UT) TABS Take 2 tablets by mouth daily.   Yes [provider]  Glucosamine-Chondroitin (OSTEO BI-FLEX REGULAR STRENGTH PO) Take 2 tablets by mouth daily.   Yes [provider]  lactobacillus acidophilus (BACID) TABS tablet Take 1 tablet by mouth daily. Probiotic with apple cider vinegar   Yes [provider]  lisinopril (ZESTRIL) 40 MG tablet Take 40 mg by mouth every morning.   Yes [provider]  methocarbamol (ROBAXIN) 500 MG tablet Take 1 tablet (500 mg total) by mouth every 6 (six) hours as needed for muscle spasms. 08/19/21  Yes Saverio Danker, PA-C  metoprolol succinate (TOPROL-XL) 50 MG 24 hr tablet Take 50 mg by mouth every morning. Take with or immediately following a meal.   Yes [provider]  naproxen sodium (ALEVE) 220 MG tablet Take 220 mg by mouth every 8 (eight) hours as needed (pain).   Yes [provider]  Omega-3 Fatty Acids (OMEGA-3 PO) Take 1 capsule by mouth every morning.   Yes [provider]  oxyCODONE (OXY IR/ROXICODONE) 5 MG  immediate release tablet Take 1-2 tablets (5-10 mg total) by mouth every 6 (six) hours as needed. 08/19/21  Yes Saverio Danker, PA-C  ASPERCREME LIDOCAINE EX Apply 1 application topically at bedtime as needed (knee pain/arthritis). Patient not taking: Reported on 04/27/2022    [provider]  Multiple Vitamin (MULTIVITAMIN WITH MINERALS) TABS tablet Take 1 tablet by mouth every morning. Patient not taking: Reported on 04/27/2022    [provider]  PRESCRIPTION MEDICATION Inhale into the lungs See admin instructions. CPAP - use at night and whenever resting Patient not taking: Reported on 04/27/2022    [provider]     Vital Signs: BP 135/64 (BP Location: Right Arm)   Pulse (!) 46   Temp 98.9 F (37.2 C) (Oral)   Resp 16   Ht 5' 2.5" (1.588 m)   Wt 225 lb (102.1 kg)   SpO2 95%   BMI 40.50 kg/m   Physical Exam awake/alert; left TG drain intact, insertion site ok, mildly tender, OP 140 cc serosang fluid; drain flushed this am  Imaging: CT GUIDED VISCERAL FLUID DRAIN BY PERC CATH  Result Date: 05/03/2022 INDICATION: 71 year old female with pelvic abscess presents for drainage EXAM: CT GUIDED DRAINAGE OF PELVIC ABSCESS MEDICATIONS: The patient is currently admitted to the hospital and receiving intravenous antibiotics. The antibiotics were administered within an appropriate time frame prior to the initiation of the procedure. ANESTHESIA/SEDATION: 2.0 mg IV Versed 100 mcg IV Fentanyl Moderate Sedation Time:  18 minutes The patient was continuously monitored during the procedure by the interventional radiology nurse under my direct supervision.  COMPLICATIONS: None TECHNIQUE: Informed written consent was obtained from the patient after a thorough discussion of the procedural risks, benefits and alternatives. All questions were addressed. Maximal Sterile Barrier Technique was utilized including caps, mask, sterile gowns, sterile gloves, sterile drape, hand hygiene and  skin antiseptic. A timeout was performed prior to the initiation of the procedure. PROCEDURE: Patient was positioned prone on the CT gantry table. Scout CT acquired for planning purposes. The left gluteal region was prepped with Chlorhexidine in a sterile fashion, and a sterile drape was applied covering the operative field. A sterile gown and sterile gloves were used for the procedure. Local anesthesia was provided with 1% Lidocaine. Once the patient was anesthetized, small stab incision was made with 11 blade scalpel. 18 gauge 20 cm trocar needle was then advanced with interval CT imaging guidance in a left transgluteal, caudal-cranial approach into the abscess in the left pelvis. Once we confirmed needle tip position a wire was advanced into the collection. Modified Seldinger technique was then used to place a 10 [place] drain into the collection. Approximately 100 cc of purulent material aspirated. Drain was attached to bulb suction and sutured in position. Patient tolerated the procedure well and remained hemodynamically stable throughout. No complications were encountered and no significant blood loss. FINDINGS: Scout CT demonstrates similar size of left-sided pelvic abscess. A left transgluteal approach caudal-cranial direction seems to be the only feasible drain approach. Final image demonstrates the 10 French drain formed within the collection. Proximally 100 cc of frankly purulent material aspirated. IMPRESSION: Status post CT-guided drainage of left pelvic abscess. Signed, Dulcy Fanny. Nadene Rubins, RPVI [facility] Electronically Signed   By: Corrie Mckusick D.O.   On: 05/03/2022 17:06   CT ABDOMEN PELVIS W CONTRAST  Result Date: 05/03/2022 CLINICAL DATA:  Continued abdominal pain. History of diverticulitis with abscess. Tried to drain last week but unable to place a drain. Complication of diverticulitis suspected. EXAM: CT ABDOMEN AND PELVIS  WITH CONTRAST TECHNIQUE: Multidetector CT imaging of the abdomen and pelvis was performed using the standard protocol following bolus administration of intravenous contrast. RADIATION DOSE REDUCTION: This exam was performed according to the departmental dose-optimization program which includes automated exposure control, adjustment of the mA and/or kV according to patient size and/or use of iterative reconstruction technique. CONTRAST:  120mL OMNIPAQUE IOHEXOL 300 MG/ML  SOLN COMPARISON:  CT abdomen and pelvis with contrast 08/25/2021, CT abdomen pelvis without contrast 04/27/2022, CT abdomen pelvis with contrast 04/27/2022; attempted CT-guided pelvic abscess drain placement 04/28/2022 (procedure was aborted due to intervening viscus in prone position precluding access); CT abdomen and pelvis 08/09/2021 FINDINGS: Lower chest: Lateral peripheral basilar linear and ground-glass subsegmental atelectasis. Small left and trace right pleural effusions, new from 04/27/2022. There is again posterior left lower lobe curvilinear subsegmental atelectasis versus scarring. Heart size is moderately enlarged. There are dense coronary artery calcifications. Anterior middle lobe 4 mm subpleural nodule (axial series 4, image 17) and more inferior 4 mm anterior middle lobe subpleural nodule (axial image 21), unchanged from 08/09/2021 CT, now demonstrating 8 month stability. Hepatobiliary: There is diffuse decreased density again seen throughout the liver suggesting fatty infiltration. Anterior left hepatic lobe 19 mm fluid density cyst is unchanged from 08/09/2021. No intrahepatic or extrahepatic biliary ductal dilatation. No gallbladder wall thickening or calcified gallstones. Pancreas: Unremarkable. No pancreatic ductal dilatation or surrounding inflammatory changes. Spleen: Unchanged oval well-circumscribed low-density 9 mm lesion within the lateral aspect of the spleen (axial series 2,  image 22) compared to 08/09/2021.  Adrenals/Urinary Tract: Normal adrenals. The kidneys enhance uniformly and are symmetric in size. New bilateral nephroureteral stents with the proximal pigtails within the lateral renal pelves and the distal pigtails within the urinary bladder. Mild interval decrease in persistent mild-to-moderate left hydronephrosis. The previously seen left ureteropelvic junction 9 mm stone is now seen adjacent to the nephroureteral stent and measures up to 7 mm (axial series 2, image 46 and coronal series 5, image 58). Within the posterior calyx of the lower pole of the right kidney there is an 8 mm stone (axial series image 29) that may represent posterior migration of a similarly sized stone previously seen within the urinary pelvis. A second, 6 mm stone previously seen within the right urinary pelvis now may be newly seen within the more anterior calyx of the lower pole of the right kidney (axial series 2, image 53). Smaller right-sided renal stones are unchanged. A fluid density 2.4 cm anterior left midpole renal cyst is unchanged. No follow-up imaging is recommended. No stone is seen within the urinary bladder which is decompressed, limiting further evaluation. Stomach/Bowel: Oral contrast is seen as distal as the rectum. Moderate sigmoid diverticula are again seen. There is persistent moderate wall thickening of the mid and distal sigmoid colon. Just superior to the sigmoid colon there is a 3.4 x 2.7 x 2.5 cm (transverse by AP by craniocaudal, axial series 2, image 73) centrally low density and peripheral enhancing likely abscess, previously measuring approximately 3.4 x 4.4 x 3.5 cm when measured in a similar manner on 04/27/2022. This now appears to connect to a further enlarged rim enhancing abscess within the left hemipelvis just medial to the iliac vessels (axial series 2, image 79), which now measures up to approximately 11 x 4 x 7 cm (AP by transverse by craniocaudal), previously 10 x 2 x 6 cm. At the posterior  midline aspect of the pelvis, just anterior to the rectum, there is now interval enlargement of a rim enhancing abscess measuring up to 3.1 cm (axial series 2, image 81). This appears connected to the left hemipelvis abscess. Postsurgical changes of proximal ascending colon resection and ileocolic anastomosis are again noted, related to remote perforated appendicitis. No dilated loops of bowel to indicate bowel obstruction. Vascular/Lymphatic: No abdominal aortic aneurysm. The major intra-aortic branch vessels are patent. Mild-to-moderate atherosclerotic calcifications. A splenule is again seen anterior to the left kidney. No mesenteric, retroperitoneal, or pelvic pathologically enlarged lymph nodes by CT criteria. There are subcentimeter short axis left para-aortic lymph nodes just inferior to the left renal artery measuring up to 8 mm (axial series 2, image 39), minimally increased from 7 mm previously. These are likely reactive. Reproductive: The uterus is present. The left ovary again appears to be just superior and lateral to the abscess superior to the sigmoid colon. The left hemipelvis connected abscess does more diffusely contacts the inferior aspect of the left ovary (coronal series 5, image 68). Other: Postsurgical changes are again seen within the midline ventral abdominal wall. No pneumoperitoneum. No significant free fluid. Musculoskeletal: Moderate L4-5 and L5-S1 facet joint arthropathy. Minimal grade 1 anterolisthesis of L4 on L5, unchanged. IMPRESSION: Compared to 04/27/2022: 1. Interval placement of bilateral nephroureteral stents, in appropriate position. Slight improvement in now mild-to-moderate left hydronephrosis. Left ureteropelvic junction 7 mm stone is seen previously a 9 mm stone was seen. 2. Right lower pole 8 mm and 6 mm caliceal stones likely have mildly moved from the more renal pelvis  location previously. 3. Sigmoid diverticulosis with inflammatory wall thickening again indicating  diverticulitis. Stable to minimally decreased size of an abscess just superior to the sigmoid colon, however this connects to a now larger left hemipelvis where enhancing abscess and a newly developing small posterior midline pelvis rim enhancing abscess, as described above. The left hemipelvis abscess now more diffusely contacts the inferior aspect of the left ovary. 4. Small left and trace right pleural effusions, new from prior. 5. Two 4-mm right middle lobe pulmonary nodules are unchanged from oldest available CT 08/09/2021. No follow-up needed if patient is low-risk (and has no known or suspected primary neoplasm). Non-contrast chest CT can be considered in 12 months if patient is high-risk. This recommendation follows the consensus statement: Guidelines for Management of Incidental Pulmonary Nodules Detected on CT Images: From the Fleischner Society 2017; Radiology 2017; 284:228-243. Electronically Signed   By: Yvonne Kendall M.D.   On: 05/03/2022 12:22    Labs:  CBC: Recent Labs    04/29/22 0427 04/30/22 0543 05/02/22 0442 05/04/22 0434  WBC 17.6* 15.2* 14.5* 10.3  HGB 9.4* 9.2* 9.2* 9.0*  HCT 31.0* 31.0* 30.8* 30.2*  PLT 481* 463* 505* 506*    COAGS: Recent Labs    08/17/21 1010 04/28/22 0431  INR 1.0 1.2    BMP: Recent Labs    04/28/22 0431 04/29/22 0427 04/30/22 0543 05/04/22 0434  NA 137 139 138 139  K 4.2 3.7 3.6 3.3*  CL 106 109 109 104  CO2 22 23 24 28   GLUCOSE 203* 158* 138* 114*  BUN 19 22 18 14   CALCIUM 9.4 9.1 8.9 9.2  CREATININE 0.96 0.95 0.92 0.81  GFRNONAA >60 >60 >60 >60    LIVER FUNCTION TESTS: Recent Labs    08/09/21 0820 04/27/22 0753 04/28/22 0431 05/04/22 0434  BILITOT 0.6 0.6 0.6 0.6  AST 16 18 17 16   ALT 14 17 18 18   ALKPHOS 87 85 77 80  PROT 6.9 7.2 7.3 6.0*  ALBUMIN 3.6 3.2* 3.0* 2.3*    Assessment and Plan: Pt with hx left pelvic/diverticular abscess; s/p left TG drain placement 11/14 (10 fr to JP );afebrile, WBC nl, hgb  stable, creat nl, K 3.3, drain fl cx neg to date; cont drain /tid saline flushes, close output monitoring, lab checks; once drain output minimal or if WBC increases obtain f/u CT   Electronically Signed: D. Rowe Robert, PA-C 05/04/2022, 9:23 AM   I spent a total of 15 Minutes at the the patient's bedside AND on the patient's hospital floor or unit, greater than 50% of which was counseling/coordinating care for pelvic abscess drain    Patient ID: Heidi Peterson, female   DOB: 16-Jan-1951, 71 y.o.   MRN: [id number]

## 2022-05-04 NOTE — Progress Notes (Signed)
[facility] Progress Note  7 Days Post-Op  Subjective: Some soreness at TG drain site but overall feeling well. Tolerating FLD. Having bowel function  Objective: Vital signs in last 24 hours: Temp:  [97.3 F (36.3 C)-98.9 F (37.2 C)] 98.9 F (37.2 C) (11/15 0413) Pulse Rate:  [46-69] 46 (11/15 0413) Resp:  [16-22] 16 (11/15 0413) BP: (128-183)/(51-86) 135/64 (11/15 0413) SpO2:  [95 %-100 %] 95 % (11/15 0413) Last BM Date : 05/03/22  Intake/Output from previous day: 11/14 0701 - 11/15 0700 In: 495 [P.O.:240; IV Piggyback:250] Out: 665 [Urine:525; Drains:140] Intake/Output this shift: No intake/output data recorded.  PE: Gen:  Alert, NAD, pleasant Abd: soft, ND, Minimally tender in the LLQ, TG drain with seropurulent fluid  Lab Results:  Recent Labs    05/02/22 0442 05/04/22 0434  WBC 14.5* 10.3  HGB 9.2* 9.0*  HCT 30.8* 30.2*  PLT 505* 506*    BMET Recent Labs    05/04/22 0434  NA 139  K 3.3*  CL 104  CO2 28  GLUCOSE 114*  BUN 14  CREATININE 0.81  CALCIUM 9.2    PT/INR No results for input(s): "LABPROT", "INR" in the last 72 hours. CMP     Component Value Date/Time   NA 139 05/04/2022 0434   K 3.3 (L) 05/04/2022 0434   CL 104 05/04/2022 0434   CO2 28 05/04/2022 0434   GLUCOSE 114 (H) 05/04/2022 0434   BUN 14 05/04/2022 0434   CREATININE 0.81 05/04/2022 0434   CALCIUM 9.2 05/04/2022 0434   PROT 6.0 (L) 05/04/2022 0434   ALBUMIN 2.3 (L) 05/04/2022 0434   AST 16 05/04/2022 0434   ALT 18 05/04/2022 0434   ALKPHOS 80 05/04/2022 0434   BILITOT 0.6 05/04/2022 0434   GFRNONAA >60 05/04/2022 0434   Lipase     Component Value Date/Time   LIPASE 26 04/27/2022 0753       Studies/Results: CT GUIDED VISCERAL FLUID DRAIN BY PERC CATH  Result Date: 05/03/2022 INDICATION: 71 year old female with pelvic abscess presents for drainage EXAM: CT GUIDED DRAINAGE OF PELVIC ABSCESS MEDICATIONS: The patient is currently admitted to the  hospital and receiving intravenous antibiotics. The antibiotics were administered within an appropriate time frame prior to the initiation of the procedure. ANESTHESIA/SEDATION: 2.0 mg IV Versed 100 mcg IV Fentanyl Moderate Sedation Time:  18 minutes The patient was continuously monitored during the procedure by the interventional radiology nurse under my direct supervision. COMPLICATIONS: None TECHNIQUE: Informed written consent was obtained from the patient after a thorough discussion of the procedural risks, benefits and alternatives. All questions were addressed. Maximal Sterile Barrier Technique was utilized including caps, mask, sterile gowns, sterile gloves, sterile drape, hand hygiene and skin antiseptic. A timeout was performed prior to the initiation of the procedure. PROCEDURE: Patient was positioned prone on the CT gantry table. Scout CT acquired for planning purposes. The left gluteal region was prepped with Chlorhexidine in a sterile fashion, and a sterile drape was applied covering the operative field. A sterile gown and sterile gloves were used for the procedure. Local anesthesia was provided with 1% Lidocaine. Once the patient was anesthetized, small stab incision was made with 11 blade scalpel. 18 gauge 20 cm trocar needle was then advanced with interval CT imaging guidance in a left transgluteal, caudal-cranial approach into the abscess in the left pelvis. Once we confirmed needle tip position a wire was advanced into the collection. Modified Seldinger technique was then used to place a 10 [place] drain  into the collection. Approximately 100 cc of purulent material aspirated. Drain was attached to bulb suction and sutured in position. Patient tolerated the procedure well and remained hemodynamically stable throughout. No complications were encountered and no significant blood loss. FINDINGS: Scout CT demonstrates similar size of left-sided pelvic abscess. A left transgluteal approach caudal-cranial  direction seems to be the only feasible drain approach. Final image demonstrates the 10 French drain formed within the collection. Proximally 100 cc of frankly purulent material aspirated. IMPRESSION: Status post CT-guided drainage of left pelvic abscess. Signed, Dulcy Fanny. Nadene Rubins, RPVI [facility] Electronically Signed   By: Corrie Mckusick D.O.   On: 05/03/2022 17:06   CT ABDOMEN PELVIS W CONTRAST  Result Date: 05/03/2022 CLINICAL DATA:  Continued abdominal pain. History of diverticulitis with abscess. Tried to drain last week but unable to place a drain. Complication of diverticulitis suspected. EXAM: CT ABDOMEN AND PELVIS WITH CONTRAST TECHNIQUE: Multidetector CT imaging of the abdomen and pelvis was performed using the standard protocol following bolus administration of intravenous contrast. RADIATION DOSE REDUCTION: This exam was performed according to the departmental dose-optimization program which includes automated exposure control, adjustment of the mA and/or kV according to patient size and/or use of iterative reconstruction technique. CONTRAST:  138mL OMNIPAQUE IOHEXOL 300 MG/ML  SOLN COMPARISON:  CT abdomen and pelvis with contrast 08/25/2021, CT abdomen pelvis without contrast 04/27/2022, CT abdomen pelvis with contrast 04/27/2022; attempted CT-guided pelvic abscess drain placement 04/28/2022 (procedure was aborted due to intervening viscus in prone position precluding access); CT abdomen and pelvis 08/09/2021 FINDINGS: Lower chest: Lateral peripheral basilar linear and ground-glass subsegmental atelectasis. Small left and trace right pleural effusions, new from 04/27/2022. There is again posterior left lower lobe curvilinear subsegmental atelectasis versus scarring. Heart size is moderately enlarged. There are dense coronary artery calcifications. Anterior middle lobe 4 mm subpleural nodule (axial series 4, image 17) and more  inferior 4 mm anterior middle lobe subpleural nodule (axial image 21), unchanged from 08/09/2021 CT, now demonstrating 8 month stability. Hepatobiliary: There is diffuse decreased density again seen throughout the liver suggesting fatty infiltration. Anterior left hepatic lobe 19 mm fluid density cyst is unchanged from 08/09/2021. No intrahepatic or extrahepatic biliary ductal dilatation. No gallbladder wall thickening or calcified gallstones. Pancreas: Unremarkable. No pancreatic ductal dilatation or surrounding inflammatory changes. Spleen: Unchanged oval well-circumscribed low-density 9 mm lesion within the lateral aspect of the spleen (axial series 2, image 22) compared to 08/09/2021. Adrenals/Urinary Tract: Normal adrenals. The kidneys enhance uniformly and are symmetric in size. New bilateral nephroureteral stents with the proximal pigtails within the lateral renal pelves and the distal pigtails within the urinary bladder. Mild interval decrease in persistent mild-to-moderate left hydronephrosis. The previously seen left ureteropelvic junction 9 mm stone is now seen adjacent to the nephroureteral stent and measures up to 7 mm (axial series 2, image 46 and coronal series 5, image 58). Within the posterior calyx of the lower pole of the right kidney there is an 8 mm stone (axial series image 29) that may represent posterior migration of a similarly sized stone previously seen within the urinary pelvis. A second, 6 mm stone previously seen within the right urinary pelvis now may be newly seen within the more anterior calyx of the lower pole of the right kidney (axial series 2, image 53). Smaller right-sided renal stones are unchanged. A fluid density 2.4 cm anterior left midpole renal cyst is unchanged. No follow-up imaging is recommended. No stone  is seen within the urinary bladder which is decompressed, limiting further evaluation. Stomach/Bowel: Oral contrast is seen as distal as the rectum. Moderate sigmoid  diverticula are again seen. There is persistent moderate wall thickening of the mid and distal sigmoid colon. Just superior to the sigmoid colon there is a 3.4 x 2.7 x 2.5 cm (transverse by AP by craniocaudal, axial series 2, image 73) centrally low density and peripheral enhancing likely abscess, previously measuring approximately 3.4 x 4.4 x 3.5 cm when measured in a similar manner on 04/27/2022. This now appears to connect to a further enlarged rim enhancing abscess within the left hemipelvis just medial to the iliac vessels (axial series 2, image 79), which now measures up to approximately 11 x 4 x 7 cm (AP by transverse by craniocaudal), previously 10 x 2 x 6 cm. At the posterior midline aspect of the pelvis, just anterior to the rectum, there is now interval enlargement of a rim enhancing abscess measuring up to 3.1 cm (axial series 2, image 81). This appears connected to the left hemipelvis abscess. Postsurgical changes of proximal ascending colon resection and ileocolic anastomosis are again noted, related to remote perforated appendicitis. No dilated loops of bowel to indicate bowel obstruction. Vascular/Lymphatic: No abdominal aortic aneurysm. The major intra-aortic branch vessels are patent. Mild-to-moderate atherosclerotic calcifications. A splenule is again seen anterior to the left kidney. No mesenteric, retroperitoneal, or pelvic pathologically enlarged lymph nodes by CT criteria. There are subcentimeter short axis left para-aortic lymph nodes just inferior to the left renal artery measuring up to 8 mm (axial series 2, image 39), minimally increased from 7 mm previously. These are likely reactive. Reproductive: The uterus is present. The left ovary again appears to be just superior and lateral to the abscess superior to the sigmoid colon. The left hemipelvis connected abscess does more diffusely contacts the inferior aspect of the left ovary (coronal series 5, image 68). Other: Postsurgical changes  are again seen within the midline ventral abdominal wall. No pneumoperitoneum. No significant free fluid. Musculoskeletal: Moderate L4-5 and L5-S1 facet joint arthropathy. Minimal grade 1 anterolisthesis of L4 on L5, unchanged. IMPRESSION: Compared to 04/27/2022: 1. Interval placement of bilateral nephroureteral stents, in appropriate position. Slight improvement in now mild-to-moderate left hydronephrosis. Left ureteropelvic junction 7 mm stone is seen previously a 9 mm stone was seen. 2. Right lower pole 8 mm and 6 mm caliceal stones likely have mildly moved from the more renal pelvis location previously. 3. Sigmoid diverticulosis with inflammatory wall thickening again indicating diverticulitis. Stable to minimally decreased size of an abscess just superior to the sigmoid colon, however this connects to a now larger left hemipelvis where enhancing abscess and a newly developing small posterior midline pelvis rim enhancing abscess, as described above. The left hemipelvis abscess now more diffusely contacts the inferior aspect of the left ovary. 4. Small left and trace right pleural effusions, new from prior. 5. Two 4-mm right middle lobe pulmonary nodules are unchanged from oldest available CT 08/09/2021. No follow-up needed if patient is low-risk (and has no known or suspected primary neoplasm). Non-contrast chest CT can be considered in 12 months if patient is high-risk. This recommendation follows the consensus statement: Guidelines for Management of Incidental Pulmonary Nodules Detected on CT Images: From the Fleischner Society 2017; Radiology 2017; 284:228-243. Electronically Signed   By: Yvonne Kendall M.D.   On: 05/03/2022 12:22    Anti-infectives: Anti-infectives (From admission, onward)    Start     Dose/Rate Route Frequency  Ordered Stop   04/27/22 1800  piperacillin-tazobactam (ZOSYN) IVPB 3.375 g        3.375 g 12.5 mL/hr over 240 Minutes Intravenous Every 8 hours 04/27/22 1452     04/27/22 1200   piperacillin-tazobactam (ZOSYN) IVPB 3.375 g        3.375 g 100 mL/hr over 30 Minutes Intravenous  Once 04/27/22 1157 04/27/22 1309        Assessment/Plan Sigmoid diverticulitis with multiple abscesses - 1st bout, never had a colonoscopy - CT 11/8 shows acute sigmoid diverticulitis with 2 loculated fluid collections each measuring less than 4 cm in size immediately adjacent to the sigmoid colon suggesting possible pericolic abscesses; there is a linear 8.6 x 2.1 cm loculated fluid collection in left side of pelvis medial to the iliac vessels with interval increase suggesting loculated serous fluid collection or another developing abscess; no pneumoperitoneum - CT 11/14 with stable to minimally decreased abscess superior to sigmoid colon, with large left sided abscess that appears to be communicating, new small posterior midline pelvic abscess - IR drain placed TG 11/14 - tolerating FLD - advance to low fiber diet today - WBC 10 from 14 and pt afebrile - continue zosyn, cxs pending - pt may be ready for discharge in the next 24-48 hrs pending ability to care for drains and clinical progress   ID - zosyn 11/8 FEN - low fiber VTE - ok for chemical dvt ppx from surgical standpoint Foley - none   R renal calculi, L UPJ stone with hydronephrosis, UTI - s/p bilateral ureteral stent placement 11/8, urology following HTN Obesity BMI 40.50   I reviewed hospitalist notes, consultant IR notes, last 24 hr imaging, last 24 h vitals and pain scores, last 48 h intake and output, last 24 h labs and trends.    LOS: 7 days    Norm Parcel, [facility] Surgery 05/04/2022, 10:20 AM Please see Amion for pager number during day hours 7:00am-4:30pm

## 2022-05-05 LAB — CBC
HCT: 30.4 % — ABNORMAL LOW (ref 36.0–46.0)
Hemoglobin: 9 g/dL — ABNORMAL LOW (ref 12.0–15.0)
MCH: 23 pg — ABNORMAL LOW (ref 26.0–34.0)
MCHC: 29.6 g/dL — ABNORMAL LOW (ref 30.0–36.0)
MCV: 77.6 fL — ABNORMAL LOW (ref 80.0–100.0)
Platelets: 474 10*3/uL — ABNORMAL HIGH (ref 150–400)
RBC: 3.92 MIL/uL (ref 3.87–5.11)
RDW: 16.6 % — ABNORMAL HIGH (ref 11.5–15.5)
WBC: 10.1 10*3/uL (ref 4.0–10.5)
nRBC: 0 % (ref 0.0–0.2)

## 2022-05-05 LAB — COMPREHENSIVE METABOLIC PANEL
ALT: 14 U/L (ref 0–44)
AST: 14 U/L — ABNORMAL LOW (ref 15–41)
Albumin: 2.2 g/dL — ABNORMAL LOW (ref 3.5–5.0)
Alkaline Phosphatase: 75 U/L (ref 38–126)
Anion gap: 5 (ref 5–15)
BUN: 18 mg/dL (ref 8–23)
CO2: 26 mmol/L (ref 22–32)
Calcium: 9.1 mg/dL (ref 8.9–10.3)
Chloride: 108 mmol/L (ref 98–111)
Creatinine, Ser: 0.81 mg/dL (ref 0.44–1.00)
GFR, Estimated: >60 mL/min (ref >60)
Glucose, Bld: 143 mg/dL — ABNORMAL HIGH (ref 70–99)
Potassium: 3.4 mmol/L — ABNORMAL LOW (ref 3.5–5.1)
Sodium: 139 mmol/L (ref 135–145)
Total Bilirubin: 0.5 mg/dL (ref 0.3–1.2)
Total Protein: 5.7 g/dL — ABNORMAL LOW (ref 6.5–8.1)

## 2022-05-05 MED ORDER — AMOXICILLIN-POT CLAVULANATE 875-125 MG PO TABS: 1.0000 | ORAL_TABLET | Freq: Two times a day (BID) | ORAL | 0 refills | Status: AC

## 2022-05-05 MED ORDER — HYDRALAZINE HCL 20 MG/ML IJ SOLN
10.0000 mg | INTRAMUSCULAR | Status: DC | PRN
  Administered 2022-05-05: 10 mg via INTRAVENOUS
  Filled 2022-05-05: qty 1

## 2022-05-05 MED ORDER — LISINOPRIL 20 MG PO TABS
40.0000 mg | ORAL_TABLET | Freq: Every day | ORAL | Status: DC
  Administered 2022-05-05: 40 mg via ORAL
  Filled 2022-05-05: qty 2

## 2022-05-05 MED ORDER — HYDROCODONE-ACETAMINOPHEN 5-325 MG PO TABS: 1.0000 | ORAL_TABLET | Freq: Four times a day (QID) | ORAL | 0 refills | Status: DC | PRN

## 2022-05-05 MED ORDER — POTASSIUM CHLORIDE CRYS ER 20 MEQ PO TBCR
60.0000 meq | EXTENDED_RELEASE_TABLET | Freq: Once | ORAL | Status: AC
  Administered 2022-05-05: 60 meq via ORAL
  Filled 2022-05-05: qty 3

## 2022-05-05 NOTE — Progress Notes (Addendum)
[facility] Progress Note  8 Days Post-Op  Subjective: Some soreness at TG drain site but overall feeling well. Tolerating soft diet and feels hungry this AM. Having bowel function. Husband will need to be taught drain care.   Objective: Vital signs in last 24 hours: Temp:  [98.1 F (36.7 C)-98.6 F (37 C)] 98.1 F (36.7 C) (11/16 0744) Pulse Rate:  [48-61] 60 (11/16 0744) Resp:  [18] 18 (11/16 0744) BP: (132-146)/(60-73) 135/70 (11/16 0744) SpO2:  [92 %-99 %] 99 % (11/16 0744) Last BM Date : 05/04/22  Intake/Output from previous day: 11/15 0701 - 11/16 0700 In: 120 [P.O.:60; IV Piggyback:50] Out: 355 [Urine:300; Drains:55] Intake/Output this shift: No intake/output data recorded.  PE: Gen:  Alert, NAD, pleasant Abd: soft, ND, Minimally tender in the LLQ, TG drain with seropurulent appearing fluid  Lab Results:  Recent Labs    05/04/22 0434 05/05/22 0442  WBC 10.3 10.1  HGB 9.0* 9.0*  HCT 30.2* 30.4*  PLT 506* 474*    BMET Recent Labs    05/04/22 0434 05/05/22 0442  NA 139 139  K 3.3* 3.4*  CL 104 108  CO2 28 26  GLUCOSE 114* 143*  BUN 14 18  CREATININE 0.81 0.81  CALCIUM 9.2 9.1    PT/INR No results for input(s): "LABPROT", "INR" in the last 72 hours. CMP     Component Value Date/Time   NA 139 05/05/2022 0442   K 3.4 (L) 05/05/2022 0442   CL 108 05/05/2022 0442   CO2 26 05/05/2022 0442   GLUCOSE 143 (H) 05/05/2022 0442   BUN 18 05/05/2022 0442   CREATININE 0.81 05/05/2022 0442   CALCIUM 9.1 05/05/2022 0442   PROT 5.7 (L) 05/05/2022 0442   ALBUMIN 2.2 (L) 05/05/2022 0442   AST 14 (L) 05/05/2022 0442   ALT 14 05/05/2022 0442   ALKPHOS 75 05/05/2022 0442   BILITOT 0.5 05/05/2022 0442   GFRNONAA >60 05/05/2022 0442   Lipase     Component Value Date/Time   LIPASE 26 04/27/2022 0753       Studies/Results: CT GUIDED VISCERAL FLUID DRAIN BY PERC CATH  Result Date: 05/03/2022 INDICATION: 71 year old female with pelvic  abscess presents for drainage EXAM: CT GUIDED DRAINAGE OF PELVIC ABSCESS MEDICATIONS: The patient is currently admitted to the hospital and receiving intravenous antibiotics. The antibiotics were administered within an appropriate time frame prior to the initiation of the procedure. ANESTHESIA/SEDATION: 2.0 mg IV Versed 100 mcg IV Fentanyl Moderate Sedation Time:  18 minutes The patient was continuously monitored during the procedure by the interventional radiology nurse under my direct supervision. COMPLICATIONS: None TECHNIQUE: Informed written consent was obtained from the patient after a thorough discussion of the procedural risks, benefits and alternatives. All questions were addressed. Maximal Sterile Barrier Technique was utilized including caps, mask, sterile gowns, sterile gloves, sterile drape, hand hygiene and skin antiseptic. A timeout was performed prior to the initiation of the procedure. PROCEDURE: Patient was positioned prone on the CT gantry table. Scout CT acquired for planning purposes. The left gluteal region was prepped with Chlorhexidine in a sterile fashion, and a sterile drape was applied covering the operative field. A sterile gown and sterile gloves were used for the procedure. Local anesthesia was provided with 1% Lidocaine. Once the patient was anesthetized, small stab incision was made with 11 blade scalpel. 18 gauge 20 cm trocar needle was then advanced with interval CT imaging guidance in a left transgluteal, caudal-cranial approach into the abscess in the  left pelvis. Once we confirmed needle tip position a wire was advanced into the collection. Modified Seldinger technique was then used to place a 10 [place] drain into the collection. Approximately 100 cc of purulent material aspirated. Drain was attached to bulb suction and sutured in position. Patient tolerated the procedure well and remained hemodynamically stable throughout. No complications were encountered and no significant  blood loss. FINDINGS: Scout CT demonstrates similar size of left-sided pelvic abscess. A left transgluteal approach caudal-cranial direction seems to be the only feasible drain approach. Final image demonstrates the 10 French drain formed within the collection. Proximally 100 cc of frankly purulent material aspirated. IMPRESSION: Status post CT-guided drainage of left pelvic abscess. Signed, Dulcy Fanny. Nadene Rubins, RPVI [facility] Radiology Electronically Signed   By: Corrie Mckusick D.O.   On: 05/03/2022 17:06   CT ABDOMEN PELVIS W CONTRAST  Result Date: 05/03/2022 CLINICAL DATA:  Continued abdominal pain. History of diverticulitis with abscess. Tried to drain last week but unable to place a drain. Complication of diverticulitis suspected. EXAM: CT ABDOMEN AND PELVIS WITH CONTRAST TECHNIQUE: Multidetector CT imaging of the abdomen and pelvis was performed using the standard protocol following bolus administration of intravenous contrast. RADIATION DOSE REDUCTION: This exam was performed according to the departmental dose-optimization program which includes automated exposure control, adjustment of the mA and/or kV according to patient size and/or use of iterative reconstruction technique. CONTRAST:  140mL OMNIPAQUE IOHEXOL 300 MG/ML  SOLN COMPARISON:  CT abdomen and pelvis with contrast 08/25/2021, CT abdomen pelvis without contrast 04/27/2022, CT abdomen pelvis with contrast 04/27/2022; attempted CT-guided pelvic abscess drain placement 04/28/2022 (procedure was aborted due to intervening viscus in prone position precluding access); CT abdomen and pelvis 08/09/2021 FINDINGS: Lower chest: Lateral peripheral basilar linear and ground-glass subsegmental atelectasis. Small left and trace right pleural effusions, new from 04/27/2022. There is again posterior left lower lobe curvilinear subsegmental atelectasis versus scarring. Heart size is moderately enlarged.  There are dense coronary artery calcifications. Anterior middle lobe 4 mm subpleural nodule (axial series 4, image 17) and more inferior 4 mm anterior middle lobe subpleural nodule (axial image 21), unchanged from 08/09/2021 CT, now demonstrating 8 month stability. Hepatobiliary: There is diffuse decreased density again seen throughout the liver suggesting fatty infiltration. Anterior left hepatic lobe 19 mm fluid density cyst is unchanged from 08/09/2021. No intrahepatic or extrahepatic biliary ductal dilatation. No gallbladder wall thickening or calcified gallstones. Pancreas: Unremarkable. No pancreatic ductal dilatation or surrounding inflammatory changes. Spleen: Unchanged oval well-circumscribed low-density 9 mm lesion within the lateral aspect of the spleen (axial series 2, image 22) compared to 08/09/2021. Adrenals/Urinary Tract: Normal adrenals. The kidneys enhance uniformly and are symmetric in size. New bilateral nephroureteral stents with the proximal pigtails within the lateral renal pelves and the distal pigtails within the urinary bladder. Mild interval decrease in persistent mild-to-moderate left hydronephrosis. The previously seen left ureteropelvic junction 9 mm stone is now seen adjacent to the nephroureteral stent and measures up to 7 mm (axial series 2, image 46 and coronal series 5, image 58). Within the posterior calyx of the lower pole of the right kidney there is an 8 mm stone (axial series image 29) that may represent posterior migration of a similarly sized stone previously seen within the urinary pelvis. A second, 6 mm stone previously seen within the right urinary pelvis now may be newly seen within the more anterior calyx of the lower pole of the right kidney (axial series 2,  image 53). Smaller right-sided renal stones are unchanged. A fluid density 2.4 cm anterior left midpole renal cyst is unchanged. No follow-up imaging is recommended. No stone is seen within the urinary bladder  which is decompressed, limiting further evaluation. Stomach/Bowel: Oral contrast is seen as distal as the rectum. Moderate sigmoid diverticula are again seen. There is persistent moderate wall thickening of the mid and distal sigmoid colon. Just superior to the sigmoid colon there is a 3.4 x 2.7 x 2.5 cm (transverse by AP by craniocaudal, axial series 2, image 73) centrally low density and peripheral enhancing likely abscess, previously measuring approximately 3.4 x 4.4 x 3.5 cm when measured in a similar manner on 04/27/2022. This now appears to connect to a further enlarged rim enhancing abscess within the left hemipelvis just medial to the iliac vessels (axial series 2, image 79), which now measures up to approximately 11 x 4 x 7 cm (AP by transverse by craniocaudal), previously 10 x 2 x 6 cm. At the posterior midline aspect of the pelvis, just anterior to the rectum, there is now interval enlargement of a rim enhancing abscess measuring up to 3.1 cm (axial series 2, image 81). This appears connected to the left hemipelvis abscess. Postsurgical changes of proximal ascending colon resection and ileocolic anastomosis are again noted, related to remote perforated appendicitis. No dilated loops of bowel to indicate bowel obstruction. Vascular/Lymphatic: No abdominal aortic aneurysm. The major intra-aortic branch vessels are patent. Mild-to-moderate atherosclerotic calcifications. A splenule is again seen anterior to the left kidney. No mesenteric, retroperitoneal, or pelvic pathologically enlarged lymph nodes by CT criteria. There are subcentimeter short axis left para-aortic lymph nodes just inferior to the left renal artery measuring up to 8 mm (axial series 2, image 39), minimally increased from 7 mm previously. These are likely reactive. Reproductive: The uterus is present. The left ovary again appears to be just superior and lateral to the abscess superior to the sigmoid colon. The left hemipelvis connected  abscess does more diffusely contacts the inferior aspect of the left ovary (coronal series 5, image 68). Other: Postsurgical changes are again seen within the midline ventral abdominal wall. No pneumoperitoneum. No significant free fluid. Musculoskeletal: Moderate L4-5 and L5-S1 facet joint arthropathy. Minimal grade 1 anterolisthesis of L4 on L5, unchanged. IMPRESSION: Compared to 04/27/2022: 1. Interval placement of bilateral nephroureteral stents, in appropriate position. Slight improvement in now mild-to-moderate left hydronephrosis. Left ureteropelvic junction 7 mm stone is seen previously a 9 mm stone was seen. 2. Right lower pole 8 mm and 6 mm caliceal stones likely have mildly moved from the more renal pelvis location previously. 3. Sigmoid diverticulosis with inflammatory wall thickening again indicating diverticulitis. Stable to minimally decreased size of an abscess just superior to the sigmoid colon, however this connects to a now larger left hemipelvis where enhancing abscess and a newly developing small posterior midline pelvis rim enhancing abscess, as described above. The left hemipelvis abscess now more diffusely contacts the inferior aspect of the left ovary. 4. Small left and trace right pleural effusions, new from prior. 5. Two 4-mm right middle lobe pulmonary nodules are unchanged from oldest available CT 08/09/2021. No follow-up needed if patient is low-risk (and has no known or suspected primary neoplasm). Non-contrast chest CT can be considered in 12 months if patient is high-risk. This recommendation follows the consensus statement: Guidelines for Management of Incidental Pulmonary Nodules Detected on CT Images: From the Fleischner Society 2017; Radiology 2017; 284:228-243. Electronically Signed   By: Windy Fast  Viola M.D.   On: 05/03/2022 12:22    Anti-infectives: Anti-infectives (From admission, onward)    Start     Dose/Rate Route Frequency Ordered Stop   04/27/22 1800   piperacillin-tazobactam (ZOSYN) IVPB 3.375 g        3.375 g 12.5 mL/hr over 240 Minutes Intravenous Every 8 hours 04/27/22 1452     04/27/22 1200  piperacillin-tazobactam (ZOSYN) IVPB 3.375 g        3.375 g 100 mL/hr over 30 Minutes Intravenous  Once 04/27/22 1157 04/27/22 1309        Assessment/Plan Sigmoid diverticulitis with multiple abscesses - 1st bout, never had a colonoscopy - CT 11/8 shows acute sigmoid diverticulitis with 2 loculated fluid collections each measuring less than 4 cm in size immediately adjacent to the sigmoid colon suggesting possible pericolic abscesses; there is a linear 8.6 x 2.1 cm loculated fluid collection in left side of pelvis medial to the iliac vessels with interval increase suggesting loculated serous fluid collection or another developing abscess; no pneumoperitoneum - CT 11/14 with stable to minimally decreased abscess superior to sigmoid colon, with large left sided abscess that appears to be communicating, new small posterior midline pelvic abscess - IR drain placed TG 11/14 - tolerating low fiber diet  - WBC stable at 10 and pt afebrile - continue zosyn, cxs pending - NGTD this AM - stable for discharge from a surgical standpoint when comfortable with drain care. Surgical follow up in AV[facility] - patient will need referral to GI for colonoscopy in 8-10 weeks once she is over acute diverticulitis  - recommend total course of abx of 14 days, PO augmentin would be appropriate to complete course  - general surgery will sign off but we are available for questions/concerns    ID - zosyn 11/8 FEN - low fiber VTE - ok for chemical dvt ppx from surgical standpoint Foley - none   R renal calculi, L UPJ stone with hydronephrosis, UTI - s/p bilateral ureteral stent placement 11/8, urology following HTN Obesity BMI 40.50   I reviewed hospitalist notes, consultant IR notes, last 24 h vitals and pain scores, last 48 h intake and output, last 24 h labs and  trends.    LOS: 8 days    Norm Parcel, [facility] Surgery 05/05/2022, 9:11 AM Please see Amion for pager number during day hours 7:00am-4:30pm   I personally saw the patient and performed a substantive portion of this encounter, including a complete performance of at least one of the key components (MDM, Hx and/or Exam), in conjunction with the Advanced Practice Provider Barkley Boards, PA-C.   Coralie Keens, MD

## 2022-05-05 NOTE — Plan of Care (Signed)

## 2022-05-05 NOTE — Discharge Summary (Signed)
Physician Discharge Summary   Patient: Heidi Peterson MRN: [id number] DOB: [birth year]  Admit date:     04/27/2022  Discharge date: 05/05/22  Discharge Physician: Marylu Lund   PCP: [facility]   Recommendations at discharge:    Follow up with PCP in 1-2 weeks Follow up with General Surgery as scheduled  Discharge Diagnoses: Principal Problem:   Diverticulitis of intestine with abscess Active Problems:   Complicated UTI (urinary tract infection)   Obstructive uropathy   Hydronephrosis of left kidney   Microcytic anemia   Hyperglycemia   HTN (hypertension)   Obesity, Class III, BMI 40-49.9 (morbid obesity) ([place])  Resolved Problems:   * No resolved hospital problems. *  Hospital Course: Heidi Peterson is a 71 y.o. female with a history of hypertension who presented secondary to abdominal and flank pain and found to have a left obstructive UPJ stone and diverticulitis with abscess. Urology and General surgery consulted. Patient underwent bilateral ureteral stent placement on 11/8. Attempt to place drain into diverticular abscess unsuccessful. Repeat CT abdomen/pelvis shows evidence of new abscess.  Assessment and Plan: * Diverticulitis of intestine with abscess Had been continued on empiric zosyn. General surgery following -Pt now s/p drain placed by IR on 11/14  -WBC has normalized -Per General Surgery, ok to d/c today with drain, to complete total 14 days abx with augmentin on d/c   Complicated UTI (urinary tract infection) Associated obstructive uropathy with likely infected stone. Urine culture significant for pan-sensitive E. Coli. Patient was empirically on Zosyn IV. -completed course   Obstructive uropathy Secondary to left UPJ stone with associated bilateral non obstructing ureteral stones requiring bilateral ureteral stent placement by urology on 11/8. Creatinine stable.   Obesity, Class III, BMI 40-49.9 (morbid obesity)  ([place]) Estimated body mass index is 40.5 kg/m as calculated from the following:   Height as of this encounter: 5' 2.5" (1.588 m).   Weight as of this encounter: 102.1 kg.   HTN (hypertension) -Continue metoprolol succinate   Hyperglycemia Hemoglobin A1C of 6.5%. Discussed with patient and she prefers lifestyle changes. Will allow follow-up with primary care physician for repeat hemoglobin A1C to confirm diagnosis of diabetes at that time.   Microcytic anemia Chronic. Stable. Iron panel with possible iron deficiency, although ferritin is normal.   Hypokalemia -replaced       Consultants: General Surgery, IR Procedures performed: IR drain placement 11/14  Disposition: Home Diet recommendation:  Regular diet DISCHARGE MEDICATION: Allergies as of 05/05/2022       Reactions   Toradol [ketorolac Tromethamine] Other (See Comments)   hallucinations        Medication List     STOP taking these medications    ASPERCREME LIDOCAINE EX   oxyCODONE 5 MG immediate release tablet Commonly known as: Oxy IR/ROXICODONE       TAKE these medications    acetaminophen 500 MG tablet Commonly known as: TYLENOL Take 1,000 mg by mouth every 8 (eight) hours as needed (pain).   acetaminophen-codeine 300-30 MG tablet Commonly known as: TYLENOL #3 Take 1 tablet by mouth every 4 (four) hours as needed.   amoxicillin-clavulanate 875-125 MG tablet Commonly known as: AUGMENTIN Take 1 tablet by mouth 2 (two) times daily for 5 days.   BIOTIN PO Take 1 tablet by mouth every morning.   HYDROcodone-acetaminophen 5-325 MG tablet Commonly known as: NORCO/VICODIN Take 1 tablet by mouth every 6 (six) hours as needed for severe pain.   lactobacillus acidophilus Tabs tablet  Take 1 tablet by mouth daily. Probiotic with apple cider vinegar   lisinopril 40 MG tablet Commonly known as: ZESTRIL Take 40 mg by mouth every morning.   methocarbamol 500 MG tablet Commonly known as: ROBAXIN Take  1 tablet (500 mg total) by mouth every 6 (six) hours as needed for muscle spasms.   metoprolol succinate 50 MG 24 hr tablet Commonly known as: TOPROL-XL Take 50 mg by mouth every morning. Take with or immediately following a meal.   multivitamin with minerals Tabs tablet Take 1 tablet by mouth every morning.   naproxen sodium 220 MG tablet Commonly known as: ALEVE Take 220 mg by mouth every 8 (eight) hours as needed (pain).   OMEGA-3 PO Take 1 capsule by mouth every morning.   OSTEO BI-FLEX REGULAR STRENGTH PO Take 2 tablets by mouth daily.   PRESCRIPTION MEDICATION Inhale into the lungs See admin instructions. CPAP - use at night and whenever resting   Vitamin D-3 125 MCG (5000 UT) Tabs Take 2 tablets by mouth daily.        Follow-up Information     Coralie Keens, MD. Go on 05/30/2022.   Specialty: General Surgery Why: 9:10 AM. Please arrive 30 min prior to appointment time to check in. Have ID and insurance information with you. Contact information: [address] [place] [address] [phone]         [facility], [facility] Follow up in 1 week(s).   Why: Hospital follow up Contact information: [address] [place] [address] [phone]                Discharge Exam: Heidi Peterson Weights   04/27/22 [id number] 04/27/22 1628  Weight: 102.1 kg 102.1 kg   General exam: Awake, laying in bed, in nad Respiratory system: Normal respiratory effort, no wheezing Cardiovascular system: regular rate, s1, s2 Gastrointestinal system: Soft, nondistended, positive BS Central nervous system: CN2-12 grossly intact, strength intact Extremities: Perfused, no clubbing Skin: Normal skin turgor, no notable skin lesions seen Psychiatry: Mood normal // no visual hallucinations   Condition at discharge: fair  The results of significant diagnostics from this hospitalization (including imaging, microbiology, ancillary and laboratory) are listed below  for reference.   Imaging Studies: CT GUIDED VISCERAL FLUID DRAIN BY PERC CATH  Result Date: 05/03/2022 INDICATION: 71 year old female with pelvic abscess presents for drainage EXAM: CT GUIDED DRAINAGE OF PELVIC ABSCESS MEDICATIONS: The patient is currently admitted to the hospital and receiving intravenous antibiotics. The antibiotics were administered within an appropriate time frame prior to the initiation of the procedure. ANESTHESIA/SEDATION: 2.0 mg IV Versed 100 mcg IV Fentanyl Moderate Sedation Time:  18 minutes The patient was continuously monitored during the procedure by the interventional radiology nurse under my direct supervision. COMPLICATIONS: None TECHNIQUE: Informed written consent was obtained from the patient after a thorough discussion of the procedural risks, benefits and alternatives. All questions were addressed. Maximal Sterile Barrier Technique was utilized including caps, mask, sterile gowns, sterile gloves, sterile drape, hand hygiene and skin antiseptic. A timeout was performed prior to the initiation of the procedure. PROCEDURE: Patient was positioned prone on the CT gantry table. Scout CT acquired for planning purposes. The left gluteal region was prepped with Chlorhexidine in a sterile fashion, and a sterile drape was applied covering the operative field. A sterile gown and sterile gloves were used for the procedure. Local anesthesia was provided with 1% Lidocaine. Once the patient was anesthetized, small stab incision was made with 11  blade scalpel. 18 gauge 20 cm trocar needle was then advanced with interval CT imaging guidance in a left transgluteal, caudal-cranial approach into the abscess in the left pelvis. Once we confirmed needle tip position a wire was advanced into the collection. Modified Seldinger technique was then used to place a 10 [place] drain into the collection. Approximately 100 cc of purulent material aspirated. Drain was attached to bulb suction and sutured in  position. Patient tolerated the procedure well and remained hemodynamically stable throughout. No complications were encountered and no significant blood loss. FINDINGS: Scout CT demonstrates similar size of left-sided pelvic abscess. A left transgluteal approach caudal-cranial direction seems to be the only feasible drain approach. Final image demonstrates the 10 French drain formed within the collection. Proximally 100 cc of frankly purulent material aspirated. IMPRESSION: Status post CT-guided drainage of left pelvic abscess. Signed, Dulcy Fanny. Nadene Rubins, RPVI [facility] Electronically Signed   By: Corrie Mckusick D.O.   On: 05/03/2022 17:06   CT ABDOMEN PELVIS W CONTRAST  Result Date: 05/03/2022 CLINICAL DATA:  Continued abdominal pain. History of diverticulitis with abscess. Tried to drain last week but unable to place a drain. Complication of diverticulitis suspected. EXAM: CT ABDOMEN AND PELVIS WITH CONTRAST TECHNIQUE: Multidetector CT imaging of the abdomen and pelvis was performed using the standard protocol following bolus administration of intravenous contrast. RADIATION DOSE REDUCTION: This exam was performed according to the departmental dose-optimization program which includes automated exposure control, adjustment of the mA and/or kV according to patient size and/or use of iterative reconstruction technique. CONTRAST:  169mL OMNIPAQUE IOHEXOL 300 MG/ML  SOLN COMPARISON:  CT abdomen and pelvis with contrast 08/25/2021, CT abdomen pelvis without contrast 04/27/2022, CT abdomen pelvis with contrast 04/27/2022; attempted CT-guided pelvic abscess drain placement 04/28/2022 (procedure was aborted due to intervening viscus in prone position precluding access); CT abdomen and pelvis 08/09/2021 FINDINGS: Lower chest: Lateral peripheral basilar linear and ground-glass subsegmental atelectasis. Small left and trace right pleural effusions, new  from 04/27/2022. There is again posterior left lower lobe curvilinear subsegmental atelectasis versus scarring. Heart size is moderately enlarged. There are dense coronary artery calcifications. Anterior middle lobe 4 mm subpleural nodule (axial series 4, image 17) and more inferior 4 mm anterior middle lobe subpleural nodule (axial image 21), unchanged from 08/09/2021 CT, now demonstrating 8 month stability. Hepatobiliary: There is diffuse decreased density again seen throughout the liver suggesting fatty infiltration. Anterior left hepatic lobe 19 mm fluid density cyst is unchanged from 08/09/2021. No intrahepatic or extrahepatic biliary ductal dilatation. No gallbladder wall thickening or calcified gallstones. Pancreas: Unremarkable. No pancreatic ductal dilatation or surrounding inflammatory changes. Spleen: Unchanged oval well-circumscribed low-density 9 mm lesion within the lateral aspect of the spleen (axial series 2, image 22) compared to 08/09/2021. Adrenals/Urinary Tract: Normal adrenals. The kidneys enhance uniformly and are symmetric in size. New bilateral nephroureteral stents with the proximal pigtails within the lateral renal pelves and the distal pigtails within the urinary bladder. Mild interval decrease in persistent mild-to-moderate left hydronephrosis. The previously seen left ureteropelvic junction 9 mm stone is now seen adjacent to the nephroureteral stent and measures up to 7 mm (axial series 2, image 46 and coronal series 5, image 58). Within the posterior calyx of the lower pole of the right kidney there is an 8 mm stone (axial series image 29) that may represent posterior migration of a similarly sized stone previously seen within the urinary pelvis. A second, 6 mm stone previously  seen within the right urinary pelvis now may be newly seen within the more anterior calyx of the lower pole of the right kidney (axial series 2, image 53). Smaller right-sided renal stones are unchanged. A fluid  density 2.4 cm anterior left midpole renal cyst is unchanged. No follow-up imaging is recommended. No stone is seen within the urinary bladder which is decompressed, limiting further evaluation. Stomach/Bowel: Oral contrast is seen as distal as the rectum. Moderate sigmoid diverticula are again seen. There is persistent moderate wall thickening of the mid and distal sigmoid colon. Just superior to the sigmoid colon there is a 3.4 x 2.7 x 2.5 cm (transverse by AP by craniocaudal, axial series 2, image 73) centrally low density and peripheral enhancing likely abscess, previously measuring approximately 3.4 x 4.4 x 3.5 cm when measured in a similar manner on 04/27/2022. This now appears to connect to a further enlarged rim enhancing abscess within the left hemipelvis just medial to the iliac vessels (axial series 2, image 79), which now measures up to approximately 11 x 4 x 7 cm (AP by transverse by craniocaudal), previously 10 x 2 x 6 cm. At the posterior midline aspect of the pelvis, just anterior to the rectum, there is now interval enlargement of a rim enhancing abscess measuring up to 3.1 cm (axial series 2, image 81). This appears connected to the left hemipelvis abscess. Postsurgical changes of proximal ascending colon resection and ileocolic anastomosis are again noted, related to remote perforated appendicitis. No dilated loops of bowel to indicate bowel obstruction. Vascular/Lymphatic: No abdominal aortic aneurysm. The major intra-aortic branch vessels are patent. Mild-to-moderate atherosclerotic calcifications. A splenule is again seen anterior to the left kidney. No mesenteric, retroperitoneal, or pelvic pathologically enlarged lymph nodes by CT criteria. There are subcentimeter short axis left para-aortic lymph nodes just inferior to the left renal artery measuring up to 8 mm (axial series 2, image 39), minimally increased from 7 mm previously. These are likely reactive. Reproductive: The uterus is  present. The left ovary again appears to be just superior and lateral to the abscess superior to the sigmoid colon. The left hemipelvis connected abscess does more diffusely contacts the inferior aspect of the left ovary (coronal series 5, image 68). Other: Postsurgical changes are again seen within the midline ventral abdominal wall. No pneumoperitoneum. No significant free fluid. Musculoskeletal: Moderate L4-5 and L5-S1 facet joint arthropathy. Minimal grade 1 anterolisthesis of L4 on L5, unchanged. IMPRESSION: Compared to 04/27/2022: 1. Interval placement of bilateral nephroureteral stents, in appropriate position. Slight improvement in now mild-to-moderate left hydronephrosis. Left ureteropelvic junction 7 mm stone is seen previously a 9 mm stone was seen. 2. Right lower pole 8 mm and 6 mm caliceal stones likely have mildly moved from the more renal pelvis location previously. 3. Sigmoid diverticulosis with inflammatory wall thickening again indicating diverticulitis. Stable to minimally decreased size of an abscess just superior to the sigmoid colon, however this connects to a now larger left hemipelvis where enhancing abscess and a newly developing small posterior midline pelvis rim enhancing abscess, as described above. The left hemipelvis abscess now more diffusely contacts the inferior aspect of the left ovary. 4. Small left and trace right pleural effusions, new from prior. 5. Two 4-mm right middle lobe pulmonary nodules are unchanged from oldest available CT 08/09/2021. No follow-up needed if patient is low-risk (and has no known or suspected primary neoplasm). Non-contrast chest CT can be considered in 12 months if patient is high-risk. This recommendation follows the  consensus statement: Guidelines for Management of Incidental Pulmonary Nodules Detected on CT Images: From the Fleischner Society 2017; Radiology 2017; 284:228-243. Electronically Signed   By: Yvonne Kendall M.D.   On: 05/03/2022 12:22    CT PELVIS LIMITED WO CONTRAST  Result Date: 04/28/2022 CLINICAL DATA:  Pelvic abscess.  Abscess drain placement EXAM: CT PELVIS WITHOUT CONTRAST TECHNIQUE: Multidetector CT imaging of the pelvis was performed following the standard protocol without intravenous contrast. RADIATION DOSE REDUCTION: This exam was performed according to the departmental dose-optimization program which includes automated exposure control, adjustment of the mA and/or kV according to patient size and/or use of iterative reconstruction technique. COMPARISON:  CT AP, 05/07/2022. FINDINGS: Informed written consent was obtained from the patient and/or patient's representative after a discussion of the risks, benefits and alternatives to treatment. The patient was placed prone on the CT gantry and a pre procedural CT was performed. A timeout was performed prior to the initiation of the procedure. Urinary Tract: Decompressed urinary bladder. Bilateral retrograde ureteral stents with pigtails coiled within the bladder. Bowel: Imaged portions of the small bowel and colon are nonobstructed. Sigmoid diverticulitis, best appreciated on recent comparison contrasted CT AP. Vascular/Lymphatic: No pathologically enlarged lymph nodes. No significant vascular abnormality seen. Reproductive:  Normal appearance of the uterus and adnexa. Other: Deep pelvic abscess, measuring proximally 3.5 x 2.5 cm (AP by transaxial) with intervening viscus in prone position. Musculoskeletal: Obese.  No interval osseous abnormality. IMPRESSION: Aborted CT-guided drain placement of pelvic abscess. Small, deep pelvic abscess with intervening viscus in prone position. No safe window for percutaneous access. Michaelle Birks, MD [facility] Electronically Signed   By: Michaelle Birks M.D.   On: 04/28/2022 16:46   DG C-Arm 1-60 Min-No Report  Result Date: 04/27/2022 Fluoroscopy was utilized by the requesting physician.   No radiographic interpretation.   CT ABDOMEN PELVIS W CONTRAST  Result Date: 04/27/2022 CLINICAL DATA:  Left lower quadrant abdominal pain EXAM: CT ABDOMEN AND PELVIS WITH CONTRAST TECHNIQUE: Multidetector CT imaging of the abdomen and pelvis was performed using the standard protocol following bolus administration of intravenous contrast. RADIATION DOSE REDUCTION: This exam was performed according to the departmental dose-optimization program which includes automated exposure control, adjustment of the mA and/or kV according to patient size and/or use of iterative reconstruction technique. CONTRAST:  143mL OMNIPAQUE IOHEXOL 300 MG/ML  SOLN COMPARISON:  Study done earlier today FINDINGS: Lower chest: There are small linear densities in the lower lung fields, more so on the left side suggesting subsegmental atelectasis. Scattered coronary artery calcifications are seen. Hepatobiliary: There is fatty infiltration in liver. There is 1.7 cm cyst in the left lobe. There is no dilation of bile ducts. Gallbladder is unremarkable. Pancreas: No focal abnormalities are seen. Spleen: Spleen is not enlarged. There is subcentimeter low-density, possibly a cyst or hemangioma. Adrenals/Urinary Tract: Adrenals are unremarkable. There is 7 mm calculus at the left ureteropelvic junction causing moderate left hydronephrosis. There are few bilateral renal stones, more so in the right kidney. There are 2 stones in the right renal pelvis measuring up to 8 mm. There is no hydronephrosis in the right kidney. Urinary bladder is not distended. There are few left renal cysts measuring up to 2.4 cm. Stomach/Bowel: Stomach is unremarkable. Small bowel loops are not dilated. Appendix is not seen. Diverticula seen in colon. There is wall thickening and pericolic stranding in sigmoid colon. There is 4 x 2.2 cm loculated fluid collection in the upper margin of  sigmoid colon. There is another 2 x 1.2 cm fluid collection in the inferior margin of  sigmoid. There is a linear loculated fluid collection in left side of pelvis measuring 8.6 x 2.1 cm with interval increase in size. Small amount of free fluid is seen in adjacent to the rectosigmoid with interval increase. Vascular/Lymphatic: Scattered arterial calcifications are seen. Reproductive: Unremarkable. Other: There is no ascites or pneumoperitoneum. There is a stranding in subcutaneous plane in the suprapubic region, possibly residual from previous surgery. Small umbilical hernia containing fat is seen. Musculoskeletal: No acute findings are seen. IMPRESSION: Diverticulosis of colon. There is focal wall thickening along with pericolic stranding in sigmoid colon suggesting acute diverticulitis. There are 2 loculated fluid collections each measuring less than 4 cm in size immediately adjacent to the sigmoid colon suggesting possible pericolic abscesses. There is a linear 8.6 x 2.1 cm loculated fluid collection in left side of pelvis medial to the iliac vessels with interval increase suggesting loculated serous fluid collection or another developing abscess related to acute diverticulitis. Small amount of free fluid is seen in pelvis. There is 7 mm calculus at the left ureteropelvic junction causing moderate left hydronephrosis. Bilateral renal stones. There is no evidence of intestinal obstruction or pneumoperitoneum. Fatty liver. Linear densities in left lower lung fields suggest subsegmental atelectasis. Other findings as described in the body of the report. Electronically Signed   By: Ernie Avena M.D.   On: 04/27/2022 16:04   CT Renal Stone Study  Result Date: 04/27/2022 CLINICAL DATA:  Left lower quadrant and back pain. EXAM: CT ABDOMEN AND PELVIS WITHOUT CONTRAST TECHNIQUE: Multidetector CT imaging of the abdomen and pelvis was performed following the standard protocol without IV contrast. RADIATION DOSE REDUCTION: This exam was performed according to the departmental dose-optimization  program which includes automated exposure control, adjustment of the mA and/or kV according to patient size and/or use of iterative reconstruction technique. COMPARISON:  08/25/2021 FINDINGS: Lower chest: No focal consolidation or pleural effusion. Hepatobiliary: The liver shows diffusely decreased attenuation suggesting fat deposition. Stable 14 mm subcapsular lesion lateral segment left liver compatible with a cyst. No followup imaging is recommended. There is no evidence for gallstones, gallbladder wall thickening, or pericholecystic fluid. No intrahepatic or extrahepatic biliary dilation. Pancreas: No focal mass lesion. No dilatation of the main duct. No intraparenchymal cyst. No peripancreatic edema. Spleen: No splenomegaly. No focal mass lesion. Adrenals/Urinary Tract: No adrenal nodule or mass. Approximately 5 nonobstructing stones are seen in the right kidney including a 4 x 8 x 7 mm stone in the right renal pelvis. No right ureteral stone. There is moderate left hydronephrosis with multiple punctate nonobstructing stones in the right kidney. The hydronephrosis is secondary to the presence of a 4 x 7 x 9 mm stone at the left UPJ. 2.1 cm water density lesion anterior interpolar left kidney is compatible with a cyst. No followup imaging is recommended. No left ureteral stone. Bladder is decompressed without evidence for stone disease. Stomach/Bowel: Stomach is unremarkable. No gastric wall thickening. No evidence of outlet obstruction. Duodenum is normally positioned as is the ligament of Treitz. No small bowel wall thickening. No small bowel dilatation. Status post right cecectomy. 4.7 x 3.9 x 3.8 cm ill-defined collection of soft tissue is identified immediately cranial to the proximal sigmoid colon. There is adjacent edema/inflammation in multiple scattered diverticuli are seen in the left colon. Left ovary may be just cranial and lateral to this collection. Vascular/Lymphatic: There is mild atherosclerotic  calcification of  the abdominal aorta without aneurysm. There is no gastrohepatic or hepatoduodenal ligament lymphadenopathy. No retroperitoneal or mesenteric lymphadenopathy. No pelvic sidewall lymphadenopathy. Reproductive: The uterus is unremarkable. No right adnexal mass. Left ovary is not well demonstrated discrete from the above described 4.7 cm soft tissue lesion adjacent to the sigmoid colon in the left pelvis. Other: There is some edema in the soft tissues of the pelvic floor. No substantial intraperitoneal free fluid Musculoskeletal: No worrisome lytic or sclerotic osseous abnormality. IMPRESSION: 1. 4.7 x 3.9 x 3.8 cm ill-defined collection of soft tissue immediately cranial to the proximal sigmoid colon. There is adjacent edema/inflammation and multiple scattered diverticuli in the left colon. Assessment is limited by lack of intravenous contrast material. Imaging features raise concern for soft tissue lesion in the left pelvis. Findings may reflect sequelae of diverticulitis and pericolonic abscess. Left ovary is not definitely seen distinct from this abnormality in left ovarian lesion is a possibility. Follow-up CT with oral and intravenous contrast recommended to further evaluate. 2. Moderate left hydronephrosis secondary to the presence of a 4 x 7 x 9 mm stone at the left UPJ. 3. Bilateral nonobstructing nephrolithiasis. 4. Hepatic steatosis. 5.  Aortic Atherosclerosis (ICD10-I70.0). Electronically Signed   By: Misty Stanley M.D.   On: 04/27/2022 09:03    Microbiology: Results for orders placed or performed during the hospital encounter of 04/27/22  Urine Culture     Status: Abnormal   Collection Time: 04/27/22  7:53 AM   Specimen: Urine, Clean Catch  Result Value Ref Range Status   Specimen Description   Final    URINE, CLEAN CATCH Performed at [facility], [place] [address] [place], [place] [address]    Special Requests   Final    NONE Performed at [facility], [place] [address], [place], [place] [address]    Culture >=100,000 COLONIES/mL ESCHERICHIA COLI (A)  Final   Report Status 04/29/2022 FINAL  Final   Organism ID, Bacteria ESCHERICHIA COLI (A)  Final      Susceptibility   Escherichia coli - MIC*    AMPICILLIN 4 SENSITIVE Sensitive     CEFAZOLIN <=4 SENSITIVE Sensitive     CEFEPIME <=0.12 SENSITIVE Sensitive     CEFTRIAXONE <=0.25 SENSITIVE Sensitive     CIPROFLOXACIN <=0.25 SENSITIVE Sensitive     GENTAMICIN <=1 SENSITIVE Sensitive     IMIPENEM <=0.25 SENSITIVE Sensitive     NITROFURANTOIN <=16 SENSITIVE Sensitive     TRIMETH/SULFA <=20 SENSITIVE Sensitive     AMPICILLIN/SULBACTAM <=2 SENSITIVE Sensitive     PIP/TAZO <=4 SENSITIVE Sensitive     * >=100,000 COLONIES/mL ESCHERICHIA COLI  Culture, blood (Routine X 2) w Reflex to ID Panel     Status: None   Collection Time: 04/27/22  1:50 PM   Specimen: BLOOD  Result Value Ref Range Status   Specimen Description   Final    BLOOD RIGHT ANTECUBITAL Performed at [place] [address], [place], [place] [address]    Special Requests   Final    BOTTLES DRAWN AEROBIC AND ANAEROBIC Blood Culture adequate volume Performed at [place] [address], [place], [place] [address]    Culture   Final    NO GROWTH 5 DAYS Performed at [facility], [place] [address] [place],  [address]    Report Status 05/02/2022 FINAL  Final  Culture, blood (Routine X 2) w Reflex to ID Panel     Status: None  Collection Time: 04/27/22  9:10 PM   Specimen: BLOOD LEFT ARM  Result Value Ref Range Status   Specimen Description   Final    BLOOD LEFT ARM Performed at [facility], [place] [address] [place], [place] [address]    Special Requests   Final    BOTTLES DRAWN AEROBIC AND ANAEROBIC Blood Culture adequate volume Performed at [place] [address] [place], [place] [address]     Culture   Final    NO GROWTH 5 DAYS Performed at [facility], [place] [address] [place], [place] [address]    Report Status 05/02/2022 FINAL  Final  Aerobic/Anaerobic Culture w Gram Stain (surgical/deep wound)     Status: None (Preliminary result)   Collection Time: 05/03/22  5:00 PM   Specimen: Abscess  Result Value Ref Range Status   Specimen Description ABSCESS  Final   Special Requests ABDOMEN  Final   Gram Stain   Final    ABUNDANT WBC PRESENT,BOTH PMN AND MONONUCLEAR RARE GRAM POSITIVE COCCI IN PAIRS    Culture   Final    NO GROWTH 2 DAYS NO ANAEROBES ISOLATED; CULTURE IN PROGRESS FOR 5 DAYS Performed at [facility], [place] [address], [place], [place] [address]    Report Status PENDING  Incomplete    Labs: CBC: Recent Labs  Lab 04/29/22 0427 04/30/22 0543 05/02/22 0442 05/04/22 0434 05/05/22 0442  WBC 17.6* 15.2* 14.5* 10.3 10.1  HGB 9.4* 9.2* 9.2* 9.0* 9.0*  HCT 31.0* 31.0* 30.8* 30.2* 30.4*  MCV 76.5* 77.7* 77.2* 76.3* 77.6*  PLT 481* 463* 505* 506* 123XX123*   Basic Metabolic Panel: Recent Labs  Lab 04/29/22 0427 04/30/22 0543 05/04/22 0434 05/05/22 0442  NA 139 138 139 139  K 3.7 3.6 3.3* 3.4*  CL 109 109 104 108  CO2 23 24 28 26   GLUCOSE 158* 138* 114* 143*  BUN 22 18 14 18   CREATININE 0.95 0.92 0.81 0.81  CALCIUM 9.1 8.9 9.2 9.1  MG  --  2.2  --   --    Liver Function Tests: Recent Labs  Lab 05/04/22 0434 05/05/22 0442  AST 16 14*  ALT 18 14  ALKPHOS 80 75  BILITOT 0.6 0.5  PROT 6.0* 5.7*  ALBUMIN 2.3* 2.2*   CBG: No results for input(s): "GLUCAP" in the last 168 hours.  Discharge time spent: less than 30 minutes.  Signed: Marylu Lund, MD [facility] 05/05/2022

## 2022-05-05 NOTE — Progress Notes (Signed)
  Transition of Care [facility]) Screening Note   Patient Details  Name: Heidi Peterson Date of Birth: 03-May-1951   Transition of Care [facility]) CM/SW Contact:    Lanier Clam, RN Phone Number: 05/05/2022, 2:48 PM    Transition of Care Department [facility]) has reviewed patient and no TOC needs have been identified at this time. We will continue to monitor patient advancement through interdisciplinary progression rounds. If new patient transition needs arise, please place a TOC consult.

## 2022-05-06 ENCOUNTER — Other Ambulatory Visit (HOSPITAL_COMMUNITY): Payer: Self-pay | Admitting: Radiology

## 2022-05-06 ENCOUNTER — Ambulatory Visit (HOSPITAL_COMMUNITY)
Admission: RE | Admit: 2022-05-06 | Discharge: 2022-05-06 | Disposition: A | Discharge status: Home / Self Care | Payer: Medicare Other | Source: Ambulatory Visit | Attending: Radiology | Admitting: Radiology

## 2022-05-06 DIAGNOSIS — Z4803 Encounter for change or removal of drains: Secondary | ICD-10-CM | POA: Diagnosis present

## 2022-05-06 DIAGNOSIS — N739 Female pelvic inflammatory disease, unspecified: Secondary | ICD-10-CM

## 2022-05-06 HISTORY — PX: IR CATHETER TUBE CHANGE: IMG717

## 2022-05-06 MED ORDER — IOHEXOL 300 MG/ML  SOLN
50.0000 mL | Freq: Once | INTRAMUSCULAR | Status: AC | PRN
  Administered 2022-05-06: 10 mL

## 2022-05-06 MED ORDER — LIDOCAINE HCL 1 % IJ SOLN
INTRAMUSCULAR | Status: AC
  Filled 2022-05-06: qty 20

## 2022-05-06 NOTE — Procedures (Signed)
Interventional Radiology Procedure Note  Procedure: fluoro left TG abscess drain exchg    Complications: None  Estimated Blood Loss:  min  Findings: 10 fr drain exchg'd    Sharen Counter, MD

## 2022-05-08 LAB — AEROBIC/ANAEROBIC CULTURE W GRAM STAIN (SURGICAL/DEEP WOUND): Culture: NO GROWTH

## 2022-05-16 ENCOUNTER — Other Ambulatory Visit (HOSPITAL_COMMUNITY): Payer: Self-pay | Admitting: Radiology

## 2022-05-16 DIAGNOSIS — N739 Female pelvic inflammatory disease, unspecified: Secondary | ICD-10-CM

## 2022-05-20 ENCOUNTER — Ambulatory Visit (HOSPITAL_COMMUNITY)
Admission: RE | Admit: 2022-05-20 | Discharge: 2022-05-20 | Disposition: A | Discharge status: Home / Self Care | Payer: Medicare Other | Source: Ambulatory Visit | Attending: Radiology | Admitting: Radiology

## 2022-05-20 ENCOUNTER — Other Ambulatory Visit (HOSPITAL_COMMUNITY): Payer: Self-pay | Admitting: Radiology

## 2022-05-20 DIAGNOSIS — I10 Essential (primary) hypertension: Secondary | ICD-10-CM | POA: Insufficient documentation

## 2022-05-20 DIAGNOSIS — N739 Female pelvic inflammatory disease, unspecified: Secondary | ICD-10-CM | POA: Insufficient documentation

## 2022-05-20 DIAGNOSIS — N1330 Unspecified hydronephrosis: Secondary | ICD-10-CM | POA: Insufficient documentation

## 2022-05-20 MED ORDER — IOHEXOL 350 MG/ML SOLN
75.0000 mL | Freq: Once | INTRAVENOUS | Status: AC | PRN
  Administered 2022-05-20: 75 mL via INTRAVENOUS

## 2022-05-20 MED ORDER — IOHEXOL 300 MG/ML  SOLN
5.0000 mL | Freq: Once | INTRAMUSCULAR | Status: AC | PRN
  Administered 2022-05-20: 5 mL

## 2022-05-20 NOTE — Progress Notes (Addendum)
Referring Physician(s): Dr. Marin Olp  Chief Complaint: The patient is seen in follow up today s/p pelvic abscess drain placement  History of present illness:  Heidi Peterson is a 71 y.o. female outpatient. History of HTN, Appendicitis s/p lap converted to open ileocecectomy on 2.20.23 Found to have a post op pelvic abscess on.   IR placed a pelvic drain on 2.8.23 via  left transgluteal approach on 2.28.23   The drain was removed on 3.8.23.   The pelvic abscess recurred and the drain was replaced on 11.14.23 (10 Fr).   Cultures showed no growthDrain exchanged on 11.17.23 due to malfunction.   She has completed her antibiotic therapy on (Augmentin) 11.21.23.    She presents today for follow-up CT scan and drain evaluation.    She is currently flushing the drain once daily with 5 mL of saline.   Output has been minimal, averaging about 5 mL/day.   Endorses what she describes as "kidney Pain" that she states is related to her kidney stones.   She currently denies fever, headache, chest pain, dyspnea, cough, abdominal pain, nausea, vomiting or bleeding.    Endorses what she describes as "kidney Pain" that she states is related to her kidney stones.   Patient is being followed by [facility] and is scheduled for follow up on December 11th.   Past Medical History:  Diagnosis Date   Kidney stones     Past Surgical History:  Procedure Laterality Date   CYSTOSCOPY W/ URETERAL STENT PLACEMENT Bilateral 04/27/2022   Procedure: CYSTOSCOPY WITH RETROGRADE PYELOGRAM/URETERAL STENT PLACEMENT;  Surgeon: Crista Elliot, MD;  Location: [facility];  Service: Urology;  Laterality: Bilateral;   IR CATHETER TUBE CHANGE  05/06/2022   IR RADIOLOGIST EVAL & MGMT  08/25/2021   LAPAROSCOPIC APPENDECTOMY N/A 08/09/2021   Procedure: LAPAROSCOPIC CONVERTED TO OPEN ILEOSEECTOMY;  Surgeon: Andria Meuse, MD;  Location: [facility];  Service: General;  Laterality: N/A;    Allergies: Toradol [ketorolac  tromethamine]  Medications: Prior to Admission medications   Medication Sig Start Date End Date Taking? Authorizing Provider  acetaminophen (TYLENOL) 500 MG tablet Take 1,000 mg by mouth every 8 (eight) hours as needed (pain).    [provider]  acetaminophen-codeine (TYLENOL #3) 300-30 MG tablet Take 1 tablet by mouth every 4 (four) hours as needed. 01/21/22   [provider]  BIOTIN PO Take 1 tablet by mouth every morning.    [provider]  Cholecalciferol (VITAMIN D-3) 125 MCG (5000 UT) TABS Take 2 tablets by mouth daily.    [provider]  Glucosamine-Chondroitin (OSTEO BI-FLEX REGULAR STRENGTH PO) Take 2 tablets by mouth daily.    [provider]  HYDROcodone-acetaminophen (NORCO/VICODIN) 5-325 MG tablet Take 1 tablet by mouth every 6 (six) hours as needed for severe pain. 05/05/22   Jerald Kief, MD  lactobacillus acidophilus (BACID) TABS tablet Take 1 tablet by mouth daily. Probiotic with apple cider vinegar    [provider]  lisinopril (ZESTRIL) 40 MG tablet Take 40 mg by mouth every morning.    [provider]  methocarbamol (ROBAXIN) 500 MG tablet Take 1 tablet (500 mg total) by mouth every 6 (six) hours as needed for muscle spasms. 08/19/21   Barnetta Chapel, PA-C  metoprolol succinate (TOPROL-XL) 50 MG 24 hr tablet Take 50 mg by mouth every morning. Take with or immediately following a meal.    [provider]  Multiple Vitamin (MULTIVITAMIN WITH MINERALS) TABS tablet Take 1 tablet  by mouth every morning. Patient not taking: Reported on 04/27/2022    [provider]  naproxen sodium (ALEVE) 220 MG tablet Take 220 mg by mouth every 8 (eight) hours as needed (pain).    [provider]  Omega-3 Fatty Acids (OMEGA-3 PO) Take 1 capsule by mouth every morning.    [provider]  PRESCRIPTION MEDICATION Inhale into the lungs See admin instructions. CPAP - use at night and whenever  resting Patient not taking: Reported on 04/27/2022    [provider]     No family history on file.  Social History   Socioeconomic History   Marital status: Married    Spouse name: Not on file   Number of children: Not on file   Years of education: Not on file   Highest education level: Not on file  Occupational History   Not on file  Tobacco Use   Smoking status: Never   Smokeless tobacco: Never  Substance and Sexual Activity   Alcohol use: Not Currently    Alcohol/week: 1.0 standard drink of alcohol    Types: 1 Glasses of wine per week    Comment: 1 glass a month   Drug use: Never   Sexual activity: Not on file  Other Topics Concern   Not on file  Social History Narrative   Not on file   Social Determinants of Health   Financial Resource Strain: Not on file  Food Insecurity: No Food Insecurity (04/28/2022)   Hunger Vital Sign    Worried About Running Out of Food in the Last Year: Never true    Ran Out of Food in the Last Year: Never true  Transportation Needs: No Transportation Needs (04/28/2022)   PRAPARE - Administrator, Civil Service (Medical): No    Lack of Transportation (Non-Medical): No  Physical Activity: Not on file  Stress: Not on file  Social Connections: Not on file     Vital Signs: There were no vitals taken for this visit.  Physical Exam Vitals reviewed.  Constitutional:      Appearance: Normal appearance.  Pulmonary:     Effort: Pulmonary effort is normal. No respiratory distress.  Neurological:     General: No focal deficit present.     Mental Status: She is alert and oriented to person, place, and time.  Psychiatric:        Mood and Affect: Mood normal.        Behavior: Behavior normal.        Thought Content: Thought content normal.        Judgment: Judgment normal.     Imaging: No results found.  Labs:  CBC: Recent Labs    04/30/22 0543 05/02/22 0442 05/04/22 0434 05/05/22 0442  WBC 15.2* 14.5*  10.3 10.1  HGB 9.2* 9.2* 9.0* 9.0*  HCT 31.0* 30.8* 30.2* 30.4*  PLT 463* 505* 506* 474*    COAGS: Recent Labs    08/17/21 1010 04/28/22 0431  INR 1.0 1.2    BMP: Recent Labs    04/29/22 0427 04/30/22 0543 05/04/22 0434 05/05/22 0442  NA 139 138 139 139  K 3.7 3.6 3.3* 3.4*  CL 109 109 104 108  CO2 23 24 28 26   GLUCOSE 158* 138* 114* 143*  BUN 22 18 14 18   CALCIUM 9.1 8.9 9.2 9.1  CREATININE 0.95 0.92 0.81 0.81  GFRNONAA >60 >60 >60 >60    LIVER FUNCTION TESTS: Recent Labs    04/27/22 0753 04/28/22  9191 05/04/22 0434 05/05/22 0442  BILITOT 0.6 0.6 0.6 0.5  AST 18 17 16  14*  ALT 17 18 18 14   ALKPHOS 85 77 80 75  PROT 7.2 7.3 6.0* 5.7*  ALBUMIN 3.2* 3.0* 2.3* 2.2*    Assessment/Plan:  Recurrent pelvic abscess. Drain was replaced on 11.14.23 (10 Fr).   Drain exchanged on 11.17.23 due to malfunction.   She has completed her antibiotic therapy on (Augmentin) 11.21.23.    CT scan showed resolution of the abscess.  Drain injection showed no fistula connection to the colon.  Images reviewed by Dr. 11.19.23 and by Dr. 11.23.23. Both agree the drain can be removed today.  Drain removed without difficulty. Clean dressing placed. She can remove the dressing tomorrow and shower.  Keep upcoming appointments with Urology and Surgery.   Nicko Daher S Arita Severtson PA-C 05/20/2022 4:24 PM      I spent a total of 25 Minutes at the the patient's bedside AND on the patient's hospital floor or unit, greater than 50% of which was counseling/coordinating care for pelvic abscess drain

## 2023-04-10 ENCOUNTER — Ambulatory Visit: Payer: Medicare Other | Admitting: Podiatry

## 2023-04-19 ENCOUNTER — Ambulatory Visit (INDEPENDENT_AMBULATORY_CARE_PROVIDER_SITE_OTHER): Payer: Medicare Other | Admitting: Podiatry

## 2023-04-19 ENCOUNTER — Encounter: Payer: Self-pay | Admitting: Podiatry

## 2023-04-19 VITALS — Ht 62.5 in | Wt 225.0 lb

## 2023-04-19 DIAGNOSIS — L600 Ingrowing nail: Secondary | ICD-10-CM | POA: Diagnosis not present

## 2023-04-19 NOTE — Progress Notes (Signed)
Subjective:  Patient ID: Heidi Peterson, female    DOB: [birth year],   MRN: [id number]  Chief Complaint  Patient presents with   Ingrown Toenail    New Patient, Pt has ingrown on greater right toe    72 y.o. female presents for concern of right great ingrown toenail. Relates she did have an injury a while back and broke her knee but also jammed her toe and since then has had irritation with the toe. It did get infected but was on some antibiotics from her PCP and has done better. It is still sore however . Denies any other pedal complaints. Denies n/v/f/c.   Past Medical History:  Diagnosis Date   Kidney stones     Objective:  Physical Exam: Vascular: DP/PT pulses 2/4 bilateral. CFT <3 seconds. Normal hair growth on digits. No edema.  Skin. No lacerations or abrasions bilateral feet. Incurvation of lateral border of right great toenail with tenderness to palpation. No erythema edema or purulence noted.  Thickening noted to left great toenail as well no pain to palpation.  Musculoskeletal: MMT 5/5 bilateral lower extremities in DF, PF, Inversion and Eversion. Deceased ROM in DF of ankle joint.  Neurological: Sensation intact to light touch.   Assessment:   1. Ingrown nail of great toe of right foot      Plan:  Patient was evaluated and treated and all questions answered. Discussed ingrown toenails etiology and treatment options including procedure for removal vs conservative care.  Patient requesting removal of ingrown nail today. Procedure below.  Discussed procedure and post procedure care and patient expressed understanding.  Will follow-up in 2 weeks for nail check or sooner if any problems arise.    Procedure:  Procedure: partial Nail Avulsion of right hallux lateral nail border.  Surgeon: Louann Sjogren, DPM  Pre-op Dx: Ingrown toenail without infection Post-op: Same  Place of Surgery: Office exam room.  Indications for surgery: Painful and ingrown toenail.     The patient is requesting removal of nail with  chemical matrixectomy. Risks and complications were discussed with the patient for which they understand and written consent was obtained. Under sterile conditions a total of 3 mL of  1% lidocaine plain was infiltrated in a hallux block fashion. Once anesthetized, the skin was prepped in sterile fashion. A tourniquet was then applied. Next the lateral aspect of hallux nail border was then sharply excised making sure to remove the entire offending nail border.  Next phenol was then applied under standard conditions to permanently destroy the matrix and copiously irrigated. Silvadene was applied. A dry sterile dressing was applied. After application of the dressing the tourniquet was removed and there is found to be an immediate capillary refill time to the digit. The patient tolerated the procedure well without any complications. Post procedure instructions were discussed the patient for which he verbally understood. Follow-up in two weeks for nail check or sooner if any problems are to arise. Discussed signs/symptoms of infection and directed to call the office immediately should any occur or go directly to the emergency room. In the meantime, encouraged to call the office with any questions, concerns, changes symptoms.   Louann Sjogren, DPM

## 2023-04-19 NOTE — Patient Instructions (Signed)

## 2023-05-03 ENCOUNTER — Ambulatory Visit (INDEPENDENT_AMBULATORY_CARE_PROVIDER_SITE_OTHER): Payer: Medicare Other | Admitting: Podiatry

## 2023-05-03 DIAGNOSIS — L600 Ingrowing nail: Secondary | ICD-10-CM

## 2023-05-03 NOTE — Progress Notes (Signed)
  Subjective:  Patient ID: Heidi Peterson, female    DOB: 08/25/1950,   MRN: [id number]  No chief complaint on file.   72 y.o. female presents for follow-up of right ingrown nail. Relates doing well with no issues some tenderness on medial border.  . Denies any other pedal complaints. Denies n/v/f/c.   Past Medical History:  Diagnosis Date   Kidney stones     Objective:  Physical Exam: Vascular: DP/PT pulses 2/4 bilateral. CFT <3 seconds. Normal hair growth on digits. No edema.  Skin. No lacerations or abrasions bilateral feet. Right hallux nail healing well. Medial border with some incurvation.  Musculoskeletal: MMT 5/5 bilateral lower extremities in DF, PF, Inversion and Eversion. Deceased ROM in DF of ankle joint.  Neurological: Sensation intact to light touch.   Assessment:   1. Ingrown nail of great toe of right foot      Plan:  Patient was evaluated and treated and all questions answered. Toe was evaluated and appears to be healing well.  Medial border debrided as courtesy.  May discontinue soaks and neosporin.  Patient to follow-up as needed.    Louann Sjogren, DPM

## 2023-11-03 IMAGING — RF DG SINUS / FISTULA TRACT / ABSCESSOGRAM
2 series · 8 of 8 positions shown · non-contrast
Comparison: none

INDICATION: Pelvic abscess status post percutaneous drain, outpatient follow-up

[Series 1: sequence · 4 of 48 frames shown (1 of 2)]
[frame 8/48]
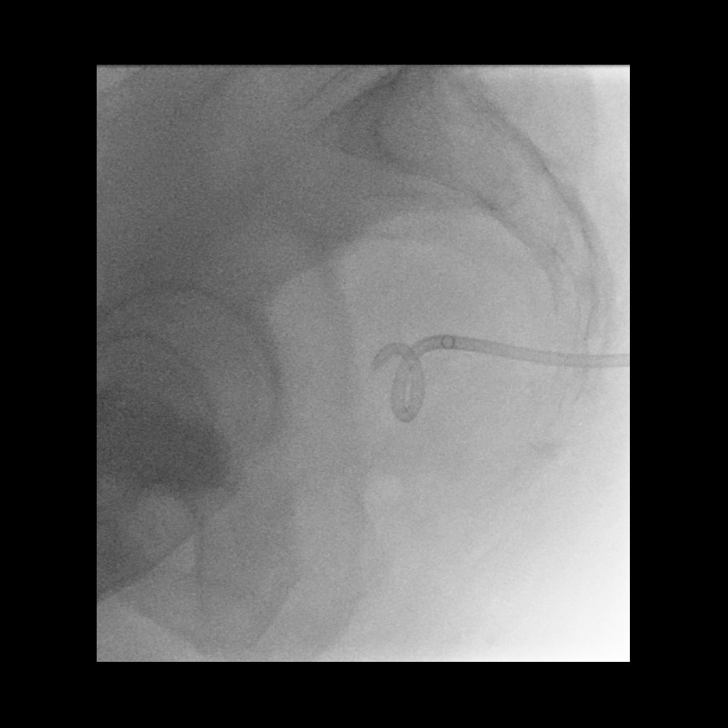
[frame 24/48]
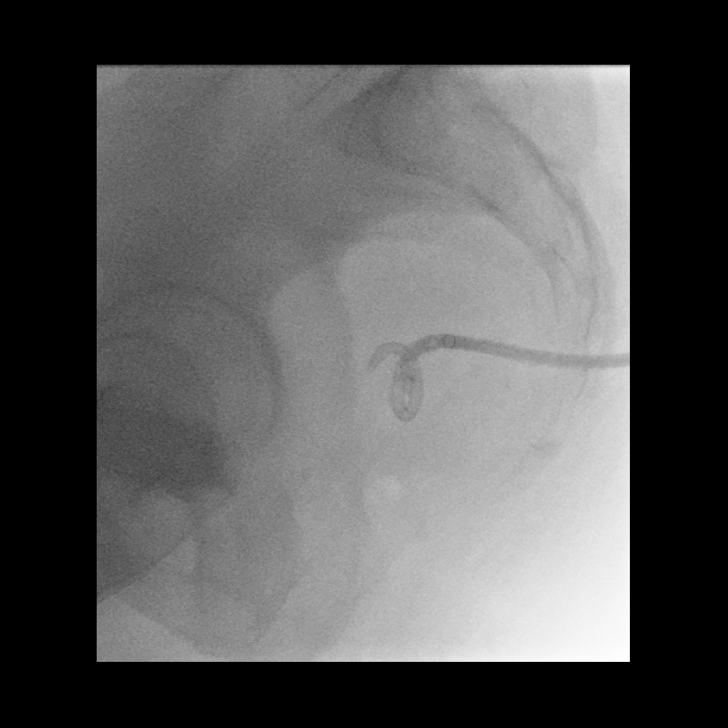
[frame 25/48]
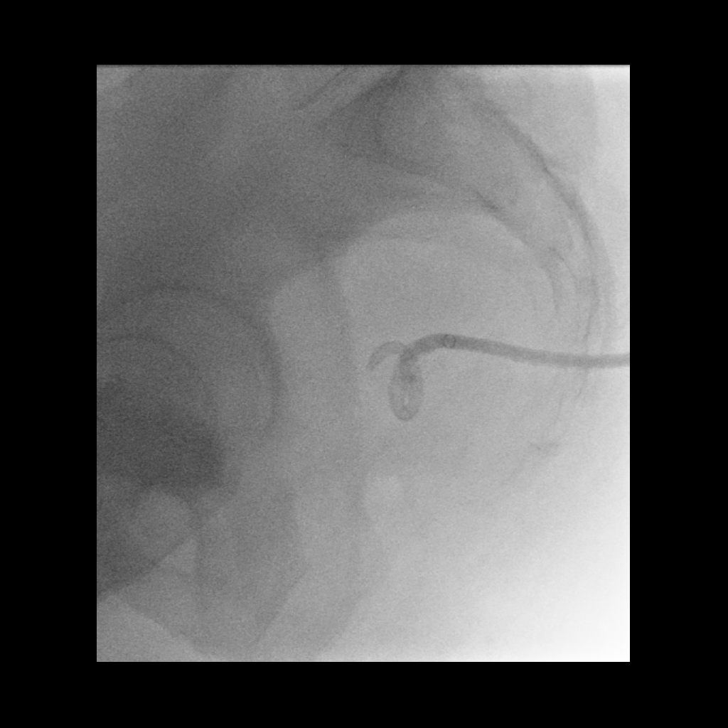
[frame 41/48]
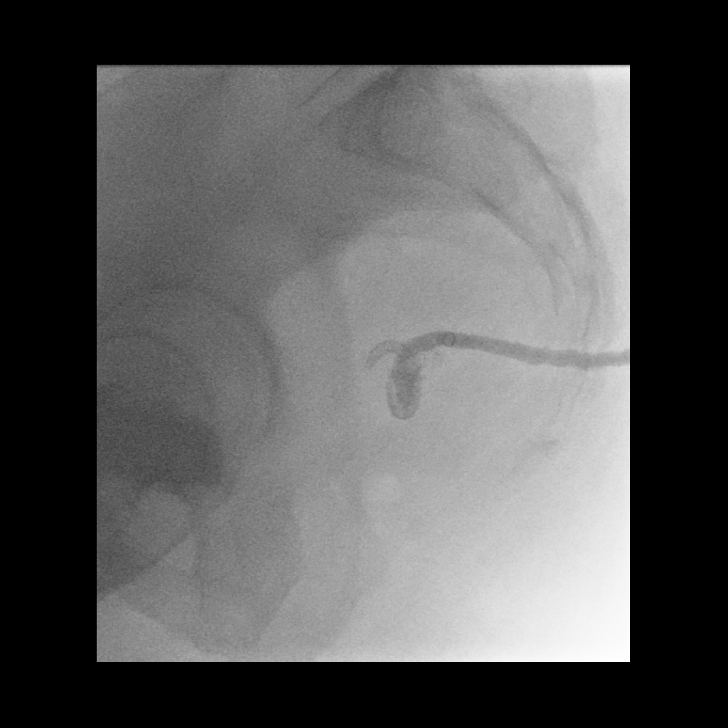

[Series 2: sequence · 4 of 19 frames shown (2 of 2)]
[frame 1/19]
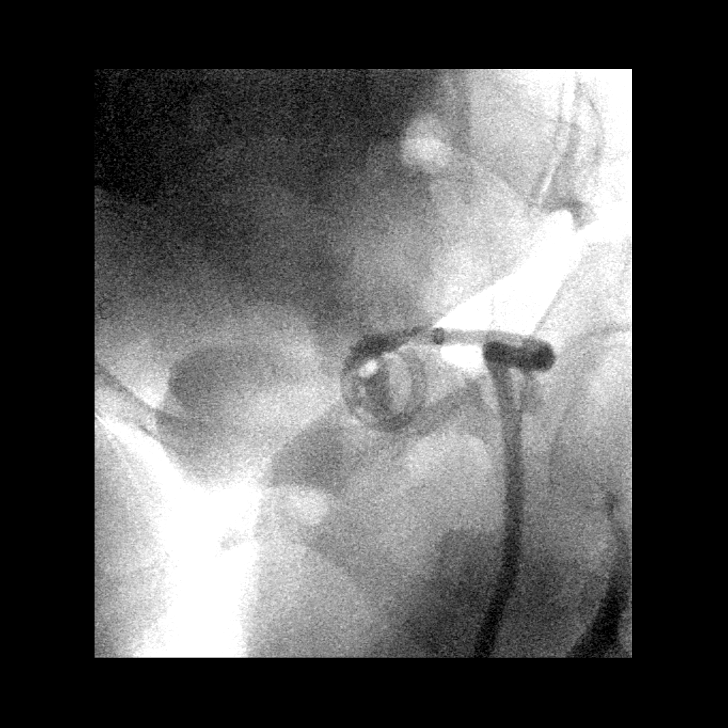
[frame 3/19]
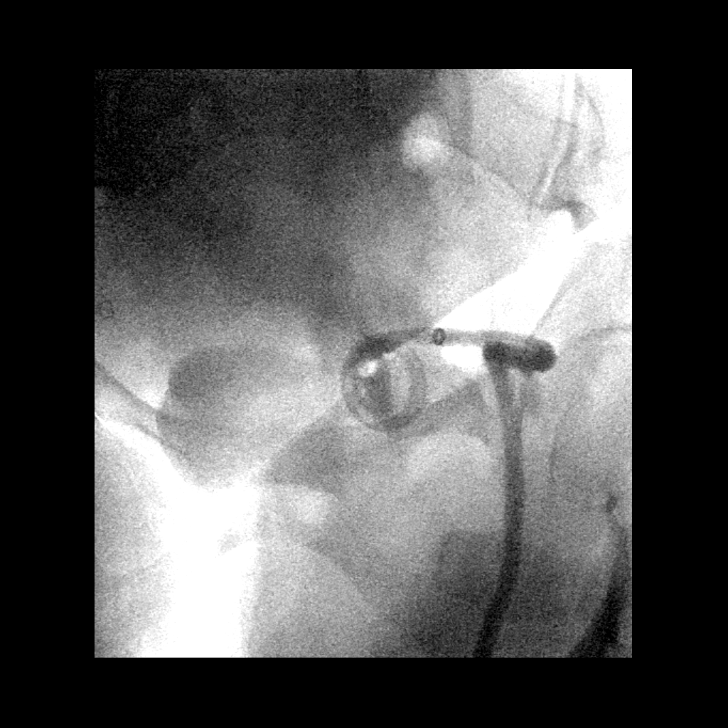
[frame 10/19]
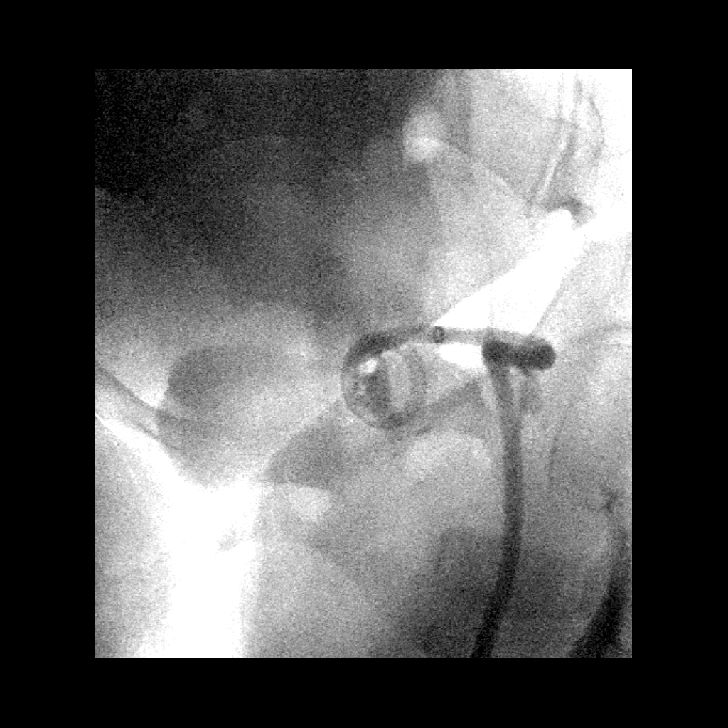
[frame 17/19]
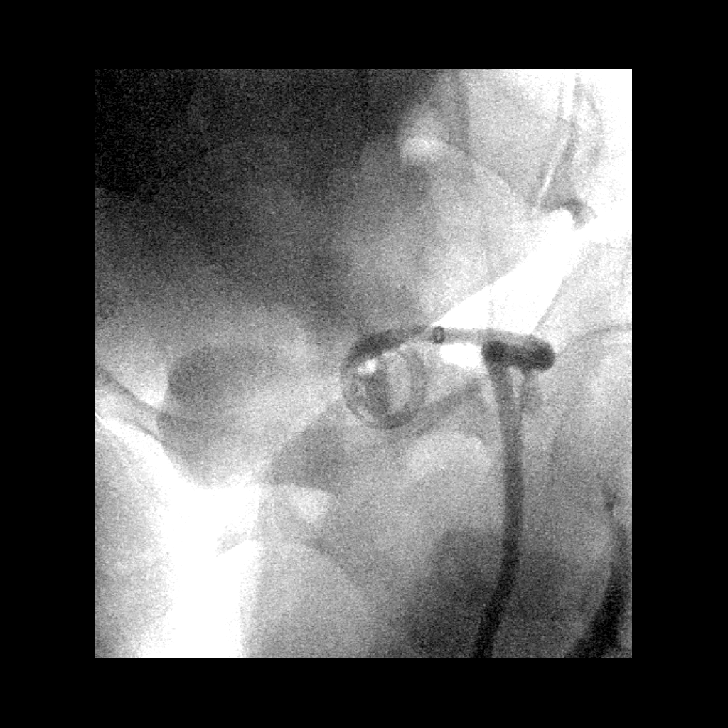

[8 of 8 positions shown; findings below may reference images not displayed]

EXAM:
FLUOROSCOPIC INJECTION OF THE TRANS GLUTEAL PELVIC ABSCESS DRAIN

MEDICATIONS:
NONE.

ANESTHESIA/SEDATION:
None.

COMPLICATIONS:
None immediate.

PROCEDURE:
Informed written consent was obtained from the patient after a
thorough discussion of the procedural risks, benefits and
alternatives. All questions were addressed. Maximal Sterile Barrier
Technique was utilized including caps, mask, sterile gowns, sterile
gloves, sterile drape, hand hygiene and skin antiseptic. A timeout
was performed prior to the initiation of the procedure.

Existing left trans gluteal pelvic abscess drain was injected under
fluoroscopy. Fluoroscopic imaging obtained. Small collapsed abscess
cavity. Negative for fistula to any adjacent bowel, uterus, bladder,
or vascular structure.
IMPRESSION: Collapsed pelvic abscess cavity.  Negative for fistula.

PLAN:
Drain catheter will be removed today.

## 2023-11-03 IMAGING — CT CT ABD-PELV W/ CM
2 of 4 series · 12 of 46 positions shown, 14 images · IV contrast (agent unspecified)
Comparison: 08/16/2021

CLINICAL DATA: Perforated appendicitis status post open
ileocecectomy with ileo colic anastomosis for acute perforated
appendicitis with feculent peritonitis

EXAM:
CT ABDOMEN AND PELVIS WITH CONTRAST
TECHNIQUE: Multidetector CT imaging of the abdomen and pelvis was performed
using the standard protocol following bolus administration of
intravenous contrast.

[Series 2: abd pelvis 5.00 br40 s3 axial · axial · 0.71mm/px · z∈[+1203,+1603]mm · 9 of 96 slices shown, 11 images]
[im 8/96  soft-tissue]
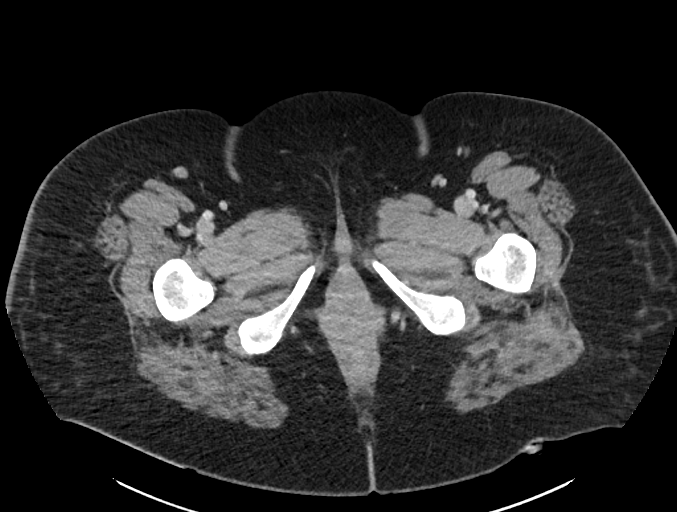
[im 8/96  bone]
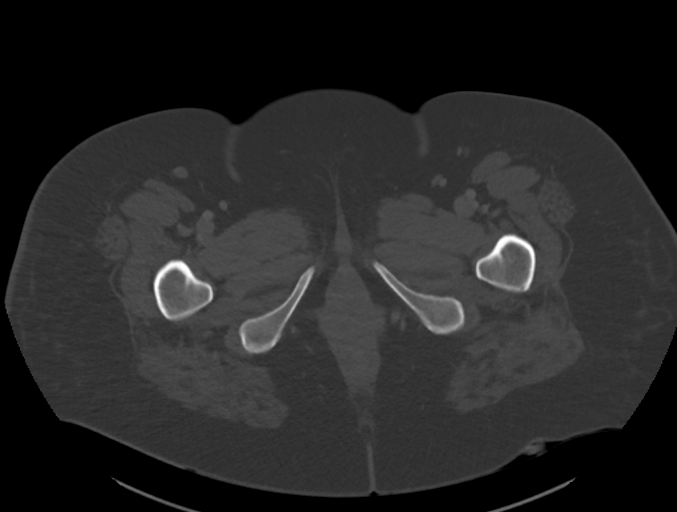
[im 16/96  soft-tissue]
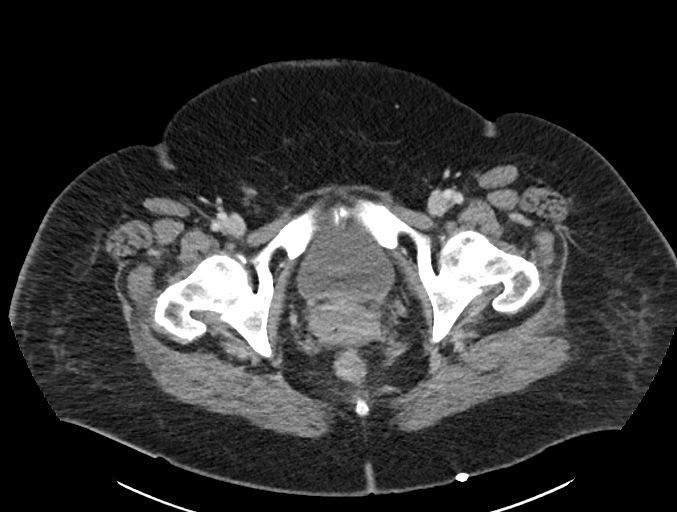
[im 28/96  soft-tissue]
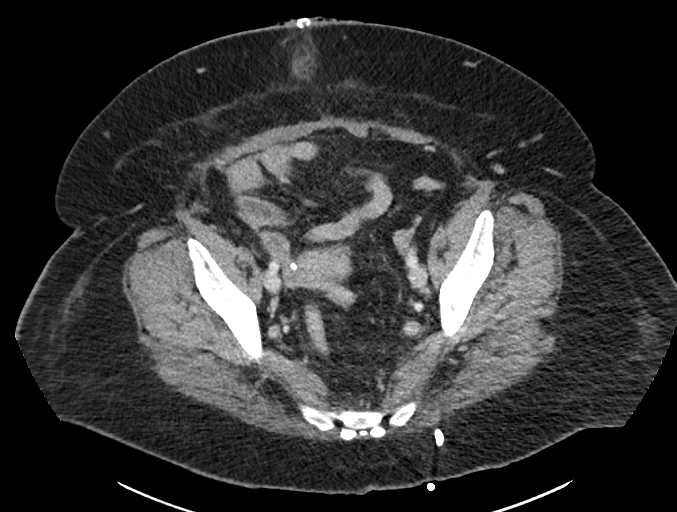
[im 36/96  soft-tissue]
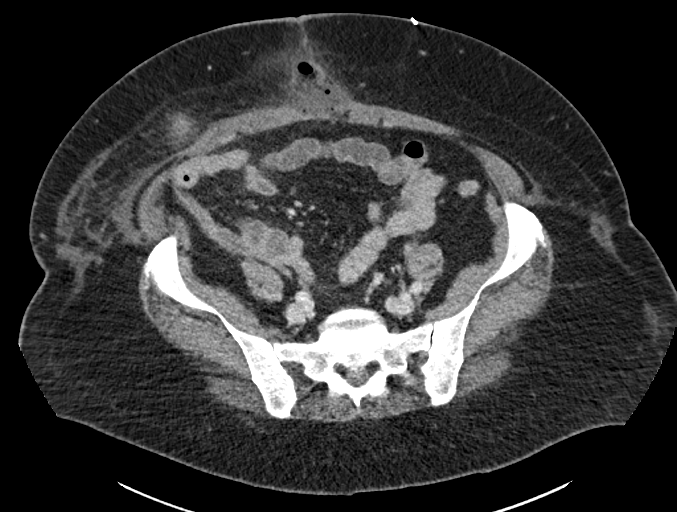
[im 48/96  soft-tissue]
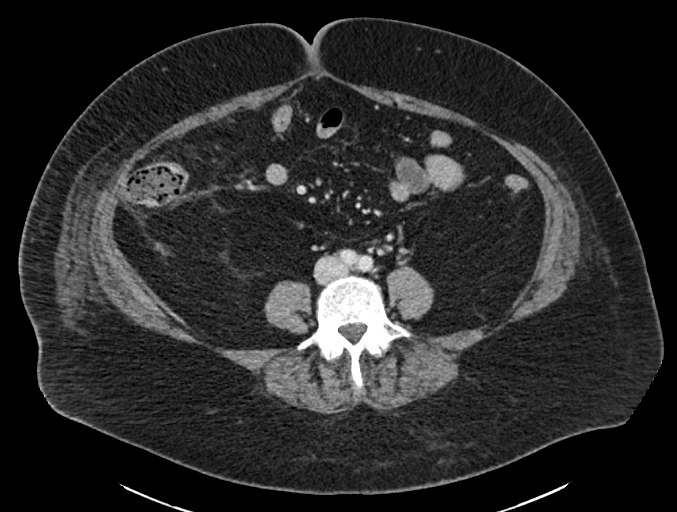
[im 60/96  soft-tissue]
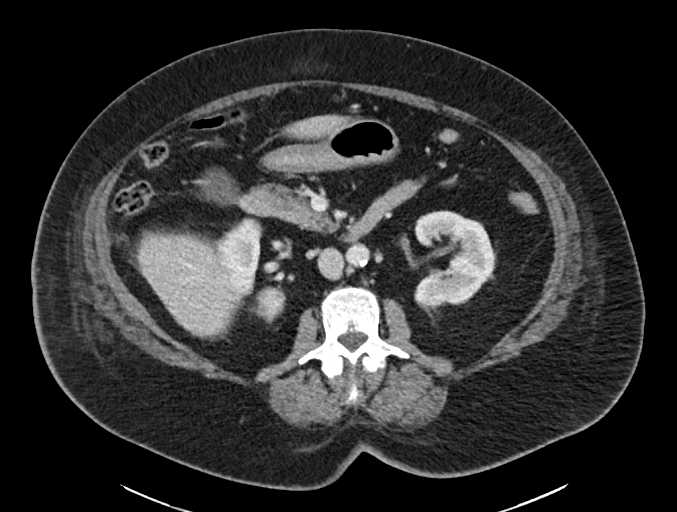
[im 68/96  soft-tissue]
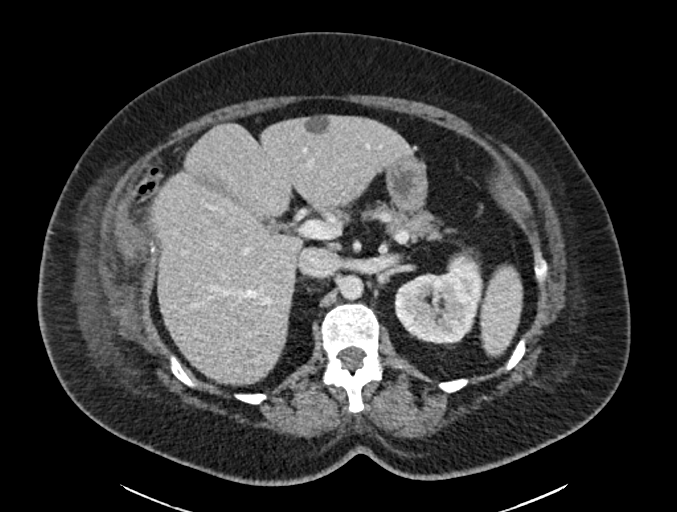
[im 80/96  soft-tissue]
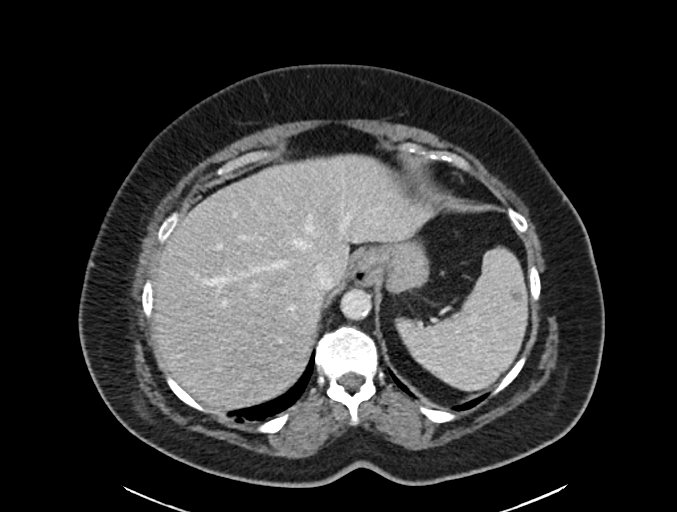
[im 88/96  soft-tissue]
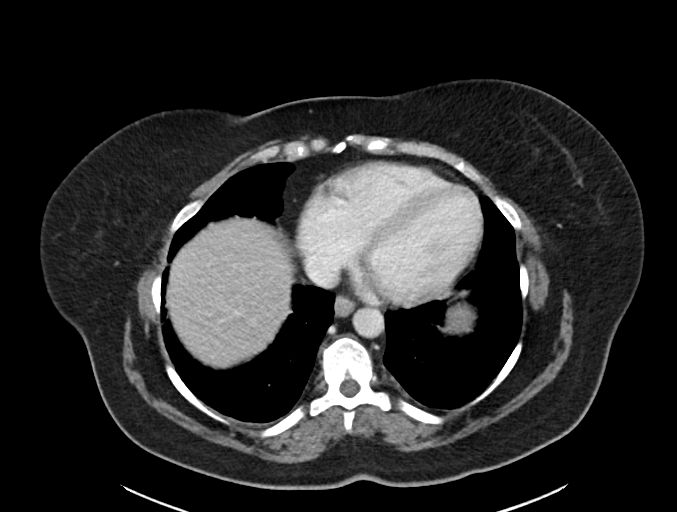
[im 88/96  bone]
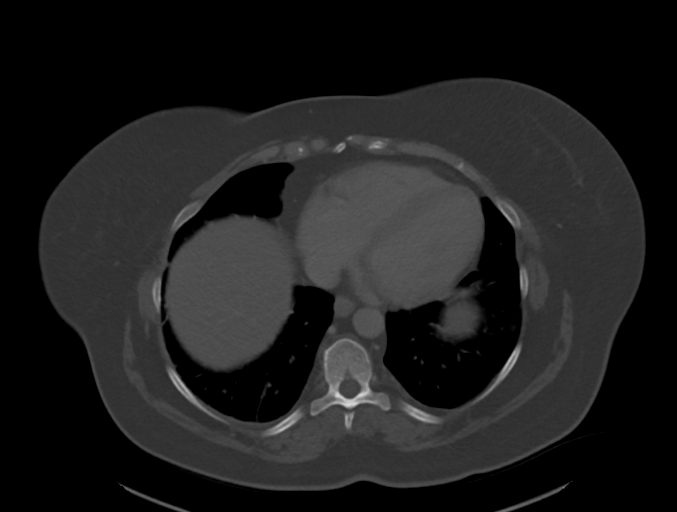

[Series 6: abd pelvis 2.00 br40 s3 cor · coronal · 0.94mm/px · 3 of 182 slices shown]
[im 61/182  soft-tissue]
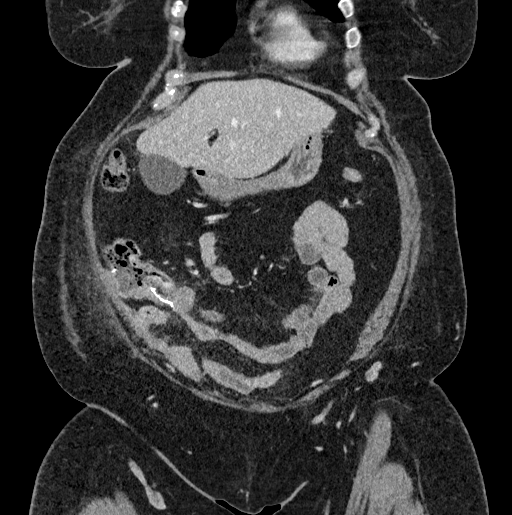
[im 81/182  soft-tissue]
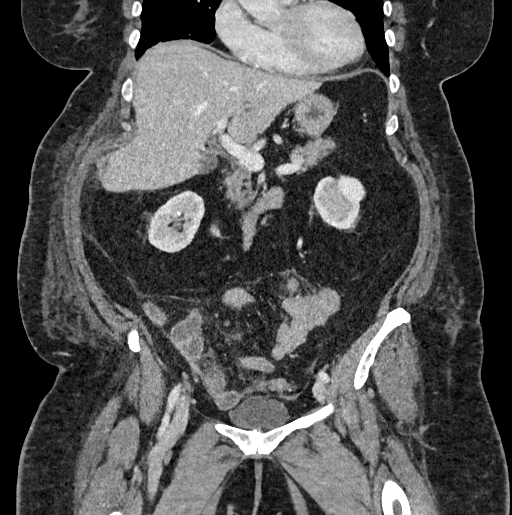
[im 101/182  soft-tissue]
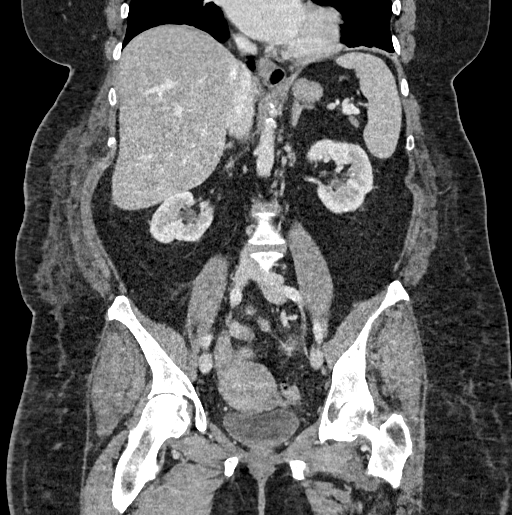

[12 of 46 positions shown; findings below may reference images not displayed]

RADIATION DOSE REDUCTION: This exam was performed according to the
departmental dose-optimization program which includes automated
exposure control, adjustment of the mA and/or kV according to
patient size and/or use of iterative reconstruction technique.

CONTRAST:  100mL DGKXGN-AMM IOPAMIDOL (DGKXGN-AMM) INJECTION 61%
FINDINGS: Lower chest: Minor residual scattered basilar atelectasis. Small
effusions have resolved. Normal heart size. No pericardial effusion.

Hepatobiliary: Stable 1.8 cm anterior lateral left hepatic
subcapsular cyst. Gallbladder and biliary tree unremarkable. No
focal hepatic abnormality.

Trace right lateral liver subcapsular fluid measures 15 mm, image
32.

Pancreas: Unremarkable. No pancreatic ductal dilatation or
surrounding inflammatory changes.

Spleen: Stable peripheral subcapsular splenic hypodensity measuring
8 mm. No other splenic abnormality. Spleen normal in size. No
surrounding free fluid.

Adrenals/Urinary Tract: Normal adrenal glands. Stable 2.3 cm
anterior mid pole left renal cyst.

Radiopaque subcentimeter nephrolithiasis again noted bilaterally.

On the left, there is a renal pelvis nonobstructing stone measuring
8 mm, image 34/2.

On the right, there are scattered subcentimeter nonobstructing
intrarenal calculi. There is also a right UPJ calculus measuring 7
mm, image 44/2. Very minimal right pelviectasis. No significant
obstructive hydronephrosis.

Ureters are both decompressed. No hydroureter. Bladder nondistended.

Stomach/Bowel: Negative for bowel obstruction, significant
dilatation, ileus, or free air. Postop changes in the right lower
quadrant. There is some residual right lower quadrant adjacent
strandy edema, presumed postoperative. No surrounding anastomotic
fluid collection.

No new abdominopelvic fluid collections.

Posterior left transgluteal drain is stable in position. Small fluid
collection at the drain site has resolved.

Vascular/Lymphatic: Aortoiliac atherosclerosis and tortuosity. No
acute vascular finding. Mesenteric and renal vasculature all appear
patent. No Duze process. No bulky adenopathy.

Reproductive: Uterus and bilateral adnexa are unremarkable.

Other: Abdominal wall midline staples noted. Midline surgical
incision ill-defined fluid and air measures 3.1 x 3.0 cm, similar to
the prior study. Suspect a component of this has drain to the skin
surface. No defined collection that warrants drainage or aspiration.

Musculoskeletal: Degenerative changes of the SI joints and
lumbosacral spine. Lower lumbar facet arthropathy. No acute osseous
finding.
IMPRESSION: Resolved pelvic cul-de-sac fluid collection or abscess at the drain
catheter site. Stable drain catheter position.

Stable postoperative findings in the right lower quadrant following
perforated appendicitis. No surrounding anastomotic fluid collection
or abnormality.

No new abdominal or pelvic fluid collections of significance. Trace
subcapsular focal fluid along the right lateral liver margin
measures only 15 mm.

Ill-defined fluid and air beneath the midline laparotomy staples in
the abdominal wall measures 3 cm. See above comment.

Bilateral nephrolithiasis as detailed above. 8 mm left renal pelvis
nonobstructing stone noted.

7 mm right UPJ calculus evident with minor proximal pelviectasis.
This stone could be partially obstructing.

Aortic Atherosclerosis (YY5CD-NY1.1).
